# Patient Record
Sex: Female | Born: 1935 | Race: White | Hispanic: No | Smoking: Never smoker
Health system: Southern US, Community
[De-identification: ages and names within clinical notes are randomized; demographics above are authoritative.]

## PROBLEM LIST (undated history)

## (undated) DIAGNOSIS — R030 Elevated blood-pressure reading, without diagnosis of hypertension: Secondary | ICD-10-CM

## (undated) DIAGNOSIS — U071 COVID-19: Secondary | ICD-10-CM

## (undated) DIAGNOSIS — C8330 Diffuse large B-cell lymphoma, unspecified site: Secondary | ICD-10-CM

## (undated) DIAGNOSIS — H409 Unspecified glaucoma: Secondary | ICD-10-CM

## (undated) DIAGNOSIS — IMO0001 Reserved for inherently not codable concepts without codable children: Secondary | ICD-10-CM

## (undated) DIAGNOSIS — Z9289 Personal history of other medical treatment: Secondary | ICD-10-CM

## (undated) DIAGNOSIS — C801 Malignant (primary) neoplasm, unspecified: Secondary | ICD-10-CM

## (undated) DIAGNOSIS — B019 Varicella without complication: Secondary | ICD-10-CM

## (undated) HISTORY — DX: Personal history of other medical treatment: Z92.89

## (undated) HISTORY — DX: Reserved for inherently not codable concepts without codable children: IMO0001

## (undated) HISTORY — DX: Malignant (primary) neoplasm, unspecified: C80.1

## (undated) HISTORY — DX: COVID-19: U07.1

## (undated) HISTORY — DX: Varicella without complication: B01.9

## (undated) HISTORY — DX: Elevated blood-pressure reading, without diagnosis of hypertension: R03.0

## (undated) HISTORY — DX: Diffuse large B-cell lymphoma, unspecified site: C83.30

## (undated) HISTORY — DX: Unspecified glaucoma: H40.9

---

## 1976-10-12 HISTORY — PX: ABDOMINAL HYSTERECTOMY: SHX81

## 1988-10-12 HISTORY — PX: BREAST BIOPSY: SHX20

## 2004-09-01 ENCOUNTER — Ambulatory Visit: Payer: Self-pay | Admitting: General Surgery

## 2005-11-19 ENCOUNTER — Ambulatory Visit: Payer: Self-pay | Admitting: General Surgery

## 2005-12-24 ENCOUNTER — Ambulatory Visit: Payer: Self-pay | Admitting: Ophthalmology

## 2005-12-30 ENCOUNTER — Ambulatory Visit: Payer: Self-pay | Admitting: Ophthalmology

## 2006-11-23 ENCOUNTER — Ambulatory Visit: Payer: Self-pay | Admitting: General Surgery

## 2007-11-28 ENCOUNTER — Ambulatory Visit: Payer: Self-pay | Admitting: Ophthalmology

## 2007-12-21 ENCOUNTER — Ambulatory Visit: Payer: Self-pay | Admitting: Ophthalmology

## 2008-01-30 ENCOUNTER — Ambulatory Visit: Payer: Self-pay | Admitting: General Surgery

## 2009-02-18 ENCOUNTER — Ambulatory Visit: Payer: Self-pay | Admitting: General Surgery

## 2009-02-22 ENCOUNTER — Ambulatory Visit: Payer: Self-pay | Admitting: Urology

## 2009-10-12 DIAGNOSIS — C801 Malignant (primary) neoplasm, unspecified: Secondary | ICD-10-CM

## 2009-10-12 HISTORY — DX: Malignant (primary) neoplasm, unspecified: C80.1

## 2009-10-12 HISTORY — PX: TRANSURETHRAL RESECTION OF BLADDER TUMOR: SHX2575

## 2009-12-13 ENCOUNTER — Ambulatory Visit: Payer: Self-pay | Admitting: Urology

## 2009-12-16 ENCOUNTER — Ambulatory Visit: Payer: Self-pay | Admitting: Cardiovascular Disease

## 2009-12-16 ENCOUNTER — Ambulatory Visit: Payer: Self-pay | Admitting: Urology

## 2009-12-17 ENCOUNTER — Ambulatory Visit: Payer: Self-pay | Admitting: Urology

## 2010-05-13 ENCOUNTER — Ambulatory Visit: Payer: Self-pay | Admitting: Urology

## 2010-05-27 ENCOUNTER — Ambulatory Visit: Payer: Self-pay | Admitting: Urology

## 2012-11-01 DIAGNOSIS — R339 Retention of urine, unspecified: Secondary | ICD-10-CM | POA: Insufficient documentation

## 2012-11-01 DIAGNOSIS — N3941 Urge incontinence: Secondary | ICD-10-CM | POA: Insufficient documentation

## 2012-11-01 DIAGNOSIS — N182 Chronic kidney disease, stage 2 (mild): Secondary | ICD-10-CM | POA: Insufficient documentation

## 2012-11-01 DIAGNOSIS — Q644 Malformation of urachus: Secondary | ICD-10-CM | POA: Insufficient documentation

## 2012-11-01 DIAGNOSIS — N200 Calculus of kidney: Secondary | ICD-10-CM | POA: Insufficient documentation

## 2012-11-01 DIAGNOSIS — R3129 Other microscopic hematuria: Secondary | ICD-10-CM | POA: Insufficient documentation

## 2012-11-01 DIAGNOSIS — R300 Dysuria: Secondary | ICD-10-CM | POA: Insufficient documentation

## 2012-11-01 DIAGNOSIS — N390 Urinary tract infection, site not specified: Secondary | ICD-10-CM | POA: Insufficient documentation

## 2012-11-01 DIAGNOSIS — C672 Malignant neoplasm of lateral wall of bladder: Secondary | ICD-10-CM | POA: Insufficient documentation

## 2012-11-01 DIAGNOSIS — A4902 Methicillin resistant Staphylococcus aureus infection, unspecified site: Secondary | ICD-10-CM | POA: Insufficient documentation

## 2012-11-01 DIAGNOSIS — R351 Nocturia: Secondary | ICD-10-CM | POA: Insufficient documentation

## 2012-11-01 DIAGNOSIS — N3000 Acute cystitis without hematuria: Secondary | ICD-10-CM | POA: Insufficient documentation

## 2012-11-01 DIAGNOSIS — R5381 Other malaise: Secondary | ICD-10-CM | POA: Insufficient documentation

## 2012-11-01 DIAGNOSIS — R5383 Other fatigue: Secondary | ICD-10-CM | POA: Insufficient documentation

## 2014-01-10 DIAGNOSIS — Z8551 Personal history of malignant neoplasm of bladder: Secondary | ICD-10-CM | POA: Insufficient documentation

## 2015-02-27 ENCOUNTER — Other Ambulatory Visit: Payer: Self-pay

## 2015-02-27 DIAGNOSIS — R1032 Left lower quadrant pain: Secondary | ICD-10-CM

## 2015-02-27 DIAGNOSIS — R221 Localized swelling, mass and lump, neck: Secondary | ICD-10-CM

## 2015-02-28 ENCOUNTER — Inpatient Hospital Stay
Admission: RE | Admit: 2015-02-28 | Discharge: 2015-02-28 | Disposition: A | Discharge status: Home / Self Care | Payer: Self-pay | Source: Ambulatory Visit | Attending: *Deleted | Admitting: *Deleted

## 2015-02-28 ENCOUNTER — Ambulatory Visit
Admission: RE | Admit: 2015-02-28 | Discharge: 2015-02-28 | Disposition: A | Discharge status: Home / Self Care | Payer: Medicare HMO | Source: Ambulatory Visit | Attending: Internal Medicine | Admitting: Internal Medicine

## 2015-02-28 ENCOUNTER — Other Ambulatory Visit: Payer: Self-pay

## 2015-02-28 DIAGNOSIS — R221 Localized swelling, mass and lump, neck: Secondary | ICD-10-CM | POA: Diagnosis not present

## 2015-02-28 DIAGNOSIS — R1032 Left lower quadrant pain: Secondary | ICD-10-CM | POA: Diagnosis present

## 2015-03-01 ENCOUNTER — Other Ambulatory Visit: Payer: Self-pay

## 2015-03-01 DIAGNOSIS — R221 Localized swelling, mass and lump, neck: Secondary | ICD-10-CM

## 2015-03-01 DIAGNOSIS — R1030 Lower abdominal pain, unspecified: Secondary | ICD-10-CM

## 2015-03-07 ENCOUNTER — Inpatient Hospital Stay: Payer: Medicare HMO | Attending: Oncology | Admitting: Oncology

## 2015-03-07 ENCOUNTER — Encounter: Payer: Self-pay | Admitting: Oncology

## 2015-03-07 VITALS — BP 130/3 | HR 92 | Temp 95.7°F | Wt 112.0 lb

## 2015-03-07 DIAGNOSIS — M542 Cervicalgia: Secondary | ICD-10-CM | POA: Diagnosis not present

## 2015-03-07 DIAGNOSIS — R1900 Intra-abdominal and pelvic swelling, mass and lump, unspecified site: Secondary | ICD-10-CM

## 2015-03-07 DIAGNOSIS — R1032 Left lower quadrant pain: Secondary | ICD-10-CM

## 2015-03-07 DIAGNOSIS — M8580 Other specified disorders of bone density and structure, unspecified site: Secondary | ICD-10-CM | POA: Diagnosis not present

## 2015-03-07 DIAGNOSIS — K838 Other specified diseases of biliary tract: Secondary | ICD-10-CM | POA: Diagnosis not present

## 2015-03-07 DIAGNOSIS — I878 Other specified disorders of veins: Secondary | ICD-10-CM | POA: Diagnosis not present

## 2015-03-07 DIAGNOSIS — R634 Abnormal weight loss: Secondary | ICD-10-CM

## 2015-03-07 DIAGNOSIS — Z79899 Other long term (current) drug therapy: Secondary | ICD-10-CM | POA: Diagnosis not present

## 2015-03-07 DIAGNOSIS — R221 Localized swelling, mass and lump, neck: Secondary | ICD-10-CM

## 2015-03-07 DIAGNOSIS — K829 Disease of gallbladder, unspecified: Secondary | ICD-10-CM

## 2015-03-07 DIAGNOSIS — R630 Anorexia: Secondary | ICD-10-CM

## 2015-03-07 DIAGNOSIS — R599 Enlarged lymph nodes, unspecified: Secondary | ICD-10-CM | POA: Diagnosis not present

## 2015-03-07 DIAGNOSIS — R591 Generalized enlarged lymph nodes: Secondary | ICD-10-CM

## 2015-03-07 MED ORDER — HYDROCODONE-ACETAMINOPHEN 5-325 MG PO TABS: 0.5000 | ORAL_TABLET | ORAL | Status: DC | PRN

## 2015-03-07 MED ORDER — GABAPENTIN 100 MG PO CAPS: 100.0000 mg | ORAL_CAPSULE | Freq: Two times a day (BID) | ORAL | Status: DC

## 2015-03-07 NOTE — Progress Notes (Signed)
Patient here as referral from Dr. Jefm Bryant for neck nodules and abdominal mass.  Pt has never smoked and does not have living will.

## 2015-03-08 ENCOUNTER — Ambulatory Visit (INDEPENDENT_AMBULATORY_CARE_PROVIDER_SITE_OTHER): Payer: Medicare HMO | Admitting: General Surgery

## 2015-03-08 ENCOUNTER — Ambulatory Visit: Payer: Medicare HMO

## 2015-03-08 ENCOUNTER — Encounter: Payer: Self-pay | Admitting: General Surgery

## 2015-03-08 ENCOUNTER — Encounter: Payer: Self-pay | Admitting: Oncology

## 2015-03-08 ENCOUNTER — Other Ambulatory Visit (HOSPITAL_COMMUNITY)
Admission: RE | Admit: 2015-03-08 | Discharge: 2015-03-08 | Disposition: A | Discharge status: Home / Self Care | Payer: Medicare HMO | Source: Ambulatory Visit | Attending: General Surgery | Admitting: General Surgery

## 2015-03-08 VITALS — BP 118/58 | HR 88 | Resp 14 | Ht 67.0 in | Wt 111.0 lb

## 2015-03-08 DIAGNOSIS — R221 Localized swelling, mass and lump, neck: Secondary | ICD-10-CM

## 2015-03-08 NOTE — Progress Notes (Signed)
Patient ID: Lisa Sosa, female   DOB: 1923-06-21, 81 y.o.   MRN: [id number]  Chief Complaint  Patient presents with  . Rectal Problems    neck mass    HPI Lisa Sosa is a 81 y.o. female here today for a evaluation of a neck mass. Patient states no trouble swallowing. She noted a feeling of fullness in her abdomen about 4 weeks ago. Later noted a fullness in left side of neck.  She has not felt well and has had poor appetite. She had CT of neck and abdomen HPI  Past Medical History  Diagnosis Date  . Cancer 2011    gallbladder    Past Surgical History  Procedure Laterality Date  . Abdominal hysterectomy  1970  . Breast biopsy Right 1990    History reviewed. No pertinent family history.  Social History History  Substance Use Topics  . Smoking status: Never Smoker   . Smokeless tobacco: Not on file  . Alcohol Use: No    Allergies  Allergen Reactions  . Iron Nausea And Vomiting  . Codeine Rash    Current Outpatient Prescriptions  Medication Sig Dispense Refill  . Calcium Carbonate-Vitamin D (CALCIUM + D PO)     . cyanocobalamin 1000 MCG tablet Take 100 mcg by mouth every 30 (thirty) days.    Marland Kitchen gabapentin (NEURONTIN) 100 MG capsule Take 1 capsule (100 mg total) by mouth 2 (two) times daily. 60 capsule 3  . HYDROcodone-acetaminophen (NORCO/VICODIN) 5-325 MG per tablet Take 0.5 tablets by mouth every 4 (four) hours as needed for moderate pain. 30 tablet 0  . latanoprost (XALATAN) 0.005 % ophthalmic solution      No current facility-administered medications for this visit.    Review of Systems Review of Systems  Constitutional: Negative.   Respiratory: Negative.   Cardiovascular: Negative.     Blood pressure 118/58, pulse 88, resp. rate 14, height 5\' 7"  (1.702 m), weight 111 lb (50.349 kg).  Physical Exam Physical Exam  Constitutional: She is oriented to person, place, and time. She appears well-developed and well-nourished.  Eyes: Conjunctivae are normal.  No scleral icterus.  Neck:    Cardiovascular: Normal rate, regular rhythm and normal heart sounds.   Pulmonary/Chest: Breath sounds normal.  Abdominal: Soft. Bowel sounds are normal. She exhibits mass. There is no hepatomegaly. There is tenderness in the periumbilical area. No hernia.    Lymphadenopathy:    She has cervical adenopathy.       Right cervical: Deep cervical adenopathy present.  Neurological: She is alert and oriented to person, place, and time.  Skin: Skin is warm and dry.    Data Reviewed CT neck and abdomen. Large left cervical node, 9cm mesenteric mass  Assessment    Suspect Lymphoma.      Plan   Pt saw oncology-Dr. Oliva Bustard- yesterday. Core biopsy of the mass in neck performed with her consent. Tissue sent fresh to path for lymphoma workup        Yarel Kilcrease G 03/08/2015, 2:13 PM

## 2015-03-08 NOTE — Progress Notes (Signed)
[place] @ [facility] Telephone:(336) [phone]  Fax:(336) [place] OB: 1923-09-29  MR#: [id number]  JEH#:[id number]  Patient Care Team: Pcp Not In System as PCP - General Seeplaputhur [name], MD as Consulting Physician (General Surgery)  CHIEF COMPLAINT:  Chief Complaint  Patient presents with  . Lymphadenopathy    VISIT DIAGNOSIS:     ICD-9-CM ICD-10-CM   1. Neck mass 784.2 R22.1 Calcium Carbonate-Vitamin D (CALCIUM + D PO)     latanoprost (XALATAN) 0.005 % ophthalmic solution     cyanocobalamin 1000 MCG tablet     HYDROcodone-acetaminophen (NORCO/VICODIN) 5-325 MG per tablet     gabapentin (NEURONTIN) 100 MG capsule  2. Lymphadenopathy 785.6 R59.1       No history exists.    No flowsheet data found.  INTERVAL HISTORY: A 81 year old right ear was referred to me for further evaluation regarding the left supraclavicular and abdominal mass.  Patient is poor historian complains of noticing left supraclavicular mass overgrowth last few weeks.  Increasing pain on the left side of the neck.  Poor appetite.  No night sweats.  Has lost significant weight.  Patient underwent CT scan which revealed abdominal mass and left neck mass.  Patient lives in [place] but now has moved to [place] to be with her daughter  REVIEW OF SYSTEMS:   Gen. status: Elderly lady not in any acute distress Pain: Complains of pain and discomfort on the left side of the neck Lungs: No cough no shortness of breath HEENT: Swelling on the left side of the neck Cardiac: No chest pain.  No shortness of breath. Abdomen: Soft pain in the left lower quadrant.  Lower extremities no edema. Skin: No rash Neurological system no headache no dizziness Appetite is stable   As per HPI. Otherwise, a complete review of systems is negatve.  PAST MEDICAL HISTORY: Patient's past medical history includes his and had lumpectomy in the past.  Bladder surgery.  Hysterectomy bilateral cataract  surgery.   FAMILY HISTORY NO FAMILY HISTORY OF CARCINOMA OF BREAST OVARY OR COLON CANCER  GYNECOLOGIC HISTORY:  No LMP recorded.  And had a hysterectomy   ADVANCED DIRECTIVES:  patien s considering advanced healthcare directive  HEALTH MAINTENANCE: History  Substance Use Topics  . Smoking status: Never Smoker   . Smokeless tobacco: Not on file  . Alcohol Use: Not on file      Allergies  Allergen Reactions  . Iron Nausea And Vomiting  . Codeine Rash    Current Outpatient Prescriptions  Medication Sig Dispense Refill  . Calcium Carbonate-Vitamin D (CALCIUM + D PO)     . cyanocobalamin 1000 MCG tablet Take 100 mcg by mouth every 30 (thirty) days.    Marland Kitchen latanoprost (XALATAN) 0.005 % ophthalmic solution     . gabapentin (NEURONTIN) 100 MG capsule Take 1 capsule (100 mg total) by mouth 2 (two) times daily. 60 capsule 3  . HYDROcodone-acetaminophen (NORCO/VICODIN) 5-325 MG per tablet Take 0.5 tablets by mouth every 4 (four) hours as needed for moderate pain. 30 tablet 0   No current facility-administered medications for this visit.    OBJECTIVE: PHYSICAL EXAM: Gen. status: Alert and oriented lady not in any acute distress HEENT: Hard palpable mass in the left supraclavicular area Palpable left axillary lymph node.  Breast examination within normal limit.  No palpable masses there.  Lungs air entry equal on both sides dictation or rhonchi no rales.  Cardiac heart sounds are within normal limit.  Abdomen soft palpable mass.  No ascites.  Lower extremity no edema.  Neurological system no localizing or focal sign.  Higherfunctions within normal limit.  Skin: No rash. All other systems have been examined  Performance status is 01 Filed Vitals:   03/07/15 1500  BP: 130/3  Pulse: 92  Temp: 95.7 F (35.4 C)     There is no height on file to calculate BMI.    LAB RESULTS:     STUDIES: Ct Abdomen Pelvis Wo Contrast  03/01/2015   CLINICAL DATA:  LEFT lower quadrant abdominal  pain. 11 lb weight loss. History of bladder cancer with tumor or removal.  EXAM: CT ABDOMEN AND PELVIS WITHOUT CONTRAST  TECHNIQUE: Multidetector CT imaging of the abdomen and pelvis was performed following the standard protocol without IV contrast.  COMPARISON:  12/13/2009.  FINDINGS: Musculoskeletal: Osteopenia. Trace anterolisthesis of L4 on L5 which appears degenerative. No fracture or aggressive osseous lesions.  Lung Bases: Hyperlucency suggesting emphysema.  Liver: Unenhanced CT was performed per clinician order. Lack of IV contrast limits sensitivity and specificity, especially for evaluation of abdominal/pelvic solid viscera. No mass lesion.  Spleen:  Normal.  Gallbladder:  Normal.  No calcified gallstones.  Common bile duct: Chronic mild enlargement of the common bile duct measuring 9 mm. Stable enlargement of the pancreatic duct. Given the stability, no further evaluation is necessary.  Pancreas:  No gross mass lesion.  Adrenal glands: Mild thickening of the LEFT adrenal gland without a discrete mass, likely representing hyperplasia.  Kidneys: Negative for hydronephrosis. No renal obstruction. Unchanged phlebolith along the course of the mid LEFT ureter. RIGHT kidney and ureter also appear within normal limits.  Stomach:  Collapsed.  Small bowel: No small bowel obstruction. Oral contrast has progressed in to the distal small bowel and colon.  The third and fourth parts of the duodenum show thickening and there is an adjacent mesenteric mass suggesting adjacent infiltration however this is suboptimally evaluated because contrast is further in the tract.  Colon:   No obstruction or inflammatory changes.  Pelvic Genitourinary:  Urinary bladder collapsed.  Hysterectomy.  Peritoneum: No free air.  No free fluid.  Vascular/lymphatic: Mesenteric nodal mass is present, measuring 7 cm AP by 7 cm transverse. Craniocaudal, this measures 9.3 cm. This likely represents lymphoma. Left-sided retroperitoneal adenopathy  is present in the periaortic region. 2 cm x 3 cm nodal mass near the aortic hiatus.  Body Wall: No inguinal adenopathy.  No hernia.  IMPRESSION: 1. Mesenteric nodal mass and retroperitoneal adenopathy is likely secondary to lymphoma. Metastatic disease is in the differential considerations. 2. Thickening of the third and fourth parts of the duodenum may represent adjacent invasion however this area is suboptimally evaluated. 3. Stable chronic dilation of the common bile duct and pancreatic duct. 4. Bladder suboptimally evaluated for recurrence because of collapse and no IV contrast.   Electronically Signed   By: Dereck Ligas M.D.   On: 03/01/2015 09:20   Ct Soft Tissue Neck Wo Contrast  02/28/2015   CLINICAL DATA:  Left neck mass.  Bladder cancer 2011.  EXAM: CT NECK WITHOUT CONTRAST  TECHNIQUE: Multidetector CT imaging of the neck was performed following the standard protocol without intravenous contrast.  COMPARISON:  None.  FINDINGS: Intravenous contrast not administered secondary to GFR of 29.  Large soft tissue mass in the left lower neck, supraclavicular region. This mass measures 47 x 46 mm on axial images and appears to represent an enlarged lymph node mass lateral to the  left carotid artery and lateral to the thyroid. No associated calcifications. No axillary adenopathy. No adenopathy in the right neck.  Normal pharynx and larynx.  Parotid and submandibular glands normal.  Thyroid normal.  Lung apices are clear without infiltrate effusion or mass.  Cervical disc degeneration and spondylosis at multiple levels. No evidence of skeletal lesion.  IMPRESSION: 47 x 46 mm mass in the left lower neck compatible with malignant adenopathy for metastatic disease. No other adenopathy or mass in the neck.   Electronically Signed   By: Franchot Gallo M.D.   On: 02/28/2015 16:54    ASSESSMENT:  Patient had extensive lymphadenopathy with left supraclavicular area as well as abdominal mass Previous history of  carcinoma of bladder  PLAN:   CT scan has been reviewed independently and soles abdominal mass.  An elapsed neck mass. All of the lab data will be reviewed.  We are trying to uptake in all the information from primary care physician. Proceed with core biopsy of the left neck mass.  Appointment has been made with Dr. Jamal Collin Patient will be evaluated after biopsy results are available.  Appointment has been made next week for further discussion and treatment Pain Patient had a pain in the left supraclavicular area.  Neurontin was started.  100 mg twice a day for an Vicodin was given for pain  Patient expressed understanding and was in agreement with this plan. She also understands that She can call clinic at any time with any questions, concerns, or complaints.    No matching staging information was found for the patient.  Forest Gleason, MD   03/08/2015 7:41 AM

## 2015-03-13 ENCOUNTER — Ambulatory Visit: Payer: Medicare HMO

## 2015-03-14 ENCOUNTER — Inpatient Hospital Stay: Payer: Medicare HMO | Attending: Oncology | Admitting: Oncology

## 2015-03-14 VITALS — BP 137/66 | HR 93 | Temp 95.4°F | Wt 114.2 lb

## 2015-03-14 DIAGNOSIS — R599 Enlarged lymph nodes, unspecified: Secondary | ICD-10-CM | POA: Diagnosis not present

## 2015-03-14 DIAGNOSIS — M5030 Other cervical disc degeneration, unspecified cervical region: Secondary | ICD-10-CM | POA: Insufficient documentation

## 2015-03-14 DIAGNOSIS — R1032 Left lower quadrant pain: Secondary | ICD-10-CM | POA: Insufficient documentation

## 2015-03-14 DIAGNOSIS — C8331 Diffuse large B-cell lymphoma, lymph nodes of head, face, and neck: Secondary | ICD-10-CM | POA: Diagnosis not present

## 2015-03-14 DIAGNOSIS — Z8551 Personal history of malignant neoplasm of bladder: Secondary | ICD-10-CM | POA: Insufficient documentation

## 2015-03-14 DIAGNOSIS — Z5111 Encounter for antineoplastic chemotherapy: Secondary | ICD-10-CM | POA: Diagnosis present

## 2015-03-14 DIAGNOSIS — M8580 Other specified disorders of bone density and structure, unspecified site: Secondary | ICD-10-CM | POA: Insufficient documentation

## 2015-03-14 DIAGNOSIS — C8330 Diffuse large B-cell lymphoma, unspecified site: Secondary | ICD-10-CM

## 2015-03-14 DIAGNOSIS — Z79899 Other long term (current) drug therapy: Secondary | ICD-10-CM | POA: Diagnosis not present

## 2015-03-14 DIAGNOSIS — M542 Cervicalgia: Secondary | ICD-10-CM | POA: Insufficient documentation

## 2015-03-14 DIAGNOSIS — R1900 Intra-abdominal and pelvic swelling, mass and lump, unspecified site: Secondary | ICD-10-CM | POA: Insufficient documentation

## 2015-03-14 DIAGNOSIS — R221 Localized swelling, mass and lump, neck: Secondary | ICD-10-CM

## 2015-03-14 DIAGNOSIS — R630 Anorexia: Secondary | ICD-10-CM | POA: Insufficient documentation

## 2015-03-14 MED ORDER — PREDNISONE 10 MG PO TABS: ORAL_TABLET | ORAL | Status: DC

## 2015-03-14 MED ORDER — HYDROCODONE-ACETAMINOPHEN 5-325 MG PO TABS: 0.5000 | ORAL_TABLET | ORAL | Status: DC | PRN

## 2015-03-14 NOTE — Progress Notes (Signed)
Patient never smoked.  Does have healthcare POA.

## 2015-03-17 ENCOUNTER — Encounter: Payer: Self-pay | Admitting: Oncology

## 2015-03-17 DIAGNOSIS — C8330 Diffuse large B-cell lymphoma, unspecified site: Secondary | ICD-10-CM

## 2015-03-17 HISTORY — DX: Diffuse large B-cell lymphoma, unspecified site: C83.30

## 2015-03-17 NOTE — Progress Notes (Signed)
[place] @ [facility] Telephone:(336) [phone]  Fax:(336) [place] OB: 1922/10/23  MR#: [id number]  LTJ#:[id number]  Patient Care Team: [facility] as PCP - General Seeplaputhur [name], MD as Consulting Physician (General Surgery) Provider Not In [place]:  Chief Complaint  Patient presents with  . Follow-up    VISIT DIAGNOSIS:     ICD-9-CM ICD-10-CM   1. Neck mass 784.2 R22.1 predniSONE (DELTASONE) 10 MG tablet     HYDROcodone-acetaminophen (NORCO/VICODIN) 5-325 MG per tablet     CBC with Differential     Comprehensive metabolic panel  2. 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  81 year old lady presented with large left neck mass and abdominal palpable mass.  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  INTERVAL HISTORY: A 81 year old right ear was referred to me for further evaluation regarding the left supraclavicular and abdominal mass.  Patient is poor historian complains of noticing left supraclavicular mass overgrowth last few weeks.  Increasing pain on the left side of the neck.  Poor appetite.  No night sweats.  Has lost significant weight.  Patient underwent CT scan which revealed abdominal mass and left neck mass.  Patient lives in [place] but now has moved to [place] to be with her daughter. March 14, 2015 Patient is here to discuss the results of the biopsy.  Continues to have severe neck pain.  Pain was radiating down to left upper extremity. Biopsies positive for diffuse large B-cell lymphoma Because of pain patient cannot sleep well.  Poor appetite.  REVIEW OF SYSTEMS:   Gen. status: Elderly lady in no acute distress because of pain in the  left side of the neck Pain: Complains of pain and discomfort on the left side of the neck Lungs: No cough no shortness of breath HEENT: Swelling on the left side of the neck Cardiac: No chest pain.  No shortness of breath. Abdomen: Soft pain in the left lower quadrant.  Lower extremities no edema. Skin: No rash Neurological system no headache no dizziness Appetite is stable  Pain Assesment Patient has modeate      pain. Narcotics has been prescribed. Instructions regarding managing constipation was given. As per HPI. Otherwise, a complete review of systems is negatve.  PAST MEDICAL HISTORY: Patient's past medical history includes his and had lumpectomy in the past.  Bladder surgery.  Hysterectomy bilateral cataract surgery.   FAMILY HISTORY NO FAMILY HISTORY OF CARCINOMA OF BREAST OVARY OR COLON CANCER  GYNECOLOGIC HISTORY:  No LMP recorded. Patient has had a hysterectomy.  And had a hysterectomy   ADVANCED DIRECTIVES:  patien s considering advanced healthcare directive  HEALTH MAINTENANCE: History  Substance Use Topics  . Smoking status: Never Smoker   . Smokeless tobacco: Not on file  . Alcohol Use: No      Allergies  Allergen Reactions  . Iron Nausea And Vomiting  . Codeine Rash    Current Outpatient Prescriptions  Medication Sig Dispense Refill  . cyanocobalamin 1000 MCG tablet Take 100 mcg by mouth every 30 (thirty) days.    Marland Kitchen HYDROcodone-acetaminophen (NORCO/VICODIN) 5-325 MG per tablet Take 0.5 tablets  by mouth every 4 (four) hours as needed for moderate pain. 30 tablet 0  . latanoprost (XALATAN) 0.005 % ophthalmic solution     . Calcium Carbonate-Vitamin D (CALCIUM + D PO)     . gabapentin (NEURONTIN) 100 MG capsule Take 1 capsule (100 mg total) by mouth 2 (two) times daily. (Patient not taking: Reported on 03/14/2015) 60 capsule 3  . predniSONE (DELTASONE) 10 MG tablet 6 tabs po x 5 days, then 5 tabs po x 1 day, 4 tabs po x 1 day, 3 tabs po x 1 day, 2 tabs po  x 1 day, 1 tab po x 1 day, then stop 45 tablet 0   No current facility-administered medications for this visit.    OBJECTIVE: PHYSICAL EXAM: Gen. status: Alert and oriented lady not in any acute distress HEENT: Hard palpable mass in the left supraclavicular area Palpable left axillary lymph node.  Breast examination within normal limit.  No palpable masses there.  Lungs air entry equal on both sides dictation or rhonchi no rales.  Cardiac heart sounds are within normal limit.  Abdomen soft palpable mass.  No ascites.  Lower extremity no edema.  Neurological system no localizing or focal sign.  Higherfunctions within normal limit.  Skin: No rash. All other systems have been examined  Palpable mass in the left side of the neck is increased in size.  Abdomen palpable masses increased in size Patient has lymphedema in left upper extremity  Performance status is 01 Filed Vitals:   03/14/15 1126  BP: 137/66  Pulse: 93  Temp: 95.4 F (35.2 C)     Body mass index is 17.88 kg/(m^2).    LAB RESULTS:     STUDIES: Ct Abdomen Pelvis Wo Contrast  03/01/2015   CLINICAL DATA:  LEFT lower quadrant abdominal pain. 11 lb weight loss. History of bladder cancer with tumor or removal.  EXAM: CT ABDOMEN AND PELVIS WITHOUT CONTRAST  TECHNIQUE: Multidetector CT imaging of the abdomen and pelvis was performed following the standard protocol without IV contrast.  COMPARISON:  12/13/2009.  FINDINGS: Musculoskeletal: Osteopenia. Trace anterolisthesis of L4 on L5 which appears degenerative. No fracture or aggressive osseous lesions.  Lung Bases: Hyperlucency suggesting emphysema.  Liver: Unenhanced CT was performed per clinician order. Lack of IV contrast limits sensitivity and specificity, especially for evaluation of abdominal/pelvic solid viscera. No mass lesion.  Spleen:  Normal.  Gallbladder:  Normal.  No calcified gallstones.  Common bile duct: Chronic mild enlargement of the common bile duct measuring 9 mm.  Stable enlargement of the pancreatic duct. Given the stability, no further evaluation is necessary.  Pancreas:  No gross mass lesion.  Adrenal glands: Mild thickening of the LEFT adrenal gland without a discrete mass, likely representing hyperplasia.  Kidneys: Negative for hydronephrosis. No renal obstruction. Unchanged phlebolith along the course of the mid LEFT ureter. RIGHT kidney and ureter also appear within normal limits.  Stomach:  Collapsed.  Small bowel: No small bowel obstruction. Oral contrast has progressed in to the distal small bowel and colon.  The third and fourth parts of the duodenum show thickening and there is an adjacent mesenteric mass suggesting adjacent infiltration however this is suboptimally evaluated because contrast is further in the tract.  Colon:   No obstruction or inflammatory changes.  Pelvic Genitourinary:  Urinary bladder collapsed.  Hysterectomy.  Peritoneum: No free air.  No free fluid.  Vascular/lymphatic: Mesenteric nodal mass is present, measuring 7 cm AP by 7 cm transverse. Craniocaudal, this measures  9.3 cm. This likely represents lymphoma. Left-sided retroperitoneal adenopathy is present in the periaortic region. 2 cm x 3 cm nodal mass near the aortic hiatus.  Body Wall: No inguinal adenopathy.  No hernia.  IMPRESSION: 1. Mesenteric nodal mass and retroperitoneal adenopathy is likely secondary to lymphoma. Metastatic disease is in the differential considerations. 2. Thickening of the third and fourth parts of the duodenum may represent adjacent invasion however this area is suboptimally evaluated. 3. Stable chronic dilation of the common bile duct and pancreatic duct. 4. Bladder suboptimally evaluated for recurrence because of collapse and no IV contrast.   Electronically Signed   By: Dereck Ligas M.D.   On: 03/01/2015 09:20   Ct Soft Tissue Neck Wo Contrast  02/28/2015   CLINICAL DATA:  Left neck mass.  Bladder cancer 2011.  EXAM: CT NECK WITHOUT CONTRAST   TECHNIQUE: Multidetector CT imaging of the neck was performed following the standard protocol without intravenous contrast.  COMPARISON:  None.  FINDINGS: Intravenous contrast not administered secondary to GFR of 29.  Large soft tissue mass in the left lower neck, supraclavicular region. This mass measures 47 x 46 mm on axial images and appears to represent an enlarged lymph node mass lateral to the left carotid artery and lateral to the thyroid. No associated calcifications. No axillary adenopathy. No adenopathy in the right neck.  Normal pharynx and larynx.  Parotid and submandibular glands normal.  Thyroid normal.  Lung apices are clear without infiltrate effusion or mass.  Cervical disc degeneration and spondylosis at multiple levels. No evidence of skeletal lesion.  IMPRESSION: 47 x 46 mm mass in the left lower neck compatible with malignant adenopathy for metastatic disease. No other adenopathy or mass in the neck.   Electronically Signed   By: Franchot Gallo M.D.   On: 02/28/2015 16:54    ASSESSMENT:  Patient had extensive lymphadenopathy with left supraclavicular area as well as abdominal mass Biopsies positive for diffuse B large cell lymphoma,  stage III Previous history of carcinoma of bladder,  PLAN:   HE has been reviewed.  Diagnosis has been discussed with the patient and family.  We discussed various options including palliative radiation therapy palliative chemotherapy patient is not interested in any of the definitive treatment at present time Possibility of starting patient on prednisone proceed with her pain gets better possibility of considering adding rituximab and later on bendamustine all those options were discussed with the patient and family. Bonnita Nasuti number of questions. Because of patient's strong desire not to get into chemotherapy at present time prednisone 60 mg daily for 5 days and then taper by 10 mg 20 was ordered. Patient will be evaluated for possibility of adding  rituximab Intent of such treatments are palliative in nature as patient cannot tolerate any aggressive chemotherapy nor C would like to undergo any aggressive chemotherapy Total duration of visit was 45 minutes.  50% or more time was spent in counseling patient and family regarding prognosis and options of treatment and available resources. Also discuss ed hospice care Pain Patient had a pain in the left supraclavicular area.  Neurontin was started.  100 mg twice a day for an Vicodin was given for pain  Patient expressed understanding and was in agreement with this plan. She also understands that She can call clinic at any time with any questions, concerns, or complaints.    No matching staging information was found for the patient.  Forest Gleason, MD   03/17/2015 8:22 AM

## 2015-03-18 ENCOUNTER — Telehealth: Payer: Self-pay | Admitting: *Deleted

## 2015-03-18 NOTE — Telephone Encounter (Signed)
Called to report that after being on the prednisone, the neck and place in her abd have gone down and her pain has decreased too

## 2015-03-26 ENCOUNTER — Other Ambulatory Visit: Payer: Self-pay | Admitting: Family Medicine

## 2015-03-27 ENCOUNTER — Encounter: Payer: Self-pay | Admitting: Oncology

## 2015-03-27 ENCOUNTER — Inpatient Hospital Stay: Payer: Medicare HMO

## 2015-03-27 ENCOUNTER — Inpatient Hospital Stay (HOSPITAL_BASED_OUTPATIENT_CLINIC_OR_DEPARTMENT_OTHER): Payer: Medicare HMO | Admitting: Family Medicine

## 2015-03-27 ENCOUNTER — Ambulatory Visit: Payer: Medicare HMO

## 2015-03-27 VITALS — BP 129/76 | HR 76 | Temp 96.1°F | Wt 101.2 lb

## 2015-03-27 VITALS — BP 108/68 | HR 81

## 2015-03-27 DIAGNOSIS — Z5111 Encounter for antineoplastic chemotherapy: Secondary | ICD-10-CM | POA: Diagnosis not present

## 2015-03-27 DIAGNOSIS — R630 Anorexia: Secondary | ICD-10-CM

## 2015-03-27 DIAGNOSIS — R599 Enlarged lymph nodes, unspecified: Secondary | ICD-10-CM

## 2015-03-27 DIAGNOSIS — Z79899 Other long term (current) drug therapy: Secondary | ICD-10-CM | POA: Diagnosis not present

## 2015-03-27 DIAGNOSIS — M8580 Other specified disorders of bone density and structure, unspecified site: Secondary | ICD-10-CM

## 2015-03-27 DIAGNOSIS — M5030 Other cervical disc degeneration, unspecified cervical region: Secondary | ICD-10-CM

## 2015-03-27 DIAGNOSIS — R1900 Intra-abdominal and pelvic swelling, mass and lump, unspecified site: Secondary | ICD-10-CM | POA: Diagnosis not present

## 2015-03-27 DIAGNOSIS — Z8551 Personal history of malignant neoplasm of bladder: Secondary | ICD-10-CM

## 2015-03-27 DIAGNOSIS — C8330 Diffuse large B-cell lymphoma, unspecified site: Secondary | ICD-10-CM

## 2015-03-27 DIAGNOSIS — M542 Cervicalgia: Secondary | ICD-10-CM

## 2015-03-27 DIAGNOSIS — R221 Localized swelling, mass and lump, neck: Secondary | ICD-10-CM

## 2015-03-27 DIAGNOSIS — R1032 Left lower quadrant pain: Secondary | ICD-10-CM

## 2015-03-27 DIAGNOSIS — C8331 Diffuse large B-cell lymphoma, lymph nodes of head, face, and neck: Secondary | ICD-10-CM

## 2015-03-27 LAB — CBC WITH DIFFERENTIAL/PLATELET
Basophils Absolute: 0 10*3/uL (ref 0–0.1)
Basophils Relative: 0 %
Eosinophils Absolute: 0.1 10*3/uL (ref 0–0.7)
Eosinophils Relative: 2 %
HCT: 31.4 % — ABNORMAL LOW (ref 35.0–47.0)
Hemoglobin: 10.2 g/dL — ABNORMAL LOW (ref 12.0–16.0)
LYMPHS PCT: 11 %
Lymphs Abs: 0.9 10*3/uL — ABNORMAL LOW (ref 1.0–3.6)
MCH: 29.7 pg (ref 26.0–34.0)
MCHC: 32.4 g/dL (ref 32.0–36.0)
MCV: 91.6 fL (ref 80.0–100.0)
MONO ABS: 0.6 10*3/uL (ref 0.2–0.9)
Monocytes Relative: 8 %
NEUTROS ABS: 6.3 10*3/uL (ref 1.4–6.5)
Neutrophils Relative %: 79 %
PLATELETS: 191 10*3/uL (ref 150–440)
RBC: 3.43 MIL/uL — ABNORMAL LOW (ref 3.80–5.20)
RDW: 15.6 % — ABNORMAL HIGH (ref 11.5–14.5)
WBC: 8 10*3/uL (ref 3.6–11.0)

## 2015-03-27 LAB — COMPREHENSIVE METABOLIC PANEL
ALK PHOS: 61 U/L (ref 38–126)
ALT: 53 U/L (ref 14–54)
AST: 31 U/L (ref 15–41)
Albumin: 3.8 g/dL (ref 3.5–5.0)
Anion gap: 5 (ref 5–15)
BILIRUBIN TOTAL: 0.7 mg/dL (ref 0.3–1.2)
BUN: 30 mg/dL — ABNORMAL HIGH (ref 6–20)
CHLORIDE: 104 mmol/L (ref 101–111)
CO2: 31 mmol/L (ref 22–32)
Calcium: 9.3 mg/dL (ref 8.9–10.3)
Creatinine, Ser: 1.32 mg/dL — ABNORMAL HIGH (ref 0.44–1.00)
GFR calc non Af Amer: 35 mL/min — ABNORMAL LOW (ref >60)
GFR, EST AFRICAN AMERICAN: 40 mL/min — AB (ref >60)
GLUCOSE: 87 mg/dL (ref 65–99)
POTASSIUM: 4.6 mmol/L (ref 3.5–5.1)
Sodium: 140 mmol/L (ref 135–145)
Total Protein: 6.4 g/dL — ABNORMAL LOW (ref 6.5–8.1)

## 2015-03-27 MED ORDER — DIPHENHYDRAMINE HCL 25 MG PO CAPS
50.0000 mg | ORAL_CAPSULE | Freq: Once | ORAL | Status: AC
  Administered 2015-03-27: 50 mg via ORAL
  Filled 2015-03-27: qty 2

## 2015-03-27 MED ORDER — ACETAMINOPHEN 325 MG PO TABS
650.0000 mg | ORAL_TABLET | Freq: Once | ORAL | Status: AC
  Administered 2015-03-27: 650 mg via ORAL
  Filled 2015-03-27: qty 2

## 2015-03-27 MED ORDER — SODIUM CHLORIDE 0.9 % IV SOLN
Freq: Once | INTRAVENOUS | Status: AC
  Administered 2015-03-27: 10:00:00 via INTRAVENOUS
  Filled 2015-03-27: qty 250

## 2015-03-27 MED ORDER — SODIUM CHLORIDE 0.9 % IV SOLN
375.0000 mg/m2 | Freq: Once | INTRAVENOUS | Status: AC
  Administered 2015-03-27: 600 mg via INTRAVENOUS
  Filled 2015-03-27: qty 50

## 2015-03-27 MED ORDER — METHYLPREDNISOLONE SODIUM SUCC 125 MG IJ SOLR
125.0000 mg | Freq: Once | INTRAMUSCULAR | Status: AC | PRN
  Administered 2015-03-27: 125 mg via INTRAVENOUS

## 2015-03-27 NOTE — Progress Notes (Signed)
[place]  [phone]) [phone]  Fax:(336) [phone]     Lisa Sosa DOB: [birth year]  MR#: [id number]  NHA#:[id number]  Patient Care Team: [facility] as PCP - General Seeplaputhur [name], MD as Consulting Physician (General Surgery) Provider Not In [place]:  Chief Complaint  Patient presents with  . Follow-up   42. 81 year old lady presented with large left neck mass and abdominal palpable mass. Biopsy of left neck mass is being positive for diffuse B large cell lymphoma. Clinically staged as stage III (June, 2016)  INTERVAL HISTORY:  Patient is here today for further follow-up in regards to left supraclavicular neck mass, positive for diffuse large B-cell lymphoma. She has been on prednisone and reports that swelling has greatly improved in the left neck, her appetite is improved. She overall is feeling fairly well today and is agreeable to initiation of Rituxan IV therapy.  REVIEW OF SYSTEMS:   Review of Systems  Constitutional: Negative for fever, chills, weight loss, malaise/fatigue and diaphoresis.  HENT: Negative for congestion, ear discharge, ear pain, hearing loss, nosebleeds, sore throat and tinnitus.        Left cervical lymph node has improved with prednisone  Eyes: Negative for blurred vision, double vision, photophobia, pain, discharge and redness.  Respiratory: Negative for cough, hemoptysis, sputum production, shortness of breath, wheezing and stridor.   Cardiovascular: Negative for chest pain, palpitations, orthopnea, claudication, leg swelling and PND.  Gastrointestinal: Negative for heartburn, nausea, vomiting, abdominal pain, diarrhea, constipation, blood in stool and melena.  Genitourinary: Negative.   Musculoskeletal: Negative.   Skin: Negative.   Neurological: Negative for dizziness, tingling, focal weakness, seizures, weakness and headaches.  Endo/Heme/Allergies: Does not bruise/bleed easily.    Psychiatric/Behavioral: Negative for depression. The patient is not nervous/anxious and does not have insomnia.     As per HPI. Otherwise, a complete review of systems is negatve.  ONCOLOGY HISTORY: Oncology History   51.  81 year old lady presented with large left neck mass and abdominal palpable mass.  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 Cancer 2011    gallbladder  . Diffuse large B cell lymphoma 03/17/2015    PAST SURGICAL HISTORY: Past Surgical History  Procedure Laterality Date  . Abdominal hysterectomy  1970  . Breast biopsy Right 1990    FAMILY HISTORY No family history on file.  GYNECOLOGIC HISTORY:  No LMP recorded. Patient has had a hysterectomy.     ADVANCED DIRECTIVES:    HEALTH MAINTENANCE: History  Substance Use Topics  . Smoking status: Never Smoker   . Smokeless tobacco: Not on file  . Alcohol Use: No     Colonoscopy:  PAP:  Bone density:  Lipid panel:  Allergies  Allergen Reactions  . Iron Nausea And Vomiting  . Codeine Rash    Current Outpatient Prescriptions  Medication Sig Dispense Refill  . Calcium Carbonate-Vitamin D (CALCIUM + D PO)     . cyanocobalamin 1000 MCG tablet Take 100 mcg by mouth every 30 (thirty) days.    Marland Kitchen HYDROcodone-acetaminophen (NORCO/VICODIN) 5-325 MG per tablet Take 0.5 tablets by mouth every 4 (four) hours as needed for moderate pain. 30 tablet 0  .  latanoprost (XALATAN) 0.005 % ophthalmic solution     . gabapentin (NEURONTIN) 100 MG capsule Take 1 capsule (100 mg total) by mouth 2 (two) times daily. (Patient not taking: Reported on 03/27/2015) 60 capsule 3  . predniSONE (DELTASONE) 10 MG tablet 6 tabs po x 5 days, then 5 tabs po x 1 day, 4 tabs po  x 1 day, 3 tabs po x 1 day, 2 tabs po x 1 day, 1 tab po x 1 day, then stop (Patient not taking: Reported on 03/27/2015) 45 tablet 0   No current facility-administered medications for this visit.    OBJECTIVE: BP 129/76 mmHg  Pulse 76  Temp(Src) 96.1 F (35.6 C) (Tympanic)  Wt 101 lb 3.1 oz (45.9 kg)   Body mass index is 15.85 kg/(m^2).    ECOG FS:1 - Symptomatic but completely ambulatory  General: Well-developed, well-nourished, no acute distress. Eyes: Pink conjunctiva, anicteric sclera. HEENT: Normocephalic, moist mucous membranes, clear oropharnyx. Lungs: Clear to auscultation bilaterally. Heart: Regular rate and rhythm. No rubs, murmurs, or gallops. Abdomen: Soft, nontender, nondistended. No organomegaly noted, normoactive bowel sounds. Musculoskeletal: No edema, cyanosis, or clubbing. Neuro: Alert, answering all questions appropriately. Cranial nerves grossly intact. Skin: No rashes or petechiae noted. Psych: Normal affect. Lymphatics: Left cervical lymph node enlargement has greatly improved. No other lymphadenopathy   LAB RESULTS:     Component Value Date/Time   NA 140 03/27/2015 0848   K 4.6 03/27/2015 0848   CL 104 03/27/2015 0848   CO2 31 03/27/2015 0848   GLUCOSE 87 03/27/2015 0848   BUN 30* 03/27/2015 0848   CREATININE 1.32* 03/27/2015 0848   CALCIUM 9.3 03/27/2015 0848   PROT 6.4* 03/27/2015 0848   ALBUMIN 3.8 03/27/2015 0848   AST 31 03/27/2015 0848   ALT 53 03/27/2015 0848   ALKPHOS 61 03/27/2015 0848   BILITOT 0.7 03/27/2015 0848   GFRNONAA 35* 03/27/2015 0848   GFRAA 40* 03/27/2015 0848    No results found for: SPEP, UPEP  Lab Results  Component Value Date   WBC 8.0 03/27/2015   NEUTROABS 6.3 03/27/2015   HGB 10.2* 03/27/2015   HCT 31.4* 03/27/2015   MCV 91.6 03/27/2015   PLT 191 03/27/2015      Chemistry      Component Value Date/Time   NA 140 03/27/2015 0848   K 4.6 03/27/2015 0848   CL 104 03/27/2015 0848   CO2 31 03/27/2015 0848    BUN 30* 03/27/2015 0848   CREATININE 1.32* 03/27/2015 0848      Component Value Date/Time   CALCIUM 9.3 03/27/2015 0848   ALKPHOS 61 03/27/2015 0848   AST 31 03/27/2015 0848   ALT 53 03/27/2015 0848   BILITOT 0.7 03/27/2015 0848       No results found for: LABCA2  No components found for: LABCA125  No results for input(s): INR in the last 168 hours.  No results found for: COLORURINE, APPEARANCEUR, LABSPEC, PHURINE, GLUCOSEU, HGBUR, BILIRUBINUR, KETONESUR, PROTEINUR, UROBILINOGEN, NITRITE, LEUKOCYTESUR  STUDIES: Ct Abdomen Pelvis Wo Contrast  03/01/2015   CLINICAL DATA:  LEFT lower quadrant abdominal pain. 11 lb weight loss. History of bladder cancer with tumor or removal.  EXAM: CT ABDOMEN AND PELVIS WITHOUT CONTRAST  TECHNIQUE: Multidetector CT imaging of the abdomen and pelvis was performed following the standard protocol without IV contrast.  COMPARISON:  12/13/2009.  FINDINGS: Musculoskeletal: Osteopenia. Trace anterolisthesis of L4 on L5 which appears degenerative. No fracture or aggressive osseous lesions.  Lung Bases: Hyperlucency suggesting emphysema.  Liver: Unenhanced CT was performed per clinician order. Lack of IV contrast limits sensitivity and specificity, especially for evaluation of abdominal/pelvic solid viscera. No mass lesion.  Spleen:  Normal.  Gallbladder:  Normal.  No calcified gallstones.  Common bile duct: Chronic mild enlargement of the common bile duct measuring 9 mm. Stable enlargement of the pancreatic duct. Given the stability, no further evaluation is necessary.  Pancreas:  No gross mass lesion.  Adrenal glands: Mild thickening of the LEFT adrenal gland without a discrete mass, likely representing hyperplasia.  Kidneys: Negative for hydronephrosis. No renal obstruction. Unchanged phlebolith along the course of the mid LEFT ureter. RIGHT kidney and ureter also appear within normal limits.  Stomach:  Collapsed.  Small bowel: No small bowel obstruction. Oral contrast  has progressed in to the distal small bowel and colon.  The third and fourth parts of the duodenum show thickening and there is an adjacent mesenteric mass suggesting adjacent infiltration however this is suboptimally evaluated because contrast is further in the tract.  Colon:   No obstruction or inflammatory changes.  Pelvic Genitourinary:  Urinary bladder collapsed.  Hysterectomy.  Peritoneum: No free air.  No free fluid.  Vascular/lymphatic: Mesenteric nodal mass is present, measuring 7 cm AP by 7 cm transverse. Craniocaudal, this measures 9.3 cm. This likely represents lymphoma. Left-sided retroperitoneal adenopathy is present in the periaortic region. 2 cm x 3 cm nodal mass near the aortic hiatus.  Body Wall: No inguinal adenopathy.  No hernia.  IMPRESSION: 1. Mesenteric nodal mass and retroperitoneal adenopathy is likely secondary to lymphoma. Metastatic disease is in the differential considerations. 2. Thickening of the third and fourth parts of the duodenum may represent adjacent invasion however this area is suboptimally evaluated. 3. Stable chronic dilation of the common bile duct and pancreatic duct. 4. Bladder suboptimally evaluated for recurrence because of collapse and no IV contrast.   Electronically Signed   By: Dereck Ligas M.D.   On: 03/01/2015 09:20   Ct Soft Tissue Neck Wo Contrast  02/28/2015   CLINICAL DATA:  Left neck mass.  Bladder cancer 2011.  EXAM: CT NECK WITHOUT CONTRAST  TECHNIQUE: Multidetector CT imaging of the neck was performed following the standard protocol without intravenous contrast.  COMPARISON:  None.  FINDINGS: Intravenous contrast not administered secondary to GFR of 29.  Large soft tissue mass in the left lower neck, supraclavicular region. This mass measures 47 x 46 mm on axial images and appears to represent an enlarged lymph node mass lateral to the left carotid artery and lateral to the thyroid. No associated calcifications. No axillary adenopathy. No adenopathy  in the right neck.  Normal pharynx and larynx.  Parotid and submandibular glands normal.  Thyroid normal.  Lung apices are clear without infiltrate effusion or mass.  Cervical disc degeneration and spondylosis at multiple levels. No evidence of skeletal lesion.  IMPRESSION: 47 x 46 mm mass in the left lower neck compatible with malignant adenopathy for metastatic disease. No other adenopathy or mass in the neck.   Electronically Signed   By: Franchot Gallo M.D.   On: 02/28/2015 16:54    ASSESSMENT:  Diffuse large B-cell lymphoma  PLAN:   1. Diffuse large B-cell lymphoma. Following use of prednisone daily left supraclavicular lymphadenopathy have resolved. As per discussion with Dr. Oliva Bustard will initiate Rituxan therapy 375 mg/m. Patient is agreeable, education provided to patient and family. Also per discussion with Dr. Oliva Bustard will have patient come back in to office in 1 week  for the possible addition of bendamustine and continuation of Rituxan.  Patient expressed understanding and was in agreement with this plan. She also understands that She can call clinic at any time with any questions, concerns, or complaints.     Evlyn Kanner, NP   03/27/2015 9:40 AM

## 2015-04-03 ENCOUNTER — Inpatient Hospital Stay: Payer: Medicare HMO

## 2015-04-03 ENCOUNTER — Inpatient Hospital Stay (HOSPITAL_BASED_OUTPATIENT_CLINIC_OR_DEPARTMENT_OTHER): Payer: Medicare HMO | Admitting: Oncology

## 2015-04-03 VITALS — BP 117/63 | HR 65 | Temp 95.8°F

## 2015-04-03 VITALS — BP 120/74 | HR 91 | Temp 96.8°F | Wt 108.7 lb

## 2015-04-03 DIAGNOSIS — Z79899 Other long term (current) drug therapy: Secondary | ICD-10-CM

## 2015-04-03 DIAGNOSIS — C8330 Diffuse large B-cell lymphoma, unspecified site: Secondary | ICD-10-CM

## 2015-04-03 DIAGNOSIS — R1900 Intra-abdominal and pelvic swelling, mass and lump, unspecified site: Secondary | ICD-10-CM

## 2015-04-03 DIAGNOSIS — Z5111 Encounter for antineoplastic chemotherapy: Secondary | ICD-10-CM | POA: Diagnosis not present

## 2015-04-03 DIAGNOSIS — M542 Cervicalgia: Secondary | ICD-10-CM

## 2015-04-03 DIAGNOSIS — C8331 Diffuse large B-cell lymphoma, lymph nodes of head, face, and neck: Secondary | ICD-10-CM | POA: Diagnosis not present

## 2015-04-03 DIAGNOSIS — M5030 Other cervical disc degeneration, unspecified cervical region: Secondary | ICD-10-CM

## 2015-04-03 DIAGNOSIS — R599 Enlarged lymph nodes, unspecified: Secondary | ICD-10-CM

## 2015-04-03 DIAGNOSIS — M8580 Other specified disorders of bone density and structure, unspecified site: Secondary | ICD-10-CM

## 2015-04-03 DIAGNOSIS — R630 Anorexia: Secondary | ICD-10-CM

## 2015-04-03 DIAGNOSIS — R1032 Left lower quadrant pain: Secondary | ICD-10-CM

## 2015-04-03 DIAGNOSIS — Z8551 Personal history of malignant neoplasm of bladder: Secondary | ICD-10-CM

## 2015-04-03 LAB — CBC WITH DIFFERENTIAL/PLATELET
Basophils Absolute: 0 10*3/uL (ref 0–0.1)
Basophils Relative: 1 %
EOS ABS: 0.1 10*3/uL (ref 0–0.7)
Eosinophils Relative: 4 %
HCT: 30.2 % — ABNORMAL LOW (ref 35.0–47.0)
Hemoglobin: 9.8 g/dL — ABNORMAL LOW (ref 12.0–16.0)
LYMPHS ABS: 0.5 10*3/uL — AB (ref 1.0–3.6)
Lymphocytes Relative: 16 %
MCH: 29.7 pg (ref 26.0–34.0)
MCHC: 32.6 g/dL (ref 32.0–36.0)
MCV: 91.1 fL (ref 80.0–100.0)
Monocytes Absolute: 0.3 10*3/uL (ref 0.2–0.9)
Monocytes Relative: 10 %
NEUTROS PCT: 69 %
Neutro Abs: 2.4 10*3/uL (ref 1.4–6.5)
Platelets: 143 10*3/uL — ABNORMAL LOW (ref 150–440)
RBC: 3.31 MIL/uL — AB (ref 3.80–5.20)
RDW: 15.3 % — ABNORMAL HIGH (ref 11.5–14.5)
WBC: 3.4 10*3/uL — ABNORMAL LOW (ref 3.6–11.0)

## 2015-04-03 LAB — BASIC METABOLIC PANEL
ANION GAP: 6 (ref 5–15)
BUN: 29 mg/dL — ABNORMAL HIGH (ref 6–20)
CALCIUM: 9.2 mg/dL (ref 8.9–10.3)
CO2: 29 mmol/L (ref 22–32)
CREATININE: 1.3 mg/dL — AB (ref 0.44–1.00)
Chloride: 105 mmol/L (ref 101–111)
GFR, EST AFRICAN AMERICAN: 41 mL/min — AB (ref >60)
GFR, EST NON AFRICAN AMERICAN: 35 mL/min — AB (ref >60)
Glucose, Bld: 109 mg/dL — ABNORMAL HIGH (ref 65–99)
Potassium: 4.2 mmol/L (ref 3.5–5.1)
SODIUM: 140 mmol/L (ref 135–145)

## 2015-04-03 LAB — LACTATE DEHYDROGENASE: LDH: 153 U/L (ref 98–192)

## 2015-04-03 MED ORDER — DIPHENHYDRAMINE HCL 25 MG PO CAPS
50.0000 mg | ORAL_CAPSULE | Freq: Once | ORAL | Status: AC
  Administered 2015-04-03: 50 mg via ORAL
  Filled 2015-04-03: qty 2

## 2015-04-03 MED ORDER — ACETAMINOPHEN 325 MG PO TABS
650.0000 mg | ORAL_TABLET | Freq: Once | ORAL | Status: AC
Start: 2015-04-03 — End: 2015-04-03
  Administered 2015-04-03: 650 mg via ORAL
  Filled 2015-04-03: qty 2

## 2015-04-03 MED ORDER — SODIUM CHLORIDE 0.9 % IV SOLN
375.0000 mg/m2 | Freq: Once | INTRAVENOUS | Status: AC
  Administered 2015-04-03: 600 mg via INTRAVENOUS
  Filled 2015-04-03: qty 10

## 2015-04-03 MED ORDER — SODIUM CHLORIDE 0.9 % IV SOLN
Freq: Once | INTRAVENOUS | Status: AC
  Administered 2015-04-03: 10:00:00 via INTRAVENOUS
  Filled 2015-04-03: qty 1000

## 2015-04-03 MED ORDER — SODIUM CHLORIDE 0.9 % IJ SOLN
10.0000 mL | INTRAMUSCULAR | Status: DC | PRN
  Filled 2015-04-03: qty 10

## 2015-04-03 MED ORDER — SODIUM CHLORIDE 0.9 % IV SOLN: 375.0000 mg/m2 | Freq: Once | INTRAVENOUS | Status: DC

## 2015-04-03 NOTE — Progress Notes (Signed)
Patient does not have living will.  Never smoked. 

## 2015-04-04 ENCOUNTER — Ambulatory Visit: Payer: Medicare HMO

## 2015-04-10 ENCOUNTER — Inpatient Hospital Stay: Payer: Medicare HMO

## 2015-04-10 ENCOUNTER — Encounter: Payer: Self-pay | Admitting: Oncology

## 2015-04-10 ENCOUNTER — Inpatient Hospital Stay (HOSPITAL_BASED_OUTPATIENT_CLINIC_OR_DEPARTMENT_OTHER): Payer: Medicare HMO | Admitting: Oncology

## 2015-04-10 VITALS — BP 129/65 | HR 88 | Temp 95.9°F | Wt 109.8 lb

## 2015-04-10 DIAGNOSIS — M542 Cervicalgia: Secondary | ICD-10-CM

## 2015-04-10 DIAGNOSIS — C8330 Diffuse large B-cell lymphoma, unspecified site: Secondary | ICD-10-CM

## 2015-04-10 DIAGNOSIS — R1032 Left lower quadrant pain: Secondary | ICD-10-CM

## 2015-04-10 DIAGNOSIS — R630 Anorexia: Secondary | ICD-10-CM

## 2015-04-10 DIAGNOSIS — M8580 Other specified disorders of bone density and structure, unspecified site: Secondary | ICD-10-CM

## 2015-04-10 DIAGNOSIS — R599 Enlarged lymph nodes, unspecified: Secondary | ICD-10-CM

## 2015-04-10 DIAGNOSIS — R1900 Intra-abdominal and pelvic swelling, mass and lump, unspecified site: Secondary | ICD-10-CM

## 2015-04-10 DIAGNOSIS — C8331 Diffuse large B-cell lymphoma, lymph nodes of head, face, and neck: Secondary | ICD-10-CM

## 2015-04-10 DIAGNOSIS — Z79899 Other long term (current) drug therapy: Secondary | ICD-10-CM

## 2015-04-10 DIAGNOSIS — Z8551 Personal history of malignant neoplasm of bladder: Secondary | ICD-10-CM

## 2015-04-10 DIAGNOSIS — M5030 Other cervical disc degeneration, unspecified cervical region: Secondary | ICD-10-CM

## 2015-04-10 DIAGNOSIS — Z5111 Encounter for antineoplastic chemotherapy: Secondary | ICD-10-CM | POA: Diagnosis not present

## 2015-04-10 LAB — CBC WITH DIFFERENTIAL/PLATELET
BASOS ABS: 0 10*3/uL (ref 0–0.1)
Basophils Relative: 1 %
Eosinophils Absolute: 0.1 10*3/uL (ref 0–0.7)
Eosinophils Relative: 4 %
HCT: 30 % — ABNORMAL LOW (ref 35.0–47.0)
HEMOGLOBIN: 9.7 g/dL — AB (ref 12.0–16.0)
LYMPHS PCT: 19 %
Lymphs Abs: 0.5 10*3/uL — ABNORMAL LOW (ref 1.0–3.6)
MCH: 29.8 pg (ref 26.0–34.0)
MCHC: 32.2 g/dL (ref 32.0–36.0)
MCV: 92.5 fL (ref 80.0–100.0)
Monocytes Absolute: 0.3 10*3/uL (ref 0.2–0.9)
Monocytes Relative: 11 %
NEUTROS ABS: 1.8 10*3/uL (ref 1.4–6.5)
Neutrophils Relative %: 65 %
Platelets: 185 10*3/uL (ref 150–440)
RBC: 3.25 MIL/uL — AB (ref 3.80–5.20)
RDW: 15 % — AB (ref 11.5–14.5)
WBC: 2.8 10*3/uL — AB (ref 3.6–11.0)

## 2015-04-10 LAB — BASIC METABOLIC PANEL
Anion gap: 3 — ABNORMAL LOW (ref 5–15)
BUN: 28 mg/dL — ABNORMAL HIGH (ref 6–20)
CO2: 29 mmol/L (ref 22–32)
Calcium: 9.2 mg/dL (ref 8.9–10.3)
Chloride: 107 mmol/L (ref 101–111)
Creatinine, Ser: 1.23 mg/dL — ABNORMAL HIGH (ref 0.44–1.00)
GFR calc non Af Amer: 38 mL/min — ABNORMAL LOW (ref >60)
GFR, EST AFRICAN AMERICAN: 44 mL/min — AB (ref >60)
Glucose, Bld: 91 mg/dL (ref 65–99)
POTASSIUM: 3.7 mmol/L (ref 3.5–5.1)
SODIUM: 139 mmol/L (ref 135–145)

## 2015-04-10 LAB — MAGNESIUM: MAGNESIUM: 2 mg/dL (ref 1.7–2.4)

## 2015-04-10 LAB — LACTATE DEHYDROGENASE: LDH: 146 U/L (ref 98–192)

## 2015-04-10 MED ORDER — SODIUM CHLORIDE 0.9 % IV SOLN: 375.0000 mg/m2 | Freq: Once | INTRAVENOUS | Status: DC

## 2015-04-10 MED ORDER — SODIUM CHLORIDE 0.9 % IV SOLN
Freq: Once | INTRAVENOUS | Status: AC
  Administered 2015-04-10: 10:00:00 via INTRAVENOUS
  Filled 2015-04-10: qty 1000

## 2015-04-10 MED ORDER — DIPHENHYDRAMINE HCL 25 MG PO CAPS
50.0000 mg | ORAL_CAPSULE | Freq: Once | ORAL | Status: AC
  Administered 2015-04-10: 50 mg via ORAL
  Filled 2015-04-10: qty 2

## 2015-04-10 MED ORDER — ACETAMINOPHEN 325 MG PO TABS
650.0000 mg | ORAL_TABLET | Freq: Once | ORAL | Status: AC
Start: 2015-04-10 — End: 2015-04-10
  Administered 2015-04-10: 650 mg via ORAL
  Filled 2015-04-10: qty 2

## 2015-04-10 MED ORDER — SODIUM CHLORIDE 0.9 % IV SOLN
375.0000 mg/m2 | Freq: Once | INTRAVENOUS | Status: AC
  Administered 2015-04-10: 600 mg via INTRAVENOUS
  Filled 2015-04-10: qty 10

## 2015-04-10 NOTE — Progress Notes (Signed)
[place] @ [facility] Telephone:(336) [phone]  Fax:(336) [place] OB: [birth year]  MR#: [id number]  KCL#:[id number]  Patient Care Team: [facility] as PCP - General Seeplaputhur [name], MD as Consulting Physician (General Surgery) Provider Not In [place]:  Chief Complaint  Patient presents with  . Follow-up    VISIT DIAGNOSIS:     ICD-9-CM ICD-10-CM   1. Diffuse large B cell lymphoma 202.80 C83.30 Magnesium     Oncology History   79.  81 year old lady presented with large left neck mass and abdominal palpable mass.  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  Patient is poor historian complains of noticing left supraclavicular mass overgrowth last few weeks.  Increasing pain on the left side of the neck.  Poor appetite.  No night sweats.  Has lost significant weight.  Patient underwent CT scan which revealed abdominal mass and left neck mass.  Patient lives in [place] but now has moved to [place] to be with her daughter. March 14, 2015 Patient is here to discuss the results of the biopsy.  Continues to have severe neck pain.  Pain was radiating down to left upper extremity. Biopsies positive for diffuse large B-cell lymphoma Because of pain patient cannot sleep well.  Poor appetite. April 10, 2015 Patient is here for continuation of treatment.  Pain has improved dramatically.   Swelling has gone down.  Appetite is improving.  Here for the third cycle of chemotherapy tolerating Rituxan very well patient is off steroid at present time  REVIEW OF SYSTEMS:   Gen. status: 81 year old lady in no acute distress because of pain in the left side of the neck Pain: Complains of pain and discomfort on the left side of the neck Pain and discomfort on the left side of the neck is improved patient is not taking any pain medication patient is off steroid. Lungs: No cough no shortness of breath HEENT: Swelling on the left side of the neck Cardiac: No chest pain.  No shortness of breath. Abdomen: Soft pain in the left lower quadrant.  Lower extremities no edema. Skin: No rash Neurological system no headache no dizziness Appetite is stable   Pain Assesment PAIN  is improved Patient has modeate      pain. Narcotics has been prescribed. Instructions regarding managing constipation was given. As per HPI. Otherwise, a complete review of systems is negatve.  PAST MEDICAL HISTORY: Patient's past medical history includes his and had lumpectomy in the past.  Bladder surgery.  Hysterectomy bilateral cataract surgery.   FAMILY HISTORY NO FAMILY HISTORY OF CARCINOMA OF BREAST OVARY OR COLON CANCER  GYNECOLOGIC HISTORY:  No LMP recorded. Patient has had a hysterectomy.  And had a hysterectomy   ADVANCED DIRECTIVES:  patien s considering advanced healthcare directive  HEALTH MAINTENANCE: History  Substance Use Topics  . Smoking status: Never Smoker   . Smokeless tobacco:  Not on file  . Alcohol Use: No      Allergies  Allergen Reactions  . Iron Nausea And Vomiting  . Codeine Rash    Current Outpatient Prescriptions  Medication Sig Dispense Refill  . Calcium Carbonate-Vitamin D (CALCIUM + D PO)     . cyanocobalamin 1000 MCG tablet Take 100 mcg by mouth every 30 (thirty) days.    Marland Kitchen gabapentin (NEURONTIN) 100 MG capsule Take 1 capsule (100 mg total) by mouth 2 (two) times  daily. 60 capsule 3  . HYDROcodone-acetaminophen (NORCO/VICODIN) 5-325 MG per tablet Take 0.5 tablets by mouth every 4 (four) hours as needed for moderate pain. 30 tablet 0  . latanoprost (XALATAN) 0.005 % ophthalmic solution     . predniSONE (DELTASONE) 10 MG tablet 6 tabs po x 5 days, then 5 tabs po x 1 day, 4 tabs po x 1 day, 3 tabs po x 1 day, 2 tabs po x 1 day, 1 tab po x 1 day, then stop 45 tablet 0   No current facility-administered medications for this visit.   Facility-Administered Medications Ordered in Other Visits  Medication Dose Route Frequency Provider Last Rate Last Dose  . sodium chloride 0.9 % injection 10 mL  10 mL Intracatheter PRN Forest Gleason, MD        OBJECTIVE: PHYSICAL EXAM: Gen. status: Alert and oriented lady not in any acute distress HEENT: Hard palpable masses completely resolved.  In the neck.   Swelling in the left upper extremity is improved  Abdomen: Soft palpable mass persist might have decreased in size examined Neurologically, the patient was awake, alert, and oriented to person, place and time. There were no obvious focal neurologic abnormalities. Examination of the chest was unremarkable. There were no bony deformities, no asymmetry, and no other abnormalities. Cardiac exam revealed the PMI to be normally situated and sized. The rhythm was regular and no extrasystoles were noted during several minutes of auscultation. The first and second heart sounds were normal and physiologic splitting of the second heart sound was noted. There were no murmurs, rubs, clicks, or gallops. Lower extremity no swelling  Filed Vitals:   04/10/15 0840  BP: 129/65  Pulse: 88  Temp: 95.9 F (35.5 C)     Body mass index is 17.19 kg/(m^2).    LAB RESULTS:     STUDIES: No results found.  ASSESSMENT:  Patient had extensive lymphadenopathy with left supraclavicular area as well as abdominal mass Biopsies positive for diffuse B large cell lymphoma,  stage  III Previous history of carcinoma of bladder, .  Significant improvement with Rituxan.  Patient and family's desire not to pursue any chemotherapy at present time we will continue total 4 cycles of Rituxan and reevaluate patient for possibility of adding bendamustine PLAN:   As there is significant improvement with Rituxan and steroid in the pain and the swelling will continue 4 cycles of Rituxan and reevaluate patient in 4 weeks Total duration of visit was 45 minutes.  50% or more time was spent in counseling patient and family regarding prognosis and options of treatment and available resources  Patient expressed understanding and was in agreement with this plan. She also understands that She can call clinic at any time with any questions, concerns, or complaints.    No matching staging information was found for the patient.  Forest Gleason, MD   04/10/2015 9:19 AM

## 2015-04-10 NOTE — Progress Notes (Signed)
Patient does not have living will.  Never smoked. 

## 2015-04-10 NOTE — Progress Notes (Signed)
   04/10/15 1100  Clinical Encounter Type  Visited With Patient;Patient and family together  Visit Type Initial  Visited with patient and her daughter.  Pt said she was feeling pretty good today.  I told her it was nice to meet her and to contact pastoral care if she or her family needed anything.  Pt thanked me for visiting with her.  [place] [phone]

## 2015-04-13 ENCOUNTER — Encounter: Payer: Self-pay | Admitting: Oncology

## 2015-04-13 NOTE — Progress Notes (Signed)
[place] @ [facility] Telephone:(336) [phone]  Fax:(336) [place]: May 21, 1923  MR#: [id number]  JJH#:[id number]  Patient Care Team: [facility] as PCP - General Seeplaputhur [name], MD as Consulting Physician (General Surgery) Provider Not In [place]:  Chief Complaint  Patient presents with  . Follow-up    VISIT DIAGNOSIS:     ICD-9-CM ICD-10-CM   1. Diffuse large B cell lymphoma 202.80 C83.30 Magnesium     Oncology History   62.  81 year old lady presented with large left neck mass and abdominal palpable mass.  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  Patient is poor historian complains of noticing left supraclavicular mass overgrowth last few weeks.  Increasing pain on the left side of the neck.  Poor appetite.  No night sweats.  Has lost significant weight.  Patient underwent CT scan which revealed abdominal mass and left neck mass.  Patient lives in [place] but now has moved to [place] to be with her daughter. March 14, 2015 Patient is here to discuss the results of the biopsy.  Continues to have severe neck pain.   Pain was radiating down to left upper extremity. Biopsies positive for diffuse large B-cell lymphoma Because of pain patient cannot sleep well.  Poor appetite.  April 03, 2015 Patient is here to initiate next cycle of Rituxan chemotherapy.  Patient started treatment hospital week tolerated very well.  There is significant reduction in the size of abdominal mass.  Left cervical mass is completely resolved.  Significant improvement in pain.  Patient is not taking any pain medication appetite is improved. Family history not considering chemotherapy at present time REVIEW OF SYSTEMS:   Gen. status: Significant improvement in pain.  Patient is feeling better. Pain: It has improved. Lungs: No cough no shortness of breath HEENT: Swelling on the left side of the neck, Swelling in the mass in the left side of the neck is completely resolved Cardiac: No chest pain.  No shortness of breath. Abdomen: Soft pain in the left lower quadrant.  Lower extremities no edema. Skin: No rash Neurological system no headache no dizziness Appetite is stable  Pain Assesment Patient has modeate      pain. Narcotics has been prescribed. Instructions regarding managing constipation was given. As per HPI. Otherwise, a complete review of systems is negatve.  PAST MEDICAL HISTORY: Patient's past medical history includes his and had lumpectomy in the past.  Bladder surgery.  Hysterectomy bilateral cataract surgery.   FAMILY HISTORY NO FAMILY HISTORY OF CARCINOMA OF BREAST OVARY OR COLON CANCER  GYNECOLOGIC HISTORY:  No LMP recorded. Patient has  had a hysterectomy.  And had a hysterectomy   ADVANCED DIRECTIVES:  patien s considering advanced healthcare directive  HEALTH MAINTENANCE: History  Substance Use Topics  . Smoking status: Never Smoker   . Smokeless tobacco: Not on file  . Alcohol Use: No      Allergies  Allergen Reactions  . Iron Nausea And Vomiting  . Codeine Rash    Current Outpatient  Prescriptions  Medication Sig Dispense Refill  . Calcium Carbonate-Vitamin D (CALCIUM + D PO)     . cyanocobalamin 1000 MCG tablet Take 100 mcg by mouth every 30 (thirty) days.    Marland Kitchen HYDROcodone-acetaminophen (NORCO/VICODIN) 5-325 MG per tablet Take 0.5 tablets by mouth every 4 (four) hours as needed for moderate pain. 30 tablet 0  . latanoprost (XALATAN) 0.005 % ophthalmic solution     . gabapentin (NEURONTIN) 100 MG capsule Take 1 capsule (100 mg total) by mouth 2 (two) times daily. 60 capsule 3  . predniSONE (DELTASONE) 10 MG tablet 6 tabs po x 5 days, then 5 tabs po x 1 day, 4 tabs po x 1 day, 3 tabs po x 1 day, 2 tabs po x 1 day, 1 tab po x 1 day, then stop 45 tablet 0   No current facility-administered medications for this visit.   Facility-Administered Medications Ordered in Other Visits  Medication Dose Route Frequency Provider Last Rate Last Dose  . sodium chloride 0.9 % injection 10 mL  10 mL Intracatheter PRN Forest Gleason, MD        OBJECTIVE: PHYSICAL EXAM: Gen. status: Patient is alert and oriented feeling much better.  Because of significant relief in pain. HEENT: Left side of the neck masses resolved completely Lungs: No cough or shortness of breath Cardiac: No chest pain Abdomen: Palpable masses decreased in size no ascites.  Liver and spleen not palpable. Lower extremity no edema Upper extremity edema and left upper extremity is resolved Examination of the skin revealed no evidence of significant rashes, suspicious appearing nevi or other concerning lesions. Neurologically, the patient was awake, alert, and oriented to person, place and time. There were no obvious focal neurologic abnormalities. Psychiatric system: Patient is here for because of improvement in pain  Palpable mass in the left side of the neck is increased in size.  Abdomen palpable masses increased in size Patient has lymphedema in left upper extremity  Performance status is 01 Filed Vitals:    04/03/15 0904  BP: 120/74  Pulse: 91  Temp: 96.8 F (36 C)     Body mass index is 17.02 kg/(m^2).    LAB RESULTS:     STUDIES: No results found.  ASSESSMENT:  Patient had extensive lymphadenopathy with left supraclavicular area as well as abdominal mass Biopsies positive for diffuse B large cell lymphoma,  stage III Previous history of carcinoma of bladder,  PLAN:    All the lab data has been reviewed.  Patient has significant relief in pain and masses decreased in size.  Patient is off prednisone.  We will continue total 4 cycles of Rituxan chemotherapy Palliative nature of such treatment has been discussed.  Patient does not want to get any chemotherapy started  Patient expressed understanding and was in agreement with this plan. She also understands that She can call clinic at any time with any questions, concerns, or complaints.    No matching staging information was found for the patient.  Forest Gleason, MD   04/13/2015 8:48 AM

## 2015-04-17 ENCOUNTER — Inpatient Hospital Stay: Payer: Medicare HMO | Attending: Oncology

## 2015-04-17 VITALS — BP 109/69 | HR 73 | Temp 96.8°F

## 2015-04-17 DIAGNOSIS — R630 Anorexia: Secondary | ICD-10-CM | POA: Diagnosis not present

## 2015-04-17 DIAGNOSIS — C9110 Chronic lymphocytic leukemia of B-cell type not having achieved remission: Secondary | ICD-10-CM | POA: Diagnosis not present

## 2015-04-17 DIAGNOSIS — R634 Abnormal weight loss: Secondary | ICD-10-CM | POA: Diagnosis not present

## 2015-04-17 DIAGNOSIS — C672 Malignant neoplasm of lateral wall of bladder: Secondary | ICD-10-CM | POA: Diagnosis not present

## 2015-04-17 DIAGNOSIS — Z79899 Other long term (current) drug therapy: Secondary | ICD-10-CM | POA: Diagnosis not present

## 2015-04-17 DIAGNOSIS — Z5111 Encounter for antineoplastic chemotherapy: Secondary | ICD-10-CM | POA: Insufficient documentation

## 2015-04-17 DIAGNOSIS — M542 Cervicalgia: Secondary | ICD-10-CM | POA: Diagnosis not present

## 2015-04-17 DIAGNOSIS — C8330 Diffuse large B-cell lymphoma, unspecified site: Secondary | ICD-10-CM

## 2015-04-17 MED ORDER — SODIUM CHLORIDE 0.9 % IV SOLN
Freq: Once | INTRAVENOUS | Status: AC
  Administered 2015-04-17: 10:00:00 via INTRAVENOUS
  Filled 2015-04-17: qty 1000

## 2015-04-17 MED ORDER — ACETAMINOPHEN 325 MG PO TABS
650.0000 mg | ORAL_TABLET | Freq: Once | ORAL | Status: AC
  Administered 2015-04-17: 650 mg via ORAL
  Filled 2015-04-17: qty 2

## 2015-04-17 MED ORDER — SODIUM CHLORIDE 0.9 % IV SOLN
375.0000 mg/m2 | Freq: Once | INTRAVENOUS | Status: AC
  Administered 2015-04-17: 600 mg via INTRAVENOUS
  Filled 2015-04-17: qty 50

## 2015-04-17 MED ORDER — SODIUM CHLORIDE 0.9 % IV SOLN: 375.0000 mg/m2 | Freq: Once | INTRAVENOUS | Status: DC

## 2015-04-17 MED ORDER — DIPHENHYDRAMINE HCL 25 MG PO CAPS
50.0000 mg | ORAL_CAPSULE | Freq: Once | ORAL | Status: AC
  Administered 2015-04-17: 50 mg via ORAL
  Filled 2015-04-17: qty 2

## 2015-05-08 ENCOUNTER — Inpatient Hospital Stay (HOSPITAL_BASED_OUTPATIENT_CLINIC_OR_DEPARTMENT_OTHER): Payer: Medicare HMO | Admitting: Oncology

## 2015-05-08 ENCOUNTER — Encounter: Payer: Self-pay | Admitting: Oncology

## 2015-05-08 ENCOUNTER — Inpatient Hospital Stay: Payer: Medicare HMO

## 2015-05-08 VITALS — BP 126/69 | HR 77 | Temp 95.6°F | Wt 110.2 lb

## 2015-05-08 DIAGNOSIS — C8330 Diffuse large B-cell lymphoma, unspecified site: Secondary | ICD-10-CM

## 2015-05-08 DIAGNOSIS — Z79899 Other long term (current) drug therapy: Secondary | ICD-10-CM

## 2015-05-08 DIAGNOSIS — R630 Anorexia: Secondary | ICD-10-CM

## 2015-05-08 DIAGNOSIS — M542 Cervicalgia: Secondary | ICD-10-CM | POA: Diagnosis not present

## 2015-05-08 DIAGNOSIS — R634 Abnormal weight loss: Secondary | ICD-10-CM

## 2015-05-08 DIAGNOSIS — C9110 Chronic lymphocytic leukemia of B-cell type not having achieved remission: Secondary | ICD-10-CM | POA: Diagnosis not present

## 2015-05-08 DIAGNOSIS — C672 Malignant neoplasm of lateral wall of bladder: Secondary | ICD-10-CM | POA: Diagnosis not present

## 2015-05-08 DIAGNOSIS — Z5111 Encounter for antineoplastic chemotherapy: Secondary | ICD-10-CM | POA: Diagnosis not present

## 2015-05-08 LAB — BASIC METABOLIC PANEL
Anion gap: 4 — ABNORMAL LOW (ref 5–15)
BUN: 29 mg/dL — AB (ref 6–20)
CO2: 30 mmol/L (ref 22–32)
Calcium: 9.3 mg/dL (ref 8.9–10.3)
Chloride: 105 mmol/L (ref 101–111)
Creatinine, Ser: 1.2 mg/dL — ABNORMAL HIGH (ref 0.44–1.00)
GFR, EST AFRICAN AMERICAN: 45 mL/min — AB (ref >60)
GFR, EST NON AFRICAN AMERICAN: 39 mL/min — AB (ref >60)
GLUCOSE: 120 mg/dL — AB (ref 65–99)
Potassium: 4.3 mmol/L (ref 3.5–5.1)
SODIUM: 139 mmol/L (ref 135–145)

## 2015-05-08 LAB — CBC WITH DIFFERENTIAL/PLATELET
Basophils Absolute: 0 10*3/uL (ref 0–0.1)
Basophils Relative: 1 %
Eosinophils Absolute: 0.1 10*3/uL (ref 0–0.7)
Eosinophils Relative: 3 %
HCT: 33.4 % — ABNORMAL LOW (ref 35.0–47.0)
Hemoglobin: 10.8 g/dL — ABNORMAL LOW (ref 12.0–16.0)
Lymphocytes Relative: 21 %
Lymphs Abs: 0.7 10*3/uL — ABNORMAL LOW (ref 1.0–3.6)
MCH: 29.6 pg (ref 26.0–34.0)
MCHC: 32.3 g/dL (ref 32.0–36.0)
MCV: 91.5 fL (ref 80.0–100.0)
Monocytes Absolute: 0.3 10*3/uL (ref 0.2–0.9)
Monocytes Relative: 9 %
Neutro Abs: 2.3 10*3/uL (ref 1.4–6.5)
Neutrophils Relative %: 66 %
Platelets: 163 10*3/uL (ref 150–440)
RBC: 3.65 MIL/uL — ABNORMAL LOW (ref 3.80–5.20)
RDW: 14.1 % (ref 11.5–14.5)
WBC: 3.5 10*3/uL — ABNORMAL LOW (ref 3.6–11.0)

## 2015-05-08 LAB — MAGNESIUM: MAGNESIUM: 2.1 mg/dL (ref 1.7–2.4)

## 2015-05-08 LAB — LACTATE DEHYDROGENASE: LDH: 147 U/L (ref 98–192)

## 2015-05-08 NOTE — Progress Notes (Signed)
[place] @ [facility] Telephone:(336) [phone]  Fax:(336) [place] OB: 01/09/1923  MR#: [id number]  [id number]#:[id number]  Patient Care Team: [facility] as PCP - General Seeplaputhur [name], MD as Consulting Physician (General Surgery) Provider Not In [place]:  Chief Complaint  Patient presents with  . Follow-up    VISIT DIAGNOSIS:     ICD-9-CM ICD-10-CM   1. Diffuse large B cell lymphoma 202.80 C83.30 Comprehensive metabolic panel     Lactate dehydrogenase     Oncology History   30.  81 year old lady presented with large left neck mass and abdominal palpable mass.  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  Patient is poor historian complains of noticing left supraclavicular mass overgrowth last few weeks.  Increasing pain on the left side of the neck.  Poor appetite.  No night sweats.  Has lost significant weight.  Patient underwent CT scan which revealed abdominal mass and left neck mass.  Patient lives in [place] but now has moved to [place] to be with her daughter. May 08, 2015 Patient is here for  ongoing evaluation and treatment consideration or prednisone patient has significant response and pain relief.  Not taking any pain medication on regular basis. No nausea.  No vomiting.  No diarrhea.  REVIEW OF SYSTEMS:   Gen. status: Patient's pain has improved.  Feeling better and stronger Pain: Complains of pain and discomfort on the left side of the neck Pain and discomfort on the left side of the neck is improved patient is not taking any pain medication patient is off steroid. Lungs: No cough no shortness of breath HEENT: Swelling on the left side of the neck Cardiac: No chest pain.  No shortness of breath. Abdomen: Soft pain in the left lower quadrant.  Lower extremities no edema. Skin: No rash Neurological system no headache no dizziness Appetite is stable   Pain Assesment PAIN  is improved Patient has modeate      pain. Narcotics has been prescribed. Instructions regarding managing constipation was given. As per HPI. Otherwise, a complete review of systems is negatve.  PAST MEDICAL HISTORY: Patient's past medical history includes his and had lumpectomy in the past.  Bladder surgery.  Hysterectomy bilateral cataract surgery.   FAMILY HISTORY NO FAMILY HISTORY OF CARCINOMA OF BREAST OVARY OR COLON CANCER  GYNECOLOGIC HISTORY:  No LMP recorded. Patient has had a hysterectomy.  And had a hysterectomy   ADVANCED DIRECTIVES:  patien s considering advanced healthcare directive  HEALTH MAINTENANCE: History  Substance Use Topics  . Smoking status: Never Smoker   .  Smokeless tobacco: Not on file  . Alcohol Use: No      Allergies  Allergen Reactions  . Iron Nausea And Vomiting  . Codeine Rash    Current Outpatient Prescriptions  Medication Sig Dispense Refill  . Calcium Carbonate-Vitamin D (CALCIUM + D PO)     . cyanocobalamin 1000 MCG tablet Take 100 mcg by mouth every 30 (thirty) days.    Marland Kitchen gabapentin (NEURONTIN) 100 MG capsule Take 1 capsule (100 mg total) by  mouth 2 (two) times daily. 60 capsule 3  . HYDROcodone-acetaminophen (NORCO/VICODIN) 5-325 MG per tablet Take 0.5 tablets by mouth every 4 (four) hours as needed for moderate pain. 30 tablet 0  . latanoprost (XALATAN) 0.005 % ophthalmic solution     . predniSONE (DELTASONE) 10 MG tablet 6 tabs po x 5 days, then 5 tabs po x 1 day, 4 tabs po x 1 day, 3 tabs po x 1 day, 2 tabs po x 1 day, 1 tab po x 1 day, then stop 45 tablet 0   No current facility-administered medications for this visit.   Facility-Administered Medications Ordered in Other Visits  Medication Dose Route Frequency Provider Last Rate Last Dose  . sodium chloride 0.9 % injection 10 mL  10 mL Intracatheter PRN Forest Gleason, MD        OBJECTIVE: PHYSICAL EXAM: Gen. status: Alert and oriented lady not in any acute distress HEENT: Hard palpable masses completely resolved.  In the neck.   Swelling in the left upper extremity is improved  Abdomen: Soft palpable mass persist might have decreased in size examined Neurologically, the patient was awake, alert, and oriented to person, place and time. There were no obvious focal neurologic abnormalities. Examination of the chest was unremarkable. There were no bony deformities, no asymmetry, and no other abnormalities. Cardiac exam revealed the PMI to be normally situated and sized. The rhythm was regular and no extrasystoles were noted during several minutes of auscultation. The first and second heart sounds were normal and physiologic splitting of the second heart sound was noted. There were no murmurs, rubs, clicks, or gallops. Lower extremity no swelling  Filed Vitals:   05/08/15 1050  BP: 126/69  Pulse: 77  Temp: 95.6 F (35.3 C)     Body mass index is 17.26 kg/(m^2).    Lab data has been reviewed   10:12 AM      WBC 3.6 - 11.0 K/uL 3.5 (L)   RBC 3.80 - 5.20 MIL/uL 3.65 (L)   Hemoglobin 12.0 - 16.0 g/dL 10.8 (L)   HCT 35.0 - 47.0 % 33.4 (L)   MCV 80.0 - 100.0 fL 91.5    MCH 26.0 - 34.0 pg 29.6   MCHC 32.0 - 36.0 g/dL 32.3   RDW 11.5 - 14.5 % 14.1   Platelets 150 - 440 K/uL 163   Neutrophils Relative % % 66   Neutro Abs 1.4 - 6.5 K/uL 2.3   Lymphocytes Relative % 21   Lymphs Abs 1.0 - 3.6 K/uL 0.7 (L         STUDIES: No results found.  ASSESSMENT:  Patient had extensive lymphadenopathy with left supraclavicular area as well as abdominal mass Biopsies positive for diffuse B large cell lymphoma,  stage III Previous history of carcinoma of bladder, .  All lab data has been reviewed  Significant improvement with Rituxan.  Patient and family's desire not to pursue any chemotherapy at present time we will continue total 4 cycles of Rituxan and reevaluate  patient for possibility of adding bendamustine PLAN:   Patient is excellent response to the 4 cycles of rituximab so we will hold any further therapy reevaluate patient in 2 months from the last treatment and continue maintenance Rituxan because of bulky disease. Patient and family was instructed to call me if there is any more pain if any lymph node appears in the neck or deep there is any increased pain in abdominal area situation can be reevaluated for either adding rituximab along with chemotherapy  Patient expressed understanding and was in agreement with this plan. She also understands that She can call clinic at any time with any questions, concerns, or complaints.    No matching staging information was found for the patient.  Forest Gleason, MD   05/08/2015 11:17 AM

## 2015-05-08 NOTE — Progress Notes (Signed)
Patient has healthcare POA.  Never smoked.

## 2015-06-19 ENCOUNTER — Encounter: Payer: Self-pay | Admitting: Oncology

## 2015-06-19 ENCOUNTER — Inpatient Hospital Stay: Payer: Medicare HMO | Attending: Oncology

## 2015-06-19 ENCOUNTER — Inpatient Hospital Stay: Payer: Medicare HMO

## 2015-06-19 ENCOUNTER — Inpatient Hospital Stay (HOSPITAL_BASED_OUTPATIENT_CLINIC_OR_DEPARTMENT_OTHER): Payer: Medicare HMO | Admitting: Oncology

## 2015-06-19 VITALS — BP 116/69 | HR 84 | Temp 96.6°F | Wt 112.2 lb

## 2015-06-19 DIAGNOSIS — M542 Cervicalgia: Secondary | ICD-10-CM | POA: Insufficient documentation

## 2015-06-19 DIAGNOSIS — Z5111 Encounter for antineoplastic chemotherapy: Secondary | ICD-10-CM | POA: Diagnosis not present

## 2015-06-19 DIAGNOSIS — R634 Abnormal weight loss: Secondary | ICD-10-CM

## 2015-06-19 DIAGNOSIS — Z79899 Other long term (current) drug therapy: Secondary | ICD-10-CM

## 2015-06-19 DIAGNOSIS — C8330 Diffuse large B-cell lymphoma, unspecified site: Secondary | ICD-10-CM | POA: Diagnosis not present

## 2015-06-19 LAB — COMPREHENSIVE METABOLIC PANEL
ALK PHOS: 66 U/L (ref 38–126)
ALT: 21 U/L (ref 14–54)
ANION GAP: 4 — AB (ref 5–15)
AST: 26 U/L (ref 15–41)
Albumin: 4.3 g/dL (ref 3.5–5.0)
BUN: 34 mg/dL — ABNORMAL HIGH (ref 6–20)
CALCIUM: 9.3 mg/dL (ref 8.9–10.3)
CHLORIDE: 107 mmol/L (ref 101–111)
CO2: 30 mmol/L (ref 22–32)
Creatinine, Ser: 1.29 mg/dL — ABNORMAL HIGH (ref 0.44–1.00)
GFR calc non Af Amer: 35 mL/min — ABNORMAL LOW (ref >60)
GFR, EST AFRICAN AMERICAN: 41 mL/min — AB (ref >60)
Glucose, Bld: 76 mg/dL (ref 65–99)
Potassium: 4.6 mmol/L (ref 3.5–5.1)
SODIUM: 141 mmol/L (ref 135–145)
Total Bilirubin: 0.5 mg/dL (ref 0.3–1.2)
Total Protein: 6.8 g/dL (ref 6.5–8.1)

## 2015-06-19 LAB — CBC WITH DIFFERENTIAL/PLATELET
Basophils Absolute: 0 10*3/uL (ref 0–0.1)
Basophils Relative: 1 %
EOS ABS: 0.2 10*3/uL (ref 0–0.7)
EOS PCT: 4 %
HCT: 35 % (ref 35.0–47.0)
Hemoglobin: 11.5 g/dL — ABNORMAL LOW (ref 12.0–16.0)
LYMPHS ABS: 0.7 10*3/uL — AB (ref 1.0–3.6)
Lymphocytes Relative: 15 %
MCH: 29.3 pg (ref 26.0–34.0)
MCHC: 33 g/dL (ref 32.0–36.0)
MCV: 89 fL (ref 80.0–100.0)
MONO ABS: 0.4 10*3/uL (ref 0.2–0.9)
MONOS PCT: 9 %
Neutro Abs: 3.1 10*3/uL (ref 1.4–6.5)
Neutrophils Relative %: 71 %
PLATELETS: 159 10*3/uL (ref 150–440)
RBC: 3.94 MIL/uL (ref 3.80–5.20)
RDW: 13.7 % (ref 11.5–14.5)
WBC: 4.4 10*3/uL (ref 3.6–11.0)

## 2015-06-19 LAB — LACTATE DEHYDROGENASE: LDH: 141 U/L (ref 98–192)

## 2015-06-19 MED ORDER — SODIUM CHLORIDE 0.9 % IV SOLN
375.0000 mg/m2 | Freq: Once | INTRAVENOUS | Status: AC
  Administered 2015-06-19: 600 mg via INTRAVENOUS
  Filled 2015-06-19: qty 60

## 2015-06-19 MED ORDER — DIPHENHYDRAMINE HCL 25 MG PO CAPS
50.0000 mg | ORAL_CAPSULE | Freq: Once | ORAL | Status: AC
  Administered 2015-06-19: 50 mg via ORAL
  Filled 2015-06-19: qty 2

## 2015-06-19 MED ORDER — ACETAMINOPHEN 325 MG PO TABS
650.0000 mg | ORAL_TABLET | Freq: Once | ORAL | Status: AC
  Administered 2015-06-19: 650 mg via ORAL
  Filled 2015-06-19: qty 2

## 2015-06-19 MED ORDER — SODIUM CHLORIDE 0.9 % IV SOLN: 375.0000 mg/m2 | Freq: Once | INTRAVENOUS | Status: DC

## 2015-06-19 MED ORDER — SODIUM CHLORIDE 0.9 % IV SOLN
Freq: Once | INTRAVENOUS | Status: AC
  Administered 2015-06-19: 14:00:00 via INTRAVENOUS
  Filled 2015-06-19: qty 1000

## 2015-06-19 NOTE — Progress Notes (Signed)
Patient does have living will. Never smoked. 

## 2015-06-19 NOTE — Progress Notes (Signed)
[place] @ [facility] Telephone:(336) [phone]  Fax:(336) [place] OB: Jun 06, 1923  MR#: [id number]  QJJ#:[id number]  Patient Care Team: [facility] as PCP - General Seeplaputhur [name], MD as Consulting Physician (General Surgery) Provider Not In [place]:  Chief Complaint  Patient presents with  . Follow-up    VISIT DIAGNOSIS:   Diffuse be last cell lymphoma  Oncology History   31.  81 year old lady presented with large left neck mass and abdominal palpable mass.  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  Patient did not want any chemotherapy so was started on prednisone followed by Rituxan in June of 2016 with excellent response 3.  Patient has finished 4 weekly cycle of rituximab on April 16, 2015 with excellent response   4.  Because of bulky disease in the past patient is getting maintenance Rituxan therapy started in September of 2016   Oncology Flowsheet 03/27/2015 04/03/2015 04/10/2015 04/17/2015  diphenhydrAMINE (BENADRYL) PO 50 mg 50 mg 50 mg 50 mg  methylPREDNISolone sodium succinate 125 mg/2 mL (SOLU-MEDROL) IV 125 mg - - -  riTUXimab (RITUXAN) IV 375 mg/m2 375 mg/m2 375 mg/m2 375 mg/m2    INTERVAL HISTORY: A 81 year old right ear was referred to me for further evaluation regarding the left supraclavicular and abdominal mass.  Patient is poor historian complains of noticing left supraclavicular mass overgrowth last few weeks.  Increasing pain on the left side of the neck.  Poor appetite.  No night sweats.  Has lost significant weight.  Patient underwent CT scan which revealed abdominal mass and left neck mass.  Patient lives in [place] but now has moved to [place] to be with her daughter. May 08, 2015 Patient is here for ongoing evaluation and treatment consideration or prednisone patient has significant response and pain  relief.  Not taking any pain medication on regular basis. No nausea.  No vomiting.  No diarrhea. June 19, 2015 Patient is here for ongoing evaluation and continuation of treatment.  Overall patient continues to feel better no abdominal pain.  Mass in the neck is resolved.  Here for further follow-up.  No chills.  No fever. No chills and fever.    REVIEW OF SYSTEMS:   Gen. status: Patient's pain has improved.  Feeling better and stronger Pain: Complains of pain and discomfort on the left side of the neck Pain and discomfort on the left side of the neck is improved patient is not taking any pain medication patient is off steroid. Lungs: No cough no shortness of breath HEENT: Swelling on the left side of the neck Cardiac: No chest pain.  No shortness of breath. Abdomen: Soft pain in the left lower quadrant.  Lower extremities no edema. Skin: No rash Neurological system no headache no dizziness Appetite is stable   Pain Assesment PAIN  is improved Patient has modeate      pain. Narcotics has been prescribed. Instructions regarding managing constipation was given. As per HPI. Otherwise, a complete review of systems is negatve.  PAST MEDICAL HISTORY: Patient's past medical history includes his and had lumpectomy in the past.  Bladder surgery.  Hysterectomy bilateral cataract surgery.   FAMILY HISTORY NO FAMILY HISTORY OF CARCINOMA OF BREAST OVARY OR COLON CANCER  GYNECOLOGIC HISTORY:  No LMP recorded. Patient has had a hysterectomy.  And had a hysterectomy   ADVANCED DIRECTIVES:  patien s considering advanced healthcare directive  HEALTH MAINTENANCE: Social History  Substance Use Topics  . Smoking status: Never Smoker   . Smokeless tobacco: None  . Alcohol Use: No      Allergies  Allergen Reactions  . Iron Nausea And Vomiting  . Codeine Rash    Current Outpatient Prescriptions  Medication Sig Dispense Refill  . Calcium Carbonate-Vitamin D (CALCIUM + D PO)     .  cyanocobalamin 1000 MCG tablet Take 100 mcg by mouth every 30 (thirty) days.    Marland Kitchen HYDROcodone-acetaminophen (NORCO/VICODIN) 5-325 MG per tablet Take 0.5 tablets by mouth every 4 (four) hours as needed for moderate pain. 30 tablet 0  . latanoprost (XALATAN) 0.005 % ophthalmic solution     . predniSONE (DELTASONE) 10 MG tablet 6 tabs po x 5 days, then 5 tabs po x 1 day, 4 tabs po x 1 day, 3 tabs po x 1 day, 2 tabs po x 1 day, 1 tab po x 1 day, then stop 45 tablet 0  . gabapentin (NEURONTIN) 100 MG capsule Take 1 capsule (100 mg total) by mouth 2 (two) times daily. (Patient not taking: Reported on 06/19/2015) 60 capsule 3   No current facility-administered medications for this visit.   Facility-Administered Medications Ordered in Other Visits  Medication Dose Route Frequency Provider Last Rate Last Dose  . sodium chloride 0.9 % injection 10 mL  10 mL Intracatheter PRN Forest Gleason, MD        OBJECTIVE: PHYSICAL EXAM: Gen. status: Alert and oriented lady not in any acute distress HEENT: Hard palpable masses completely resolved.  In the neck.   Swelling in the left upper extremity is improved  Abdomen: Soft palpable mass persist might have decreased in size examined Neurologically, the patient was awake, alert, and oriented to person, place and time. There were no obvious focal neurologic abnormalities. Examination of the chest was unremarkable. There were no bony deformities, no asymmetry, and no other abnormalities. Cardiac exam revealed the PMI to be normally situated and sized. The rhythm was regular and no extrasystoles were noted during several minutes of auscultation. The first and second heart sounds were normal and physiologic splitting of the second heart sound was noted. There were no murmurs, rubs, clicks, or gallops. Lower extremity no swelling  Filed Vitals:   06/19/15 1346  BP: 116/69  Pulse: 84  Temp: 96.6 F (35.9 C)     Body mass index is 17.58 kg/(m^2).    Lab data has been  reviewed   10:12 AM      WBC 3.6 - 11.0 K/uL 3.5 (L)   RBC 3.80 - 5.20 MIL/uL 3.65 (L)   Hemoglobin 12.0 - 16.0 g/dL 10.8 (L)   HCT 35.0 - 47.0 % 33.4 (L)   MCV 80.0 - 100.0 fL 91.5   MCH 26.0 - 34.0 pg 29.6   MCHC 32.0 - 36.0 g/dL 32.3   RDW 11.5 - 14.5 % 14.1   Platelets 150 - 440 K/uL 163   Neutrophils Relative % % 66   Neutro Abs 1.4 - 6.5 K/uL 2.3   Lymphocytes Relative % 21   Lymphs Abs 1.0 - 3.6 K/uL 0.7 (L         STUDIES: No results found.  ASSESSMENT:  Patient had extensive lymphadenopathy with left supraclavicular area as well as abdominal mass Biopsies positive for diffuse B large cell lymphoma,  stage III Previous history of carcinoma of bladder, .  All lab data  has been  reviewed  Patient has significant clinical improvement with rituximab therapy Because of bulky disease would like  to go with maintenance Rituxan therapy  PLAN:   Patient is excellent response to the 4 cycles of rituximab so we will hold any further therapy reevaluate patient in 2 months from the last treatment and continue maintenance Rituxan because of bulky disease. Proceed with Rituxan maintenance therapy today Patient and family was instructed to call me if there is any more pain if any lymph node appears in the neck or deep there is any increased pain in abdominal area situation can be reevaluated for either adding rituximab along with chemotherapy  Patient expressed understanding and was in agreement with this plan. She also understands that She can call clinic at any time with any questions, concerns, or complaints.    No matching staging information was found for the patient.  Forest Gleason, MD   06/19/2015 1:53 PM

## 2015-08-15 ENCOUNTER — Other Ambulatory Visit: Payer: Self-pay | Admitting: Oncology

## 2015-08-19 ENCOUNTER — Inpatient Hospital Stay: Payer: Medicare HMO

## 2015-08-19 ENCOUNTER — Inpatient Hospital Stay: Payer: Medicare HMO | Attending: Oncology | Admitting: Oncology

## 2015-08-19 ENCOUNTER — Encounter: Payer: Self-pay | Admitting: Oncology

## 2015-08-19 VITALS — BP 135/62 | HR 74 | Temp 94.7°F | Wt 114.1 lb

## 2015-08-19 DIAGNOSIS — C8330 Diffuse large B-cell lymphoma, unspecified site: Secondary | ICD-10-CM | POA: Diagnosis not present

## 2015-08-19 DIAGNOSIS — Z23 Encounter for immunization: Secondary | ICD-10-CM | POA: Insufficient documentation

## 2015-08-19 DIAGNOSIS — Z5112 Encounter for antineoplastic immunotherapy: Secondary | ICD-10-CM | POA: Diagnosis present

## 2015-08-19 DIAGNOSIS — Z79899 Other long term (current) drug therapy: Secondary | ICD-10-CM | POA: Diagnosis not present

## 2015-08-19 DIAGNOSIS — C83398 Diffuse large b-cell lymphoma of other extranodal and solid organ sites: Secondary | ICD-10-CM

## 2015-08-19 DIAGNOSIS — C8339 Diffuse large B-cell lymphoma, extranodal and solid organ sites: Secondary | ICD-10-CM

## 2015-08-19 LAB — CBC WITH DIFFERENTIAL/PLATELET
BASOS ABS: 0 10*3/uL (ref 0–0.1)
Basophils Relative: 1 %
Eosinophils Absolute: 0.1 10*3/uL (ref 0–0.7)
Eosinophils Relative: 4 %
HEMATOCRIT: 35.7 % (ref 35.0–47.0)
Hemoglobin: 11.7 g/dL — ABNORMAL LOW (ref 12.0–16.0)
LYMPHS PCT: 16 %
Lymphs Abs: 0.6 10*3/uL — ABNORMAL LOW (ref 1.0–3.6)
MCH: 28.8 pg (ref 26.0–34.0)
MCHC: 32.8 g/dL (ref 32.0–36.0)
MCV: 87.8 fL (ref 80.0–100.0)
Monocytes Absolute: 0.4 10*3/uL (ref 0.2–0.9)
Monocytes Relative: 11 %
NEUTROS ABS: 2.6 10*3/uL (ref 1.4–6.5)
NEUTROS PCT: 68 %
PLATELETS: 139 10*3/uL — AB (ref 150–440)
RBC: 4.07 MIL/uL (ref 3.80–5.20)
RDW: 14.3 % (ref 11.5–14.5)
WBC: 3.8 10*3/uL (ref 3.6–11.0)

## 2015-08-19 LAB — COMPREHENSIVE METABOLIC PANEL
ALT: 15 U/L (ref 14–54)
ANION GAP: 4 — AB (ref 5–15)
AST: 22 U/L (ref 15–41)
Albumin: 4.1 g/dL (ref 3.5–5.0)
Alkaline Phosphatase: 75 U/L (ref 38–126)
BUN: 30 mg/dL — ABNORMAL HIGH (ref 6–20)
CHLORIDE: 106 mmol/L (ref 101–111)
CO2: 29 mmol/L (ref 22–32)
CREATININE: 1.13 mg/dL — AB (ref 0.44–1.00)
Calcium: 9.7 mg/dL (ref 8.9–10.3)
GFR calc non Af Amer: 41 mL/min — ABNORMAL LOW (ref >60)
GFR, EST AFRICAN AMERICAN: 48 mL/min — AB (ref >60)
Glucose, Bld: 84 mg/dL (ref 65–99)
POTASSIUM: 4 mmol/L (ref 3.5–5.1)
Sodium: 139 mmol/L (ref 135–145)
Total Bilirubin: 0.6 mg/dL (ref 0.3–1.2)
Total Protein: 6.9 g/dL (ref 6.5–8.1)

## 2015-08-19 LAB — LACTATE DEHYDROGENASE: LDH: 166 U/L (ref 98–192)

## 2015-08-19 MED ORDER — INFLUENZA VAC SPLIT QUAD 0.5 ML IM SUSY
0.5000 mL | PREFILLED_SYRINGE | Freq: Once | INTRAMUSCULAR | Status: AC
  Administered 2015-08-19: 0.5 mL via INTRAMUSCULAR
  Filled 2015-08-19: qty 0.5

## 2015-08-19 MED ORDER — SODIUM CHLORIDE 0.9 % IV SOLN
Freq: Once | INTRAVENOUS | Status: AC
  Administered 2015-08-19: 11:00:00 via INTRAVENOUS
  Filled 2015-08-19: qty 1000

## 2015-08-19 MED ORDER — SODIUM CHLORIDE 0.9 % IV SOLN
375.0000 mg/m2 | Freq: Once | INTRAVENOUS | Status: AC
  Administered 2015-08-19: 600 mg via INTRAVENOUS
  Filled 2015-08-19: qty 50

## 2015-08-19 MED ORDER — ACETAMINOPHEN 325 MG PO TABS
650.0000 mg | ORAL_TABLET | Freq: Once | ORAL | Status: AC
  Administered 2015-08-19: 650 mg via ORAL
  Filled 2015-08-19: qty 2

## 2015-08-19 MED ORDER — DIPHENHYDRAMINE HCL 25 MG PO CAPS
50.0000 mg | ORAL_CAPSULE | Freq: Once | ORAL | Status: AC
  Administered 2015-08-19: 50 mg via ORAL
  Filled 2015-08-19: qty 2

## 2015-08-19 MED ORDER — SODIUM CHLORIDE 0.9 % IV SOLN: 375.0000 mg/m2 | Freq: Once | INTRAVENOUS | Status: DC

## 2015-08-19 NOTE — Progress Notes (Signed)
Patient states sometimes when she eats it bothers her stomach.

## 2015-08-19 NOTE — Progress Notes (Signed)
[place] @ [facility] [phone]) [phone]  Fax:(336) [place] OB: 1923/01/31  MR#: [id number]  JYN#:[id number]  Patient Care Team: [facility] as PCP - General Seeplaputhur [name], MD as Consulting Physician (General Surgery) Provider Not In [place]:  Chief Complaint  Patient presents with  . OTHER    VISIT DIAGNOSIS:   Diffuse be last cell lymphoma  Oncology History   26.  81 year old lady presented with large left neck mass and abdominal palpable mass.  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  Patient did not want any chemotherapy so was started on prednisone followed by Rituxan in June of 2016 with excellent response 3.  Patient has finished 4 weekly cycle of rituximab on April 16, 2015 with excellent response   4.  Because of bulky disease in the past patient is getting maintenance Rituxan therapy started in September of 2016   Oncology Flowsheet 03/27/2015 04/03/2015 04/10/2015 04/17/2015 06/19/2015  diphenhydrAMINE (BENADRYL) PO 50 mg 50 mg 50 mg 50 mg 50 mg  methylPREDNISolone sodium succinate 125 mg/2 mL (SOLU-MEDROL) IV 125 mg - - - -  riTUXimab (RITUXAN) IV 375 mg/m2 375 mg/m2 375 mg/m2 375 mg/m2 375 mg/m2    INTERVAL HISTORY: A 81 year old right ear was referred to me for further evaluation regarding the left supraclavicular and abdominal mass.  Patient is poor historian complains of noticing left supraclavicular mass overgrowth last few weeks.  Increasing pain on the left side of the neck.  Poor appetite.  No night sweats.  Has lost significant weight.  Patient underwent CT scan which revealed abdominal mass and left neck mass.  Patient lives in [place] but now has moved to [place] to be with her daughter. Patient is here for ongoing evaluation and treatment consideration.  Clinically patient has been doing fairly good  without any abdominal pain or left neck pain.  Has not noticed any mass neck. Patient is also skeptical about the flu vaccine.  According to her she had a lot of pain at injection site after fluid vaccine.     REVIEW OF SYSTEMS:   Gen. status: Patient's pain has improved.  Feeling better and stronger Pain: Complains of pain and discomfort on the left side of the neck Pain and discomfort on the left side of the neck is improved patient is not taking any pain medication patient is off steroid. Lungs: No cough no shortness of breath HEENT: Swelling on the left side of the neck Cardiac: No chest pain.  No shortness of breath. Abdomen: Soft pain in the left lower quadrant.  Lower extremities no edema. Skin: No rash Neurological system no headache no dizziness Appetite is stable   Pain Assesment PAIN  is improved Patient has modeate      pain. Narcotics has been prescribed. Instructions regarding managing constipation was given. As per HPI. Otherwise, a complete review of systems is negatve.  PAST MEDICAL HISTORY: Patient's past medical history includes his and had lumpectomy in the past.  Bladder surgery.  Hysterectomy bilateral cataract surgery.   FAMILY HISTORY NO FAMILY HISTORY OF CARCINOMA OF BREAST OVARY OR COLON CANCER  GYNECOLOGIC HISTORY:  No LMP recorded. Patient has had a hysterectomy.  And had a hysterectomy   ADVANCED DIRECTIVES:  patien s considering advanced healthcare directive  HEALTH MAINTENANCE: Social History  Substance Use Topics  . Smoking status:  Never Smoker   . Smokeless tobacco: None  . Alcohol Use: No      Allergies  Allergen Reactions  . Iron Nausea And Vomiting  . Codeine Rash    Current Outpatient Prescriptions  Medication Sig Dispense Refill  . Calcium Carbonate-Vitamin D (CALCIUM + D PO)     . cyanocobalamin 1000 MCG tablet Take 100 mcg by mouth every 30 (thirty) days.    Marland Kitchen gabapentin (NEURONTIN) 100 MG capsule Take 1 capsule (100 mg  total) by mouth 2 (two) times daily. 60 capsule 3  . HYDROcodone-acetaminophen (NORCO/VICODIN) 5-325 MG per tablet Take 0.5 tablets by mouth every 4 (four) hours as needed for moderate pain. 30 tablet 0  . latanoprost (XALATAN) 0.005 % ophthalmic solution     . predniSONE (DELTASONE) 10 MG tablet 6 tabs po x 5 days, then 5 tabs po x 1 day, 4 tabs po x 1 day, 3 tabs po x 1 day, 2 tabs po x 1 day, 1 tab po x 1 day, then stop 45 tablet 0   No current facility-administered medications for this visit.   Facility-Administered Medications Ordered in Other Visits  Medication Dose Route Frequency Provider Last Rate Last Dose  . sodium chloride 0.9 % injection 10 mL  10 mL Intracatheter PRN Forest Gleason, MD        OBJECTIVE: PHYSICAL EXAM: Gen. status: Alert and oriented lady not in any acute distress HEENT: Hard palpable masses completely resolved.  In the neck.   Swelling in the left upper extremity is improved  Abdomen: Soft palpable mass persist might have decreased in size examined Mass is still palpable but not painful Neurologically, the patient was awake, alert, and oriented to person, place and time. There were no obvious focal neurologic abnormalities. Examination of the chest was unremarkable. There were no bony deformities, no asymmetry, and no other abnormalities. Cardiac exam revealed the PMI to be normally situated and sized. The rhythm was regular and no extrasystoles were noted during several minutes of auscultation. The first and second heart sounds were normal and physiologic splitting of the second heart sound was noted. There were no murmurs, rubs, clicks, or gallops. Lower extremity no swelling  Filed Vitals:   08/19/15 0953  BP: 135/2  Pulse: 74  Temp: 94.7 F (34.8 C)     Body mass index is 17.87 kg/(m^2).    Lab data has been reviewed CBC Latest Ref Rng 08/19/2015 06/19/2015 05/08/2015  WBC 3.6 - 11.0 K/uL 3.8 4.4 3.5(L)  Hemoglobin 12.0 - 16.0 g/dL 11.7(L) 11.5(L)  10.8(L)  Hematocrit 35.0 - 47.0 % 35.7 35.0 33.4(L)  Platelets 150 - 440 K/uL 139(L) 159 163                                                                                 STUDIES: No results found.  ASSESSMENT:  Patient had extensive lymphadenopathy with left supraclavicular area as well as abdominal mass Biopsies positive for diffuse B large cell lymphoma,  stage III Previous history of carcinoma of bladder, .  All lab data  has been reviewed  Patient has significant clinical improvement with rituximab therapy Because of bulky disease would like  to  go with maintenance Rituxan therapy  PLAN:   Patient is excellent response to the 4 cycles of rituximab so we will hold any further therapy reevaluate patient in 2 months from the last treatment and continue maintenance Rituxan because of bulky disease. Proceed with Rituxan maintenance therapy today General condition has been doing very well.  Abdominal pain has improved neck pain has improved Patient is off all pain medication off steroid Continue maintenance Rituxan therapy as possibility that we might be dealing with follicular lymphoma transformed to diffuse B large cell lymphoma Plan and family never wanted aggressive chemotherapy and they will opting significant response to Rituxan alone treatment Patient  finally got convinced to get flu vaccine I also address an issue of repeating PET scan for reassessment which family wants to hold off until she developes  any new symptoms  Total duration of visit was 30  minutes.  50% or more time was spent in counseling patient and family regarding prognosis and options of treatment and available resources Patient expressed understanding and was in agreement with this plan. She also understands that She can call clinic at any time with any questions, concerns, or complaints.    No matching staging information was found for the patient.  Forest Gleason, MD    08/19/2015 10:20 AM

## 2015-08-25 ENCOUNTER — Encounter: Payer: Self-pay | Admitting: Oncology

## 2015-09-03 ENCOUNTER — Telehealth: Payer: Self-pay | Admitting: *Deleted

## 2015-09-03 NOTE — Telephone Encounter (Signed)
Called to report that she is having a gnawling sensation in her stomach and she is having to take pain med to help with it. Asking what can be done for her

## 2015-09-03 NOTE — Telephone Encounter (Signed)
Per Dr Rudean Hitt, patient to try OTC protonix or something similar. If pain is worse or more severe, she is to take her to ER for evaluation. If this persists call office next week for an appt to see Dr Oliva Bustard. I spoke with Lisa Sosa who said she has Ranitadine 150 mg on hand and she will give her 1 tab twice a day

## 2015-09-09 ENCOUNTER — Other Ambulatory Visit: Payer: Self-pay | Admitting: *Deleted

## 2015-09-09 ENCOUNTER — Telehealth: Payer: Self-pay | Admitting: *Deleted

## 2015-09-09 DIAGNOSIS — C8330 Diffuse large B-cell lymphoma, unspecified site: Secondary | ICD-10-CM

## 2015-09-09 NOTE — Telephone Encounter (Signed)
Will schedule for scan and md will see pt after scan to review results. Scheduling to notify pt with appt details.

## 2015-09-09 NOTE — Telephone Encounter (Signed)
Patient called stating she is having pain in her left abdomen.

## 2015-09-12 ENCOUNTER — Ambulatory Visit
Admission: RE | Admit: 2015-09-12 | Discharge: 2015-09-12 | Disposition: A | Discharge status: Home / Self Care | Payer: Medicare HMO | Source: Ambulatory Visit | Attending: Oncology | Admitting: Oncology

## 2015-09-12 ENCOUNTER — Telehealth: Payer: Self-pay | Admitting: *Deleted

## 2015-09-12 DIAGNOSIS — Z08 Encounter for follow-up examination after completed treatment for malignant neoplasm: Secondary | ICD-10-CM | POA: Insufficient documentation

## 2015-09-12 DIAGNOSIS — Z9221 Personal history of antineoplastic chemotherapy: Secondary | ICD-10-CM | POA: Insufficient documentation

## 2015-09-12 DIAGNOSIS — R221 Localized swelling, mass and lump, neck: Secondary | ICD-10-CM

## 2015-09-12 DIAGNOSIS — C8330 Diffuse large B-cell lymphoma, unspecified site: Secondary | ICD-10-CM | POA: Diagnosis present

## 2015-09-12 DIAGNOSIS — R1032 Left lower quadrant pain: Secondary | ICD-10-CM | POA: Insufficient documentation

## 2015-09-12 MED ORDER — HYDROCODONE-ACETAMINOPHEN 5-325 MG PO TABS: ORAL_TABLET | ORAL | Status: DC

## 2015-09-12 MED ORDER — IOHEXOL 300 MG/ML  SOLN
75.0000 mL | Freq: Once | INTRAMUSCULAR | Status: AC | PRN
  Administered 2015-09-12: 75 mL via INTRAVENOUS

## 2015-09-12 NOTE — Telephone Encounter (Signed)
Informed that prescription is ready to pick up  

## 2015-09-17 ENCOUNTER — Encounter: Payer: Self-pay | Admitting: Oncology

## 2015-09-17 ENCOUNTER — Inpatient Hospital Stay: Payer: Medicare HMO

## 2015-09-17 ENCOUNTER — Inpatient Hospital Stay: Payer: Medicare HMO | Attending: Oncology | Admitting: Oncology

## 2015-09-17 VITALS — BP 140/83 | HR 85 | Temp 95.8°F | Resp 18 | Wt 112.4 lb

## 2015-09-17 DIAGNOSIS — C8330 Diffuse large B-cell lymphoma, unspecified site: Secondary | ICD-10-CM | POA: Insufficient documentation

## 2015-09-17 DIAGNOSIS — R634 Abnormal weight loss: Secondary | ICD-10-CM | POA: Diagnosis not present

## 2015-09-17 DIAGNOSIS — R109 Unspecified abdominal pain: Secondary | ICD-10-CM | POA: Diagnosis not present

## 2015-09-17 DIAGNOSIS — Z79899 Other long term (current) drug therapy: Secondary | ICD-10-CM | POA: Diagnosis not present

## 2015-09-17 DIAGNOSIS — R110 Nausea: Secondary | ICD-10-CM | POA: Diagnosis not present

## 2015-09-17 DIAGNOSIS — R5383 Other fatigue: Secondary | ICD-10-CM

## 2015-09-17 LAB — CBC WITH DIFFERENTIAL/PLATELET
Basophils Absolute: 0 10*3/uL (ref 0–0.1)
Basophils Relative: 1 %
EOS ABS: 0 10*3/uL (ref 0–0.7)
Eosinophils Relative: 1 %
HEMATOCRIT: 36.9 % (ref 35.0–47.0)
HEMOGLOBIN: 12.2 g/dL (ref 12.0–16.0)
LYMPHS ABS: 0.4 10*3/uL — AB (ref 1.0–3.6)
Lymphocytes Relative: 10 %
MCH: 28.8 pg (ref 26.0–34.0)
MCHC: 33 g/dL (ref 32.0–36.0)
MCV: 87.1 fL (ref 80.0–100.0)
MONOS PCT: 6 %
Monocytes Absolute: 0.3 10*3/uL (ref 0.2–0.9)
NEUTROS ABS: 3.8 10*3/uL (ref 1.4–6.5)
NEUTROS PCT: 82 %
Platelets: 139 10*3/uL — ABNORMAL LOW (ref 150–440)
RBC: 4.23 MIL/uL (ref 3.80–5.20)
RDW: 13.9 % (ref 11.5–14.5)
WBC: 4.6 10*3/uL (ref 3.6–11.0)

## 2015-09-17 LAB — COMPREHENSIVE METABOLIC PANEL
ALT: 15 U/L (ref 14–54)
ANION GAP: 9 (ref 5–15)
AST: 24 U/L (ref 15–41)
Albumin: 4.6 g/dL (ref 3.5–5.0)
Alkaline Phosphatase: 73 U/L (ref 38–126)
BILIRUBIN TOTAL: 0.7 mg/dL (ref 0.3–1.2)
BUN: 21 mg/dL — ABNORMAL HIGH (ref 6–20)
CALCIUM: 9.9 mg/dL (ref 8.9–10.3)
CO2: 27 mmol/L (ref 22–32)
Chloride: 96 mmol/L — ABNORMAL LOW (ref 101–111)
Creatinine, Ser: 1.15 mg/dL — ABNORMAL HIGH (ref 0.44–1.00)
GFR calc non Af Amer: 41 mL/min — ABNORMAL LOW (ref >60)
GFR, EST AFRICAN AMERICAN: 47 mL/min — AB (ref >60)
Glucose, Bld: 121 mg/dL — ABNORMAL HIGH (ref 65–99)
Potassium: 3.9 mmol/L (ref 3.5–5.1)
Sodium: 132 mmol/L — ABNORMAL LOW (ref 135–145)
TOTAL PROTEIN: 7.4 g/dL (ref 6.5–8.1)

## 2015-09-17 MED ORDER — OMEPRAZOLE 20 MG PO CPDR: 20.0000 mg | DELAYED_RELEASE_CAPSULE | Freq: Two times a day (BID) | ORAL | Status: DC

## 2015-09-17 MED ORDER — PREDNISONE 5 MG PO TABS: ORAL_TABLET | ORAL | Status: DC

## 2015-09-17 MED ORDER — DEXAMETHASONE SODIUM PHOSPHATE 100 MG/10ML IJ SOLN
20.0000 mg | Freq: Once | INTRAMUSCULAR | Status: AC
  Administered 2015-09-17: 20 mg via INTRAVENOUS
  Filled 2015-09-17: qty 2

## 2015-09-17 MED ORDER — KETOROLAC TROMETHAMINE 15 MG/ML IJ SOLN
15.0000 mg | Freq: Once | INTRAMUSCULAR | Status: AC
  Administered 2015-09-17: 15 mg via INTRAVENOUS
  Filled 2015-09-17: qty 1

## 2015-09-17 NOTE — Progress Notes (Signed)
Patient here today for CT results.  Patient having a lot of pain in abdomen.

## 2015-09-17 NOTE — Progress Notes (Signed)
[place] @ [facility] [phone]  Fax:(336) [place] OB: 1923-02-12  MR#: [id number]  [id number]  Patient Care Team: [facility] as PCP - General Seeplaputhur [name], MD as Consulting Physician (General Surgery) Provider Not In [place]:  Chief Complaint  Patient presents with  . Lymphoma    VISIT DIAGNOSIS:   Diffuse be last cell lymphoma  Oncology History   59.  81 year old lady presented with large left neck mass and abdominal palpable mass.  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  Patient did not want any chemotherapy so was started on prednisone followed by Rituxan in June of 2016 with excellent response 3.  Patient has finished 4 weekly cycle of rituximab on April 16, 2015 with excellent response   4.  Because of bulky disease in the past patient is getting maintenance Rituxan therapy started in September of 2016   Oncology Flowsheet 03/27/2015 04/03/2015 04/10/2015 04/17/2015 06/19/2015 08/19/2015 09/17/2015  dexamethasone (DECADRON) IV - - - - - - 20 mg  diphenhydrAMINE (BENADRYL) PO 50 mg 50 mg 50 mg 50 mg 50 mg 50 mg -  methylPREDNISolone sodium succinate 125 mg/2 mL (SOLU-MEDROL) IV 125 mg - - - - - -  riTUXimab (RITUXAN) IV 375 mg/m2 375 mg/m2 375 mg/m2 375 mg/m2 375 mg/m2 375 mg/m2 -    INTERVAL HISTORY: A 81 year old right ear was referred to me for further evaluation regarding the left supraclavicular and abdominal mass.  Patient is poor historian complains of noticing left supraclavicular mass overgrowth last few weeks.  I Patient presents with increasing abdominal pain over  Last 2 weeks.  Pain is in epigastric area and lower abdominal area constant dull aching.  Patient has a previous history of lymphoma.  Here for further follow-up and treatment consideration  .  Patient is feeling week tired.  No  fever.  Had a repeat CT scan.  Persistent nausea poor appetite losing weight     REVIEW OF SYSTEMS:   Gen. status: Patient's pain has improved.  Feeling better and stronger Pain: Complains of pain and discomfort on the left side of the neck Pain and discomfort on the left side of the neck is improved patient is not taking any pain medication patient is off steroid. Lungs: No cough no shortness of breath HEENT: Swelling on the left side of the neck Cardiac: No chest pain.  No shortness of breath. Abdomen: Increasing abdominal pain.  Poor appetite. Skin: No rash Neurological system no headache no dizziness Pain is described about patient is reluctant to pain take pain medication    Narcotics has been prescribed. Instructions regarding managing constipation was given. As per HPI. Otherwise, a complete review of systems is negatve.  PAST MEDICAL HISTORY: Patient's past medical history includes his and had lumpectomy in the past.  Bladder surgery.  Hysterectomy bilateral cataract surgery.   FAMILY HISTORY NO FAMILY HISTORY OF CARCINOMA OF BREAST OVARY OR COLON CANCER  GYNECOLOGIC HISTORY:  No LMP recorded. Patient has had a hysterectomy.  And had a hysterectomy   ADVANCED DIRECTIVES:  patien s considering advanced healthcare directive  HEALTH MAINTENANCE: Social History  Substance Use Topics  . Smoking status: Never Smoker   . Smokeless tobacco: None  . Alcohol Use: No      Allergies  Allergen Reactions  . Iron Nausea And Vomiting  .  Codeine Rash    Current Outpatient Prescriptions  Medication Sig Dispense Refill  . Calcium Carbonate-Vitamin D (CALCIUM + D PO)     . cyanocobalamin 1000 MCG tablet Take 100 mcg by mouth every 30 (thirty) days.    Marland Kitchen gabapentin (NEURONTIN) 100 MG capsule Take 1 capsule (100 mg total) by mouth 2 (two) times daily. 60 capsule 3  . HYDROcodone-acetaminophen (NORCO/VICODIN) 5-325 MG tablet 0.5 - 1 tab 4 times a day as needed for pain 60  tablet 0  . latanoprost (XALATAN) 0.005 % ophthalmic solution     . polyethylene glycol powder (GLYCOLAX/MIRALAX) powder Take 1 Container by mouth once.    Marland Kitchen omeprazole (PRILOSEC) 20 MG capsule Take 1 capsule (20 mg total) by mouth 2 (two) times daily before a meal. 60 capsule 3  . predniSONE (DELTASONE) 5 MG tablet Start 50mg  taper by 2/5mg  daily to 0 105 tablet 0   No current facility-administered medications for this visit.   Facility-Administered Medications Ordered in Other Visits  Medication Dose Route Frequency Provider Last Rate Last Dose  . sodium chloride 0.9 % injection 10 mL  10 mL Intracatheter PRN Forest Gleason, MD        OBJECTIVE: PHYSICAL EXAM: Gen. status: Alert and oriented lady not in any acute distress HEENT: Hard palpable masses completely resolved.  In the neck.   Swelling in the left upper extremity is improved  Abdomen: Soft palpable mass persist might have decreased in size examined Mass is still palpable but not painful Neurologically, the patient was awake, alert, and oriented to person, place and time. There were no obvious focal neurologic abnormalities. Examination of the chest was unremarkable. There were no bony deformities, no asymmetry, and no other abnormalities. Cardiac exam revealed the PMI to be normally situated and sized. The rhythm was regular and no extrasystoles were noted during several minutes of auscultation. The first and second heart sounds were normal and physiologic splitting of the second heart sound was noted. There were no murmurs, rubs, clicks, or gallops. Lower extremity no swelling  Filed Vitals:   09/17/15 1037  BP: 140/83  Pulse: 85  Temp: 95.8 F (35.4 C)  Resp: 18     Body mass index is 17.61 kg/(m^2).    Lab data has been reviewed CBC Latest Ref Rng 09/17/2015 08/19/2015 06/19/2015  WBC 3.6 - 11.0 K/uL 4.6 3.8 4.4  Hemoglobin 12.0 - 16.0 g/dL 12.2 11.7(L) 11.5(L)  Hematocrit 35.0 - 47.0 % 36.9 35.7 35.0  Platelets 150 -  440 K/uL 139(L) 139(L) 159                                                                                 STUDIES: Ct Abdomen Pelvis W Contrast  09/12/2015  CLINICAL DATA:  Diffuse large B-cell lymphoma. Undergoing chemotherapy. Worsening left lower quadrant pain. EXAM: CT ABDOMEN AND PELVIS WITH CONTRAST TECHNIQUE: Multidetector CT imaging of the abdomen and pelvis was performed using the standard protocol following bolus administration of intravenous contrast. CONTRAST:  19mL OMNIPAQUE IOHEXOL 300 MG/ML  SOLN COMPARISON:  02/28/2015 FINDINGS: Lower chest:  No acute findings. Hepatobiliary: No masses or other significant abnormality. Pancreas: No mass, inflammatory changes, or other  significant abnormality. Spleen: Within normal limits in size and appearance. Adrenals/Urinary Tract: No masses identified. No evidence of hydronephrosis. Stomach/Bowel: No evidence of obstruction, inflammatory process, or abnormal fluid collections. Vascular/Lymphatic: Lymphadenopathy involving the left upper abdominal retroperitoneum and left abdominal mesentery has decreased since previous study. Largest area of lymphadenopathy currently measures 5.0 x 5.7 cm on image 22/series 2 compared to 6.9 x 7.1 cm previously. No other sites of lymphadenopathy identified within the abdomen or pelvis. No evidence of abdominal aortic aneurysm. Reproductive: Prior hysterectomy noted. Adnexal regions are unremarkable in appearance. Other: None. Musculoskeletal:  No suspicious bone lesions identified. IMPRESSION: Decreased left abdominal retroperitoneal and mesenteric lymphadenopathy since prior exam. No acute findings or progressive disease identified within the abdomen or pelvis. Electronically Signed   By: Earle Gell M.D.   On: 09/12/2015 15:45    ASSESSMENT:  Patient had extensive lymphadenopathy with left supraclavicular area as well as abdominal mass Biopsies positive for diffuse B large cell  lymphoma,  stage III Previous history of carcinoma of bladder, .  All lab data  has been reviewed  Patient has significant clinical improvement with rituximab therapy Because of bulky disease would like  to go with maintenance Rituxan therapy  PLAN:   CT scan of abdomen has been reviewed independently and reviewed with the patient.  There is progressive increase in the mass.  Patient appears to be uncomfortable. In the past patient is reluctant to get any chemotherapy Will start patient on IV Toradol and IV steroid with Decadron.  Prednisone would be given with tapering dose The pain does not improve possibility of chemotherapy would be discussed the patient is willing to consider that. .Duration of visit is 25 minutes and 50% of time was spent discussing VARIOUS  options o has been discussed.   IV Toradol 15 mg IV Decadron.   No matching staging information was found for the patient.  Forest Gleason, MD   09/17/2015 1:41 PM

## 2015-09-30 ENCOUNTER — Encounter: Payer: Self-pay | Admitting: Oncology

## 2015-09-30 ENCOUNTER — Inpatient Hospital Stay (HOSPITAL_BASED_OUTPATIENT_CLINIC_OR_DEPARTMENT_OTHER): Payer: Medicare HMO | Admitting: Oncology

## 2015-09-30 VITALS — BP 144/75 | HR 74 | Temp 96.5°F | Resp 18 | Wt 112.7 lb

## 2015-09-30 DIAGNOSIS — C8330 Diffuse large B-cell lymphoma, unspecified site: Secondary | ICD-10-CM | POA: Diagnosis not present

## 2015-09-30 DIAGNOSIS — Z79899 Other long term (current) drug therapy: Secondary | ICD-10-CM

## 2015-09-30 DIAGNOSIS — R5383 Other fatigue: Secondary | ICD-10-CM | POA: Diagnosis not present

## 2015-09-30 DIAGNOSIS — R221 Localized swelling, mass and lump, neck: Secondary | ICD-10-CM

## 2015-09-30 DIAGNOSIS — R109 Unspecified abdominal pain: Secondary | ICD-10-CM

## 2015-09-30 DIAGNOSIS — R634 Abnormal weight loss: Secondary | ICD-10-CM

## 2015-09-30 DIAGNOSIS — R110 Nausea: Secondary | ICD-10-CM

## 2015-09-30 MED ORDER — PREDNISONE 10 MG PO TABS: ORAL_TABLET | ORAL | Status: DC

## 2015-09-30 MED ORDER — HYDROCODONE-ACETAMINOPHEN 5-325 MG PO TABS: ORAL_TABLET | ORAL | Status: DC

## 2015-09-30 NOTE — Progress Notes (Signed)
Patient states she is feeling much better.  Patient accompanied by daughter who states pt had an episode of tacycardia one evening last week.

## 2015-09-30 NOTE — Progress Notes (Signed)
[place] @ [facility] [phone]) [phone]  Fax:(336) [place] OB: 05/03/1923  MR#: [id number]  [id number]  Patient Care Team: [facility] as PCP - General Seeplaputhur [name], MD as Consulting Physician (General Surgery) Provider Not In [place]:  Chief Complaint  Patient presents with  . Large B-Cell Lymphoma    VISIT DIAGNOSIS:   Diffuse be last cell lymphoma  Oncology History   92.  81 year old lady presented with large left neck mass and abdominal palpable mass.  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  Patient did not want any chemotherapy so was started on prednisone followed by Rituxan in June of 2016 with excellent response 3.  Patient has finished 4 weekly cycle of rituximab on April 16, 2015 with excellent response   4.  Because of bulky disease in the past patient is getting maintenance Rituxan therapy started in September of 2016   Oncology Flowsheet 03/27/2015 04/03/2015 04/10/2015 04/17/2015 06/19/2015 08/19/2015 09/17/2015  dexamethasone (DECADRON) IV - - - - - - 20 mg  diphenhydrAMINE (BENADRYL) PO 50 mg 50 mg 50 mg 50 mg 50 mg 50 mg -  methylPREDNISolone sodium succinate 125 mg/2 mL (SOLU-MEDROL) IV 125 mg - - - - - -  riTUXimab (RITUXAN) IV 375 mg/m2 375 mg/m2 375 mg/m2 375 mg/m2 375 mg/m2 375 mg/m2 -    INTERVAL HISTORY: A 81 year old right ear was referred to me for further evaluation regarding the left supraclavicular and abdominal mass.   2 weeks ago patient was evaluated with severe abdominal pain.  After starting patient on prednisone today patient came for follow-up is significant relief in the pain Has not taken any pain medication for last 2 weeks. Patient is on tapering dose of prednisone.  Due for rituximab in January. She does not want any aggressive chemotherapy   REVIEW OF SYSTEMS:   Gen. status:  Patient's pain is improved.   HEENT: Denies any headache or dizziness soreness in the mouth GI: No abdominal pain is improved patient is not taking any pain medication did no nausea.  No vomiting.  No diarrhea. GU: No dysuria or hematuria Skin: No rash Neurological system no headache.no dizziness. no other focal symptoms Lower extremity no edema All other systems have been reviewed    As per HPI. Otherwise, a complete review of systems is negatve.  PAST MEDICAL HISTORY: Patient's past medical history includes his and had lumpectomy in the past.  Bladder surgery.  Hysterectomy bilateral cataract surgery.   FAMILY HISTORY NO FAMILY HISTORY OF CARCINOMA OF BREAST OVARY OR COLON CANCER  GYNECOLOGIC HISTORY:  No LMP recorded.  And had a hysterectomy   ADVANCED DIRECTIVES:  patien s considering advanced healthcare directive  HEALTH MAINTENANCE: Social History  Substance Use Topics  . Smoking status: Never Smoker   . Smokeless tobacco: None  . Alcohol Use: No      Allergies  Allergen Reactions  . Iron Nausea And Vomiting  . Codeine Rash    Current Outpatient Prescriptions  Medication Sig Dispense Refill  . Calcium Carbonate-Vitamin D (CALCIUM + D PO)     . cyanocobalamin 1000 MCG tablet Take 100 mcg by mouth every 30 (thirty) days.    Marland Kitchen gabapentin (NEURONTIN) 100 MG capsule Take 1 capsule (100 mg total) by mouth 2 (two) times daily. 60 capsule 3  . HYDROcodone-acetaminophen (NORCO/VICODIN) 5-325 MG  tablet 0.5 - 1 tab 4 times a day as needed for pain 60 tablet 0  . latanoprost (XALATAN) 0.005 % ophthalmic solution     . omeprazole (PRILOSEC) 20 MG capsule Take 1 capsule (20 mg total) by mouth 2 (two) times daily before a meal. 60 capsule 3  . polyethylene glycol powder (GLYCOLAX/MIRALAX) powder Take 1 Container by mouth once.    . predniSONE (DELTASONE) 5 MG tablet Start 50mg  taper by 2/5mg  daily to 0 105 tablet 0   No current facility-administered medications for this  visit.   Facility-Administered Medications Ordered in Other Visits  Medication Dose Route Frequency Provider Last Rate Last Dose  . sodium chloride 0.9 % injection 10 mL  10 mL Intracatheter PRN Forest Gleason, MD        OBJECTIVE: PHYSICAL EXAM: Gen. status: Alert and oriented lady not in any acute distress.  Pain is improved. HEENT: Hard palpable masses completely resolved.  In the neck.   Swelling in the left upper extremity is improved  Abdomen: Soft palpable mass persist might have decreased in size examined Mass is still palpable but not painful Neurologically, the patient was awake, alert, and oriented to person, place and time. There were no obvious focal neurologic abnormalities. Examination of the chest was unremarkable. There were no bony deformities, no asymmetry, and no other abnormalities. Cardiac exam revealed the PMI to be normally situated and sized. The rhythm was regular and no extrasystoles were noted during several minutes of auscultation. The first and second heart sounds were normal and physiologic splitting of the second heart sound was noted. There were no murmurs, rubs, clicks, or gallops. Lower extremity no swelling  Filed Vitals:   09/30/15 1608  BP: 144/75  Pulse: 74  Temp: 96.5 F (35.8 C)  Resp: 18     Body mass index is 17.64 kg/(m^2).    Lab data has been reviewed CBC Latest Ref Rng 09/17/2015 08/19/2015 06/19/2015  WBC 3.6 - 11.0 K/uL 4.6 3.8 4.4  Hemoglobin 12.0 - 16.0 g/dL 12.2 11.7(L) 11.5(L)  Hematocrit 35.0 - 47.0 % 36.9 35.7 35.0  Platelets 150 - 440 K/uL 139(L) 139(L) 159                                                                                 STUDIES: Ct Abdomen Pelvis W Contrast  09/12/2015  CLINICAL DATA:  Diffuse large B-cell lymphoma. Undergoing chemotherapy. Worsening left lower quadrant pain. EXAM: CT ABDOMEN AND PELVIS WITH CONTRAST TECHNIQUE: Multidetector CT imaging of the abdomen and pelvis was  performed using the standard protocol following bolus administration of intravenous contrast. CONTRAST:  27mL OMNIPAQUE IOHEXOL 300 MG/ML  SOLN COMPARISON:  02/28/2015 FINDINGS: Lower chest:  No acute findings. Hepatobiliary: No masses or other significant abnormality. Pancreas: No mass, inflammatory changes, or other significant abnormality. Spleen: Within normal limits in size and appearance. Adrenals/Urinary Tract: No masses identified. No evidence of hydronephrosis. Stomach/Bowel: No evidence of obstruction, inflammatory process, or abnormal fluid collections. Vascular/Lymphatic: Lymphadenopathy involving the left upper abdominal retroperitoneum and left abdominal mesentery has decreased since previous study. Largest area of lymphadenopathy currently measures 5.0 x 5.7 cm on image 22/series 2 compared to 6.9 x 7.1  cm previously. No other sites of lymphadenopathy identified within the abdomen or pelvis. No evidence of abdominal aortic aneurysm. Reproductive: Prior hysterectomy noted. Adnexal regions are unremarkable in appearance. Other: None. Musculoskeletal:  No suspicious bone lesions identified. IMPRESSION: Decreased left abdominal retroperitoneal and mesenteric lymphadenopathy since prior exam. No acute findings or progressive disease identified within the abdomen or pelvis. Electronically Signed   By: Earle Gell M.D.   On: 09/12/2015 15:45    ASSESSMENT:  Patient had extensive lymphadenopathy with left supraclavicular area as well as abdominal mass Biopsies positive for diffuse B large cell lymphoma,  stage III Previous history of carcinoma of bladder, .  All lab data  has been reviewed  Patient has significant clinical improvement with rituximab therapy Because of bulky disease would like  to go with maintenance Rituxan therapy  PLAN:   Abdominal pain is improved.  We will continue prednisone 20 mg daily after patient finished his tapering dose We also discussed possibility of chemotherapy  and patient does not want that treatment at present time Consider continuing rituximab Prednisone 20 mg daily Reevaluation and consider possibility of radiation therapy to the abdominal mass  Other lab data has been reviewed  Discussed situation with patient and family  Duration of visit is 20 minutes and 50% of time was spent discussing VARIOUS  options including chemotherapy and possibility of radiation therapy  Particularly pain is not controlled with prednisone therapy Forest Gleason, MD   09/30/2015 4:22 PM

## 2015-10-01 ENCOUNTER — Encounter: Payer: Self-pay | Admitting: Oncology

## 2015-10-21 ENCOUNTER — Inpatient Hospital Stay: Payer: PPO | Admitting: Oncology

## 2015-10-21 ENCOUNTER — Inpatient Hospital Stay: Payer: PPO

## 2015-11-07 ENCOUNTER — Inpatient Hospital Stay: Payer: PPO

## 2015-11-07 ENCOUNTER — Inpatient Hospital Stay: Payer: PPO | Attending: Oncology

## 2015-11-07 ENCOUNTER — Inpatient Hospital Stay (HOSPITAL_BASED_OUTPATIENT_CLINIC_OR_DEPARTMENT_OTHER): Payer: PPO | Admitting: Oncology

## 2015-11-07 ENCOUNTER — Encounter: Payer: Self-pay | Admitting: Oncology

## 2015-11-07 VITALS — BP 181/71 | HR 85 | Temp 96.8°F | Resp 18 | Wt 113.3 lb

## 2015-11-07 DIAGNOSIS — Z8551 Personal history of malignant neoplasm of bladder: Secondary | ICD-10-CM | POA: Diagnosis not present

## 2015-11-07 DIAGNOSIS — C8339 Diffuse large B-cell lymphoma, extranodal and solid organ sites: Secondary | ICD-10-CM

## 2015-11-07 DIAGNOSIS — Z7952 Long term (current) use of systemic steroids: Secondary | ICD-10-CM

## 2015-11-07 DIAGNOSIS — C8330 Diffuse large B-cell lymphoma, unspecified site: Secondary | ICD-10-CM

## 2015-11-07 DIAGNOSIS — Z5112 Encounter for antineoplastic immunotherapy: Secondary | ICD-10-CM | POA: Insufficient documentation

## 2015-11-07 DIAGNOSIS — Z79899 Other long term (current) drug therapy: Secondary | ICD-10-CM

## 2015-11-07 LAB — CBC WITH DIFFERENTIAL/PLATELET
BASOS ABS: 0 10*3/uL (ref 0–0.1)
Basophils Relative: 0 %
Eosinophils Absolute: 0 10*3/uL (ref 0–0.7)
Eosinophils Relative: 1 %
HEMATOCRIT: 39.9 % (ref 35.0–47.0)
Hemoglobin: 13.2 g/dL (ref 12.0–16.0)
LYMPHS PCT: 11 %
Lymphs Abs: 0.9 10*3/uL — ABNORMAL LOW (ref 1.0–3.6)
MCH: 29.9 pg (ref 26.0–34.0)
MCHC: 33.1 g/dL (ref 32.0–36.0)
MCV: 90.3 fL (ref 80.0–100.0)
MONO ABS: 0.3 10*3/uL (ref 0.2–0.9)
MONOS PCT: 4 %
NEUTROS ABS: 7.2 10*3/uL — AB (ref 1.4–6.5)
Neutrophils Relative %: 84 %
Platelets: 127 10*3/uL — ABNORMAL LOW (ref 150–440)
RBC: 4.42 MIL/uL (ref 3.80–5.20)
RDW: 16.6 % — AB (ref 11.5–14.5)
WBC: 8.4 10*3/uL (ref 3.6–11.0)

## 2015-11-07 LAB — COMPREHENSIVE METABOLIC PANEL
ALT: 94 U/L — ABNORMAL HIGH (ref 14–54)
AST: 36 U/L (ref 15–41)
Albumin: 3.8 g/dL (ref 3.5–5.0)
Alkaline Phosphatase: 48 U/L (ref 38–126)
Anion gap: 4 — ABNORMAL LOW (ref 5–15)
BILIRUBIN TOTAL: 0.8 mg/dL (ref 0.3–1.2)
BUN: 39 mg/dL — AB (ref 6–20)
CALCIUM: 9.4 mg/dL (ref 8.9–10.3)
CO2: 30 mmol/L (ref 22–32)
Chloride: 103 mmol/L (ref 101–111)
Creatinine, Ser: 0.98 mg/dL (ref 0.44–1.00)
GFR calc Af Amer: 57 mL/min — ABNORMAL LOW (ref >60)
GFR, EST NON AFRICAN AMERICAN: 49 mL/min — AB (ref >60)
Glucose, Bld: 178 mg/dL — ABNORMAL HIGH (ref 65–99)
POTASSIUM: 4.3 mmol/L (ref 3.5–5.1)
Sodium: 137 mmol/L (ref 135–145)
TOTAL PROTEIN: 6.5 g/dL (ref 6.5–8.1)

## 2015-11-07 LAB — LACTATE DEHYDROGENASE: LDH: 200 U/L — AB (ref 98–192)

## 2015-11-07 MED ORDER — SODIUM CHLORIDE 0.9 % IV SOLN: 375.0000 mg/m2 | Freq: Once | INTRAVENOUS | Status: DC

## 2015-11-07 MED ORDER — DIPHENHYDRAMINE HCL 25 MG PO CAPS
50.0000 mg | ORAL_CAPSULE | Freq: Once | ORAL | Status: AC
  Administered 2015-11-07: 25 mg via ORAL
  Filled 2015-11-07: qty 2

## 2015-11-07 MED ORDER — PREDNISONE 20 MG PO TABS: 20.0000 mg | ORAL_TABLET | Freq: Every day | ORAL | Status: DC

## 2015-11-07 MED ORDER — SODIUM CHLORIDE 0.9 % IV SOLN
Freq: Once | INTRAVENOUS | Status: AC
  Administered 2015-11-07: 10:00:00 via INTRAVENOUS
  Filled 2015-11-07: qty 1000

## 2015-11-07 MED ORDER — SODIUM CHLORIDE 0.9 % IV SOLN
600.0000 mg | Freq: Once | INTRAVENOUS | Status: AC
  Administered 2015-11-07: 600 mg via INTRAVENOUS
  Filled 2015-11-07: qty 50

## 2015-11-07 MED ORDER — ACETAMINOPHEN 325 MG PO TABS
650.0000 mg | ORAL_TABLET | Freq: Once | ORAL | Status: AC
  Administered 2015-11-07: 650 mg via ORAL
  Filled 2015-11-07: qty 2

## 2015-11-07 NOTE — Progress Notes (Signed)
Patient has had some cough and congestion.

## 2015-11-07 NOTE — Progress Notes (Signed)
[place] @ [facility] Telephone:(336) [phone]  Fax:(336) [place] OB: [birth year]  MR#: [id number]  [id number]  Patient Care Team: [facility] as PCP - General Seeplaputhur [name], MD as Consulting Physician (General Surgery) Provider Not In [place]:  Chief Complaint  Patient presents with  . Large B-Cell Lymphoma    VISIT DIAGNOSIS:   Diffuse be last cell lymphoma  Oncology History   46.  82 year old lady presented with large left neck mass and abdominal palpable mass.  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  Patient did not want any chemotherapy so was started on prednisone followed by Rituxan in June of 2016 with excellent response 3.  Patient has finished 4 weekly cycle of rituximab on April 16, 2015 with excellent response   4.  Because of bulky disease in the past patient is getting maintenance Rituxan therapy started in September of 2016   Oncology Flowsheet 03/27/2015 04/03/2015 04/10/2015 04/17/2015 06/19/2015 08/19/2015 09/17/2015  dexamethasone (DECADRON) IV - - - - - - 20 mg  diphenhydrAMINE (BENADRYL) PO 50 mg 50 mg 50 mg 50 mg 50 mg 50 mg -  methylPREDNISolone sodium succinate 125 mg/2 mL (SOLU-MEDROL) IV 125 mg - - - - - -  riTUXimab (RITUXAN) IV 375 mg/m2 375 mg/m2 375 mg/m2 375 mg/m2 375 mg/m2 375 mg/m2 -    INTERVAL HISTORY: A 82 year old right ear was referred to me for further evaluation regarding the left supraclavicular and abdominal mass.   2 weeks ago patient was evaluated with severe abdominal pain.  After starting patient on prednisone today patient came for follow-up is significant relief in the pain Has not taken any pain medication for last 2 weeks. Patient is on tapering dose of prednisone.  Due for rituximab in January. She does not want any aggressive chemotherapy  Abdominal pain is improved.  Patient  is on prednisone 20 mg here for maintenance rituximab therapy No nausea no vomiting no diarrhea. REVIEW OF SYSTEMS:   Gen. status: Patient's pain is improved.   HEENT: Denies any headache or dizziness soreness in the mouth GI: No abdominal pain is improved patient is not taking any pain medication did no nausea.  No vomiting.  No diarrhea. GU: No dysuria or hematuria Skin: No rash Neurological system no headache.no dizziness. no other focal symptoms Lower extremity no edema All other systems have been reviewed    As per HPI. Otherwise, a complete review of systems is negatve.  PAST MEDICAL HISTORY: Patient's past medical history includes his and had lumpectomy in the past.  Bladder surgery.  Hysterectomy bilateral cataract surgery.   FAMILY HISTORY NO FAMILY HISTORY OF CARCINOMA OF BREAST OVARY OR COLON CANCER  GYNECOLOGIC HISTORY:  No LMP recorded.  And had a hysterectomy   ADVANCED DIRECTIVES:  patien s considering advanced healthcare directive  HEALTH MAINTENANCE: Social History  Substance Use Topics  . Smoking status: Never Smoker   . Smokeless tobacco: None  . Alcohol Use: No      Allergies  Allergen Reactions  . Iron Nausea And Vomiting  . Codeine Rash    Current Outpatient Prescriptions  Medication Sig Dispense Refill  . Calcium Carbonate-Vitamin D (CALCIUM + D PO)     . cyanocobalamin 1000 MCG tablet Take 100 mcg by mouth every 30 (thirty) days.    Marland Kitchen gabapentin (NEURONTIN) 100 MG capsule  Take 1 capsule (100 mg total) by mouth 2 (two) times daily. 60 capsule 3  . HYDROcodone-acetaminophen (NORCO/VICODIN) 5-325 MG tablet 0.5 - 1 tab 4 times a day as needed for pain 60 tablet 0  . latanoprost (XALATAN) 0.005 % ophthalmic solution     . omeprazole (PRILOSEC) 20 MG capsule Take 1 capsule (20 mg total) by mouth 2 (two) times daily before a meal. 60 capsule 3  . polyethylene glycol powder (GLYCOLAX/MIRALAX) powder Take 1 Container by mouth once.    . predniSONE  (DELTASONE) 10 MG tablet Take as directed by MD 100 tablet 0   No current facility-administered medications for this visit.   Facility-Administered Medications Ordered in Other Visits  Medication Dose Route Frequency Provider Last Rate Last Dose  . sodium chloride 0.9 % injection 10 mL  10 mL Intracatheter PRN Forest Gleason, MD        OBJECTIVE: PHYSICAL EXAM: Gen. status: Alert and oriented lady not in any acute distress.  Pain is improved. HEENT: Hard palpable masses completely resolved.  In the neck.   Swelling in the left upper extremity is improved  Abdomen: Soft palpable mass persist might have decreased in size examined Mass is still palpable but not painful Neurologically, the patient was awake, alert, and oriented to person, place and time. There were no obvious focal neurologic abnormalities. Examination of the chest was unremarkable. There were no bony deformities, no asymmetry, and no other abnormalities. Cardiac exam revealed the PMI to be normally situated and sized. The rhythm was regular and no extrasystoles were noted during several minutes of auscultation. The first and second heart sounds were normal and physiologic splitting of the second heart sound was noted. There were no murmurs, rubs, clicks, or gallops. Lower extremity no swelling  Filed Vitals:   11/07/15 0926  BP: 181/71  Pulse: 85  Temp: 96.8 F (36 C)  Resp: 18     Body mass index is 17.74 kg/(m^2).    Lab data has been reviewed CBC Latest Ref Rng 11/07/2015 09/17/2015 08/19/2015  WBC 3.6 - 11.0 K/uL 8.4 4.6 3.8  Hemoglobin 12.0 - 16.0 g/dL 13.2 12.2 11.7(L)  Hematocrit 35.0 - 47.0 % 39.9 36.9 35.7  Platelets 150 - 440 K/uL 127(L) 139(L) 139(L)                                                                                 STUDIES: No results found.  ASSESSMENT:  Patient had extensive lymphadenopathy with left supraclavicular area as well as abdominal mass Biopsies  positive for diffuse B large cell lymphoma,  stage III Previous history of carcinoma of bladder, .  All lab data  has been reviewed  Patient has significant clinical improvement with rituximab therapy Because of bulky disease would like  to go with maintenance Rituxan therapy  PLAN:   Abdominal pain is improved.  We will continue prednisone 20 mg daily after patient finished his tapering dose We will continue patient on prednisone 20 mg.  Abdominal pain is significantly improved patient is not eager to get any kind of chemotherapy.  We will continue rituximab Reevaluate patient in 4 weeks.  We will try to taper prednisone off  maintaining control over pain. If abdominal pain continues possibility of radiation therapy for palliation can be considered. All lab data has been reviewed  Other lab data has been reviewed  Discussed situation with patient and family  Duration of visit is 20 minutes and 50% of time was spent discussing VARIOUS  options including chemotherapy and possibility of radiation therapy  Particularly pain is not controlled with prednisone therapy Forest Gleason, MD   11/07/2015 9:52 AM

## 2015-11-08 ENCOUNTER — Ambulatory Visit: Payer: Medicare HMO | Admitting: Family Medicine

## 2015-11-11 DIAGNOSIS — H10503 Unspecified blepharoconjunctivitis, bilateral: Secondary | ICD-10-CM | POA: Diagnosis not present

## 2015-11-13 ENCOUNTER — Ambulatory Visit (INDEPENDENT_AMBULATORY_CARE_PROVIDER_SITE_OTHER): Payer: PPO | Admitting: Family Medicine

## 2015-11-13 ENCOUNTER — Encounter: Payer: Self-pay | Admitting: Family Medicine

## 2015-11-13 VITALS — BP 136/74 | HR 89 | Temp 98.4°F | Ht 66.0 in | Wt 114.2 lb

## 2015-11-13 DIAGNOSIS — R7303 Prediabetes: Secondary | ICD-10-CM | POA: Insufficient documentation

## 2015-11-13 DIAGNOSIS — H109 Unspecified conjunctivitis: Secondary | ICD-10-CM | POA: Insufficient documentation

## 2015-11-13 DIAGNOSIS — G629 Polyneuropathy, unspecified: Secondary | ICD-10-CM

## 2015-11-13 DIAGNOSIS — R7309 Other abnormal glucose: Secondary | ICD-10-CM | POA: Diagnosis not present

## 2015-11-13 DIAGNOSIS — C8330 Diffuse large B-cell lymphoma, unspecified site: Secondary | ICD-10-CM

## 2015-11-13 DIAGNOSIS — R739 Hyperglycemia, unspecified: Secondary | ICD-10-CM

## 2015-11-13 LAB — GLUCOSE, RANDOM: GLUCOSE: 122 mg/dL — AB (ref 70–99)

## 2015-11-13 LAB — HEMOGLOBIN A1C: Hgb A1c MFr Bld: 6.1 % (ref 4.6–6.5)

## 2015-11-13 NOTE — Assessment & Plan Note (Signed)
Continue to follow with Dr. Oliva Bustard.

## 2015-11-13 NOTE — Assessment & Plan Note (Signed)
Elevated on last BMP. Asymptomatic. Likely related to prednisone. We'll check a random blood glucose today and an A1c.

## 2015-11-13 NOTE — Patient Instructions (Signed)
Nice to meet you. Please monitor her feet. If you decide you want further workup for the likely neuropathy please let us know. Please continue to use her eyedrops per ophthalmology. Please continue to follow with her cancer physician. I'll see you back in 3 months.

## 2015-11-13 NOTE — Assessment & Plan Note (Signed)
Symptoms in her feet most consistent with neuropathy. She is neurologically intact. No history of diabetes. Discussed potential workup for this including testing for diabetes, B12, physical therapy, or medications. Patient declined all of these except for testing for diabetes as her blood sugar has been elevated recently. She is given return precautions.

## 2015-11-13 NOTE — Progress Notes (Signed)
Pre visit review using our clinic review tool, if applicable. No additional management support is needed unless otherwise documented below in the visit note. 

## 2015-11-13 NOTE — Progress Notes (Signed)
Patient ID: Lisa Sosa, female   DOB: 06/11/1923, 82 y.o.   MRN: [id number]  Lisa Rumps, MD Phone: [phone]  Lisa Sosa is a 82 y.o. female who presents today for new patient visit.  Patient presents to establish care today. She has no specific complaints today. She does note that her bilateral feet occasionally burn when she is on them for to long. She notes her legs intermittently feel weak though have never given out on her. Notes this has been like this for a couple of years. She denies history of diabetes. No pain in her knees. No back pain. Notes her symptoms resolve if she rests. These only occur if she's on her feet for too long.  She saw her ophthalmologist yesterday and was diagnosed with conjunctivitis and blepharitis. Notes some itching in her eyes and eye discharge. She has been placed on neomycin, polymyxin, and dexamethasone drops. No acute vision changes.  She is followed by the [facility] for diffuse large B-cell lymphoma. She is currently on prednisone for this. She is also on rituximab. She notes prednisone has helped significantly with the abdominal pain that she was having previously. No pain at this time.  On review of her lab work and appears that her blood glucose was elevated to 178 on last check. She denies polyuria and polydipsia. No history of diabetes. She is currently on prednisone.  Active Ambulatory Problems    Diagnosis Date Noted  . Acute cystitis 11/01/2012  . Calculus of kidney 11/01/2012  . 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 Malaise and fatigue 11/01/2012  . 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 Hematuria, microscopic 11/01/2012  . Excessive urination at night 11/01/2012  . Diffuse large B cell lymphoma ([place]) 03/17/2015  . 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 Microscopic hematuria 11/01/2012  . Urge incontinence of urine 11/01/2012  . Neuropathy ([place]) 11/13/2015    . Blood glucose elevated 11/13/2015  . Conjunctivitis 11/13/2015   Resolved Ambulatory Problems    Diagnosis Date Noted  . No Resolved Ambulatory Problems   Past Medical History  Diagnosis Date  . Cancer ([place]) 2011  . Chickenpox   . Glaucoma   . Elevated blood pressure   . History of blood transfusion     Family History  Problem Relation Age of Onset  . Alcoholism    . Cancer - Lung      Parent  . Cancer - Ovarian Mother   . Prostate cancer      Other relative  . Stroke      Other relative   . High blood pressure      Other relative  . Diabetes      Other relative    Social History   Social History  . Marital Status: Widowed    Spouse Name: N/A  . Number of Children: N/A  . Years of Education: N/A   Occupational History  . Not on file.   Social History Main Topics  . Smoking status: Never Smoker   . Smokeless tobacco: Not on file  . Alcohol Use: No  . Drug Use: No  . Sexual Activity: Not on file   Other Topics Concern  . Not on file   Social History Narrative    ROS   General:  Negative for nexplained weight loss, fever Skin: Negative for new or changing mole, sore that won't heal HEENT: Negative for trouble hearing, trouble seeing,  ringing in ears, mouth sores, hoarseness, change in voice, dysphagia. CV:  Negative for chest pain, dyspnea, edema, palpitations Resp: Negative for cough, dyspnea, hemoptysis GI: Negative for nausea, vomiting, diarrhea, constipation, abdominal pain, melena, hematochezia. GU: Negative for dysuria, incontinence, urinary hesitance, hematuria, vaginal or penile discharge, polyuria, sexual difficulty, lumps in testicle or breasts MSK: Negative for muscle cramps or aches, joint pain or swelling Neuro: Negative for headaches, weakness, numbness, dizziness, passing out/fainting Psych: Negative for depression, anxiety, memory problems  Objective  Physical Exam Filed Vitals:   11/13/15 1423  BP: 136/74  Pulse: 89  Temp:  98.4 F (36.9 C)    BP Readings from Last 3 Encounters:  11/13/15 136/74  11/07/15 181/71  09/30/15 144/75   Wt Readings from Last 3 Encounters:  11/13/15 114 lb 3.2 oz (51.801 kg)  11/07/15 113 lb 5.1 oz (51.4 kg)  09/30/15 112 lb 10.5 oz (51.1 kg)    Physical Exam  Constitutional: She is well-developed, well-nourished, and in no distress.  HENT:  Head: Normocephalic and atraumatic.  Right Ear: External ear normal.  Left Ear: External ear normal.  Mouth/Throat: Oropharynx is clear and moist. No oropharyngeal exudate.  Eyes: Pupils are equal, round, and reactive to light.  Mild conjunctival erythema with clear discharge  Neck: Neck supple.  Cardiovascular: Normal rate, regular rhythm and normal heart sounds.  Exam reveals no gallop and no friction rub.   No murmur heard. Pulmonary/Chest: Effort normal and breath sounds normal. No respiratory distress. She has no wheezes. She has no rales.  Abdominal: Soft. Bowel sounds are normal. She exhibits no distension. There is no tenderness. There is no rebound and no guarding.  Musculoskeletal: She exhibits no edema.  Neurological: She is alert.  Out of 5 strength in bilateral quads, hamstrings, plantar flexion, and dorsiflexion, sensation to light touch intact in bilateral lower extremities, patellar reflexes absent, monofilament testing normal in bilateral feet  Skin: Skin is warm and dry. She is not diaphoretic.  Psychiatric: Mood and affect normal.     Assessment/Plan:   Neuropathy (HCC) Symptoms in her feet most consistent with neuropathy. She is neurologically intact. No history of diabetes. Discussed potential workup for this including testing for diabetes, B12, physical therapy, or medications. Patient declined all of these except for testing for diabetes as her blood sugar has been elevated recently. She is given return precautions.  Blood glucose elevated Elevated on last BMP. Asymptomatic. Likely related to prednisone.  We'll check a random blood glucose today and an A1c.  Conjunctivitis Has been seen by ophthalmology for this yesterday. Is on eyedrops already. Advised to continue eyedrops and continue to monitor.  Diffuse large B cell lymphoma Continue to follow with Dr. Oliva Bustard.    Orders Placed This Encounter  Procedures  . Hemoglobin A1C  . Glucose, random    Lisa Sosa

## 2015-11-13 NOTE — Assessment & Plan Note (Signed)
Has been seen by ophthalmology for this yesterday. Is on eyedrops already. Advised to continue eyedrops and continue to monitor.

## 2015-11-19 DIAGNOSIS — H10503 Unspecified blepharoconjunctivitis, bilateral: Secondary | ICD-10-CM | POA: Diagnosis not present

## 2015-12-05 ENCOUNTER — Encounter: Payer: Self-pay | Admitting: Oncology

## 2015-12-05 ENCOUNTER — Inpatient Hospital Stay (HOSPITAL_BASED_OUTPATIENT_CLINIC_OR_DEPARTMENT_OTHER): Payer: PPO | Admitting: Oncology

## 2015-12-05 ENCOUNTER — Inpatient Hospital Stay: Payer: PPO | Attending: Oncology

## 2015-12-05 VITALS — BP 182/74 | HR 109 | Temp 97.3°F | Resp 18 | Wt 113.3 lb

## 2015-12-05 DIAGNOSIS — R109 Unspecified abdominal pain: Secondary | ICD-10-CM | POA: Diagnosis not present

## 2015-12-05 DIAGNOSIS — Z79899 Other long term (current) drug therapy: Secondary | ICD-10-CM | POA: Diagnosis not present

## 2015-12-05 DIAGNOSIS — C8330 Diffuse large B-cell lymphoma, unspecified site: Secondary | ICD-10-CM

## 2015-12-05 DIAGNOSIS — R630 Anorexia: Secondary | ICD-10-CM | POA: Diagnosis not present

## 2015-12-05 DIAGNOSIS — R221 Localized swelling, mass and lump, neck: Secondary | ICD-10-CM

## 2015-12-05 LAB — CBC WITH DIFFERENTIAL/PLATELET
BASOS ABS: 0 10*3/uL (ref 0–0.1)
Basophils Relative: 1 %
EOS PCT: 1 %
Eosinophils Absolute: 0.1 10*3/uL (ref 0–0.7)
HCT: 38.1 % (ref 35.0–47.0)
Hemoglobin: 12.9 g/dL (ref 12.0–16.0)
LYMPHS PCT: 4 %
Lymphs Abs: 0.4 10*3/uL — ABNORMAL LOW (ref 1.0–3.6)
MCH: 30.4 pg (ref 26.0–34.0)
MCHC: 33.8 g/dL (ref 32.0–36.0)
MCV: 90 fL (ref 80.0–100.0)
Monocytes Absolute: 0.4 10*3/uL (ref 0.2–0.9)
Monocytes Relative: 4 %
NEUTROS ABS: 7.7 10*3/uL — AB (ref 1.4–6.5)
NEUTROS PCT: 90 %
PLATELETS: 165 10*3/uL (ref 150–440)
RBC: 4.23 MIL/uL (ref 3.80–5.20)
RDW: 16.6 % — ABNORMAL HIGH (ref 11.5–14.5)
WBC: 8.5 10*3/uL (ref 3.6–11.0)

## 2015-12-05 LAB — COMPREHENSIVE METABOLIC PANEL
ALT: 22 U/L (ref 14–54)
AST: 17 U/L (ref 15–41)
Albumin: 3.5 g/dL (ref 3.5–5.0)
Alkaline Phosphatase: 53 U/L (ref 38–126)
Anion gap: 7 (ref 5–15)
BUN: 29 mg/dL — AB (ref 6–20)
CHLORIDE: 100 mmol/L — AB (ref 101–111)
CO2: 30 mmol/L (ref 22–32)
CREATININE: 1.03 mg/dL — AB (ref 0.44–1.00)
Calcium: 9.5 mg/dL (ref 8.9–10.3)
GFR calc Af Amer: 54 mL/min — ABNORMAL LOW (ref >60)
GFR, EST NON AFRICAN AMERICAN: 46 mL/min — AB (ref >60)
Glucose, Bld: 145 mg/dL — ABNORMAL HIGH (ref 65–99)
Potassium: 3.3 mmol/L — ABNORMAL LOW (ref 3.5–5.1)
Sodium: 137 mmol/L (ref 135–145)
Total Bilirubin: 0.6 mg/dL (ref 0.3–1.2)
Total Protein: 6.6 g/dL (ref 6.5–8.1)

## 2015-12-05 MED ORDER — PREDNISONE 20 MG PO TABS: ORAL_TABLET | ORAL | Status: DC

## 2015-12-05 MED ORDER — HYDROCODONE-ACETAMINOPHEN 5-325 MG PO TABS: ORAL_TABLET | ORAL | Status: DC

## 2015-12-05 MED ORDER — OMEPRAZOLE 20 MG PO CPDR: 20.0000 mg | DELAYED_RELEASE_CAPSULE | Freq: Two times a day (BID) | ORAL | Status: DC

## 2015-12-05 NOTE — Progress Notes (Signed)
[place] @ [facility] [phone]  Fax:(336) [place] OB: 11/17/1922  MR#: [id number]  [id number]  Patient Care Team: [facility] as PCP - General Seeplaputhur [name], MD as Consulting Physician (General Surgery) Provider Not In [place]:  Chief Complaint  Patient presents with  . Diffuse large B-Cell lymphoma    VISIT DIAGNOSIS:   Diffuse be last cell lymphoma  Oncology History   103.  82 year old lady presented with large left neck mass and abdominal palpable mass.  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  Patient did not want any chemotherapy so was started on prednisone followed by Rituxan in June of 2016 with excellent response 3.  Patient has finished 4 weekly cycle of rituximab on April 16, 2015 with excellent response   4.  Because of bulky disease in the past patient is getting maintenance Rituxan therapy started in September of 2016   Oncology Flowsheet 04/03/2015 04/10/2015 04/17/2015 06/19/2015 08/19/2015 09/17/2015 11/07/2015  dexamethasone (DECADRON) IV - - - - - 20 mg -  diphenhydrAMINE (BENADRYL) PO 50 mg 50 mg 50 mg 50 mg 50 mg - 25 mg  methylPREDNISolone sodium succinate 125 mg/2 mL (SOLU-MEDROL) IV - - - - - - -  riTUXimab (RITUXAN) IV 375 mg/m2 375 mg/m2 375 mg/m2 375 mg/m2 375 mg/m2 - 600 mg    INTERVAL HISTORY: A 82 year old right ear was referred to me for further evaluation regarding the left supraclavicular and abdominal mass.    therapy No nausea no vomiting no diarrhea. Patient is here for ongoing evaluation and treatment consideration.  Having increasing pain in the abdominal area even on 20 mg prednisone.  Patient rates the scale to 7 or 8.  Present pain medication helps somewhat. Poor appetite declining condition.  REVIEW OF SYSTEMS:   Gen. status: Patient's pain is improved.   HEENT: Denies  any headache or dizziness soreness in the mouth GI: No abdominal pain is improved patient is not taking any pain medication did no nausea.  No vomiting.  No diarrhea. GU: No dysuria or hematuria Skin: No rash Neurological system no headache.no dizziness. no other focal symptoms Lower extremity no edema All other systems have been reviewed    As per HPI. Otherwise, a complete review of systems is negatve.  PAST MEDICAL HISTORY: Patient's past medical history includes his and had lumpectomy in the past.  Bladder surgery.  Hysterectomy bilateral cataract surgery.   FAMILY HISTORY NO FAMILY HISTORY OF CARCINOMA OF BREAST OVARY OR COLON CANCER  GYNECOLOGIC HISTORY:  No LMP recorded. Patient has had a hysterectomy.  And had a hysterectomy   ADVANCED DIRECTIVES:  patien s considering advanced healthcare directive  HEALTH MAINTENANCE: Social History  Substance Use Topics  . Smoking status: Never Smoker   . Smokeless tobacco: None  . Alcohol Use: No      Allergies  Allergen Reactions  . Iron Nausea And Vomiting  . Codeine Rash    Current Outpatient Prescriptions  Medication Sig Dispense Refill  . Calcium Carbonate-Vitamin D (CALCIUM + D PO)     . cyanocobalamin 1000 MCG tablet Take 100 mcg by mouth every 30 (thirty) days.    Marland Kitchen gabapentin (NEURONTIN) 100 MG capsule Take 1 capsule (100 mg total) by mouth 2 (two) times daily. 60 capsule 3  . HYDROcodone-acetaminophen (NORCO/VICODIN) 5-325 MG tablet 0.5 - 1  tab 4 times a day as needed for pain 60 tablet 0  . latanoprost (XALATAN) 0.005 % ophthalmic solution     . omeprazole (PRILOSEC) 20 MG capsule Take 1 capsule (20 mg total) by mouth 2 (two) times daily before a meal. 60 capsule 3  . polyethylene glycol powder (GLYCOLAX/MIRALAX) powder Take 1 Container by mouth once.    . predniSONE (DELTASONE) 10 MG tablet TK UTD BY MD  0  . predniSONE (DELTASONE) 20 MG tablet Take 2 tablets by mouth daily for 5 days, then take 1 tablet by  mouth daily 30 tablet 3  . neomycin-polymyxin b-dexamethasone (MAXITROL) 3.5-10000-0.1 SUSP SHAKE LQ AND INT 1 GTT IN OU QID  0   No current facility-administered medications for this visit.   Facility-Administered Medications Ordered in Other Visits  Medication Dose Route Frequency Provider Last Rate Last Dose  . sodium chloride 0.9 % injection 10 mL  10 mL Intracatheter PRN Forest Gleason, MD        OBJECTIVE: PHYSICAL EXAM: Gen. status: Alert and oriented lady not in any acute distress.  Pain is improved. HEENT: Hard palpable masses completely resolved.  In the neck.   Swelling in the left upper extremity is improved  Abdomen: Soft palpable mass persist might have decreased in size examined Mass is still palpable but not painful Neurologically, the patient was awake, alert, and oriented to person, place and time. There were no obvious focal neurologic abnormalities. Examination of the chest was unremarkable. There were no bony deformities, no asymmetry, and no other abnormalities. Cardiac exam revealed the PMI to be normally situated and sized. The rhythm was regular and no extrasystoles were noted during several minutes of auscultation. The first and second heart sounds were normal and physiologic splitting of the second heart sound was noted. There were no murmurs, rubs, clicks, or gallops. Lower extremity no swelling  Filed Vitals:   12/05/15 1103  BP: 182/74  Pulse: 109  Temp: 97.3 F (36.3 C)  Resp: 18     Body mass index is 18.3 kg/(m^2).    Lab data has been reviewed CBC Latest Ref Rng 12/05/2015 11/07/2015 09/17/2015  WBC 3.6 - 11.0 K/uL 8.5 8.4 4.6  Hemoglobin 12.0 - 16.0 g/dL 12.9 13.2 12.2  Hematocrit 35.0 - 47.0 % 38.1 39.9 36.9  Platelets 150 - 440 K/uL 165 127(L) 139(L)                                                                                 STUDIES: No results found.  ASSESSMENT:  Patient had extensive lymphadenopathy with  left supraclavicular area as well as abdominal mass Biopsies positive for diffuse B large cell lymphoma,  stage III Previous history of carcinoma of bladder, .  All lab data  has been reviewed  Patient has significant clinical improvement with rituximab therapy Because of bulky disease would like  to go with maintenance Rituxan therapy  PLAN:   Diffuse B large cell lymphoma  \Progressing disease I had a long discussion with patient and family  Following options were discussed    1. proceed with CT scan of abdomen and consider palliative radiation therapy  2. consider bendamustine or Mini  R CHOP chemotherapy . 3.  Palliative chemotherapy and hospice care for continuing pain management if patient does not want any further chemotherapy  Patient and family is going to talk about all these options going to get a CT scan done to evaluate patient with Dr. Donella Stade radiation oncologist Total duration of visit was 25 minutes.  50% or more time was spent in counseling patient and family regarding prognosis and options of treatment and available resources  Forest Gleason, MD   12/05/2015 2:00 PM

## 2015-12-10 DIAGNOSIS — H401112 Primary open-angle glaucoma, right eye, moderate stage: Secondary | ICD-10-CM | POA: Diagnosis not present

## 2015-12-13 ENCOUNTER — Telehealth: Payer: Self-pay | Admitting: *Deleted

## 2015-12-13 ENCOUNTER — Ambulatory Visit
Admission: RE | Admit: 2015-12-13 | Discharge: 2015-12-13 | Disposition: A | Discharge status: Home / Self Care | Payer: PPO | Source: Ambulatory Visit | Attending: Oncology | Admitting: Oncology

## 2015-12-13 DIAGNOSIS — C8330 Diffuse large B-cell lymphoma, unspecified site: Secondary | ICD-10-CM | POA: Diagnosis not present

## 2015-12-13 DIAGNOSIS — K769 Liver disease, unspecified: Secondary | ICD-10-CM | POA: Insufficient documentation

## 2015-12-13 DIAGNOSIS — R918 Other nonspecific abnormal finding of lung field: Secondary | ICD-10-CM | POA: Diagnosis not present

## 2015-12-13 MED ORDER — IOHEXOL 300 MG/ML  SOLN
80.0000 mL | Freq: Once | INTRAMUSCULAR | Status: AC | PRN
  Administered 2015-12-13: 80 mL via INTRAVENOUS

## 2015-12-13 MED ORDER — CIPROFLOXACIN HCL 500 MG PO TABS: 500.0000 mg | ORAL_TABLET | Freq: Two times a day (BID) | ORAL | Status: DC

## 2015-12-13 MED ORDER — METRONIDAZOLE 500 MG PO TABS: 500.0000 mg | ORAL_TABLET | Freq: Three times a day (TID) | ORAL | Status: DC

## 2015-12-13 NOTE — Telephone Encounter (Signed)
CT scan showed diverticulitis per Dr. Oliva Bustard. MD would like for patient to start cipro and flagyl to treat diverticulitis. Both prescriptions will be called into pharmacy.

## 2015-12-13 NOTE — Telephone Encounter (Signed)
Called and spoke to patient's daughter, Delcie Roch, to inform her that CT showed diverticulitis and MD has called Cipro and Flagyl to pharmacy for patient.  Verbalized understanding.

## 2015-12-17 ENCOUNTER — Ambulatory Visit
Admission: RE | Admit: 2015-12-17 | Discharge: 2015-12-17 | Disposition: A | Discharge status: Home / Self Care | Payer: PPO | Source: Ambulatory Visit | Attending: Radiation Oncology | Admitting: Radiation Oncology

## 2015-12-17 ENCOUNTER — Encounter: Payer: Self-pay | Admitting: Radiation Oncology

## 2015-12-17 ENCOUNTER — Inpatient Hospital Stay: Payer: PPO | Attending: Oncology | Admitting: Oncology

## 2015-12-17 ENCOUNTER — Encounter: Payer: Self-pay | Admitting: Oncology

## 2015-12-17 VITALS — BP 130/74 | HR 91 | Resp 18 | Wt 117.0 lb

## 2015-12-17 VITALS — BP 130/64 | HR 91 | Temp 96.1°F | Resp 18 | Wt 117.3 lb

## 2015-12-17 DIAGNOSIS — I959 Hypotension, unspecified: Secondary | ICD-10-CM | POA: Insufficient documentation

## 2015-12-17 DIAGNOSIS — C8330 Diffuse large B-cell lymphoma, unspecified site: Secondary | ICD-10-CM | POA: Diagnosis not present

## 2015-12-17 DIAGNOSIS — R531 Weakness: Secondary | ICD-10-CM | POA: Diagnosis not present

## 2015-12-17 DIAGNOSIS — M25511 Pain in right shoulder: Secondary | ICD-10-CM | POA: Diagnosis not present

## 2015-12-17 DIAGNOSIS — R110 Nausea: Secondary | ICD-10-CM | POA: Diagnosis not present

## 2015-12-17 DIAGNOSIS — Z79899 Other long term (current) drug therapy: Secondary | ICD-10-CM | POA: Insufficient documentation

## 2015-12-17 DIAGNOSIS — E860 Dehydration: Secondary | ICD-10-CM | POA: Diagnosis not present

## 2015-12-17 DIAGNOSIS — Z7952 Long term (current) use of systemic steroids: Secondary | ICD-10-CM | POA: Diagnosis not present

## 2015-12-17 DIAGNOSIS — R1900 Intra-abdominal and pelvic swelling, mass and lump, unspecified site: Secondary | ICD-10-CM | POA: Diagnosis not present

## 2015-12-17 DIAGNOSIS — R630 Anorexia: Secondary | ICD-10-CM | POA: Diagnosis not present

## 2015-12-17 DIAGNOSIS — R634 Abnormal weight loss: Secondary | ICD-10-CM | POA: Insufficient documentation

## 2015-12-17 DIAGNOSIS — R109 Unspecified abdominal pain: Secondary | ICD-10-CM | POA: Insufficient documentation

## 2015-12-17 DIAGNOSIS — M7989 Other specified soft tissue disorders: Secondary | ICD-10-CM | POA: Insufficient documentation

## 2015-12-17 DIAGNOSIS — M549 Dorsalgia, unspecified: Secondary | ICD-10-CM | POA: Diagnosis not present

## 2015-12-17 DIAGNOSIS — R5383 Other fatigue: Secondary | ICD-10-CM

## 2015-12-17 DIAGNOSIS — G4700 Insomnia, unspecified: Secondary | ICD-10-CM | POA: Insufficient documentation

## 2015-12-17 DIAGNOSIS — Z8551 Personal history of malignant neoplasm of bladder: Secondary | ICD-10-CM | POA: Diagnosis not present

## 2015-12-17 DIAGNOSIS — D72829 Elevated white blood cell count, unspecified: Secondary | ICD-10-CM | POA: Diagnosis not present

## 2015-12-17 MED ORDER — PREDNISONE 20 MG PO TABS: ORAL_TABLET | ORAL | Status: DC

## 2015-12-17 MED ORDER — METRONIDAZOLE 250 MG PO TABS: 250.0000 mg | ORAL_TABLET | Freq: Three times a day (TID) | ORAL | Status: DC

## 2015-12-17 NOTE — Progress Notes (Addendum)
[place] @ [facility] Telephone:([phone]  Fax:(336) [place] OB: 1923-03-15  MR#: [id number]  [id number]  Patient Care Team: [name], MD as PCP - General (Family Medicine) Seeplaputhur [name], MD as Consulting Physician (General Surgery) Provider Not In [place]:  Chief Complaint  Patient presents with  . Large B-Cell Lymphoma    VISIT DIAGNOSIS:   Diffuse be last cell lymphoma  Oncology History   24.  82 year old lady presented with large left neck mass and abdominal palpable mass.  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  Patient did not want any chemotherapy so was started on prednisone followed by Rituxan in June of 2016 with excellent response 3.  Patient has finished 4 weekly cycle of rituximab on April 16, 2015 with excellent response   4.  Because of bulky disease in the past patient is getting maintenance Rituxan therapy started in September of 2016   Oncology Flowsheet 04/03/2015 04/10/2015 04/17/2015 06/19/2015 08/19/2015 09/17/2015 11/07/2015  dexamethasone (DECADRON) IV - - - - - 20 mg -  diphenhydrAMINE (BENADRYL) PO 50 mg 50 mg 50 mg 50 mg 50 mg - 25 mg  methylPREDNISolone sodium succinate 125 mg/2 mL (SOLU-MEDROL) IV - - - - - - -  riTUXimab (RITUXAN) IV 375 mg/m2 375 mg/m2 375 mg/m2 375 mg/m2 375 mg/m2 - 600 mg    INTERVAL HISTORY:  82 year old lady with a history of diffuse large cell lymphoma increasing abdominal pain.  Recently had a CT scan and PET scan done which revealed that lymphoma was gradually improving however patient had diverticulitis and small abscess. Patient was started on Cipro and Flagyl.  Patient was also evaluated today by radiation oncologist I discussed situation with him and we decided to hold up any further radiation therapy CT scan was also discussed with the radiologist and abscess is small and  probably does not require any drainage as per radiologist.  Mammogram to be followed after course of antibiotic was finished in the patient's symptoms persist. According to family pain is improved somewhat. Patient continues to problem going to sleep. Her glaucoma is getting worse and blood sugar is slightly high may be related with prednisone.       Patient states she is not sleeping at night. States she is having a BM every time she eats. Recently started on Cipro and Flagyl for UTI. Also had a change in her eye drops for glaucoma.    REVIEW OF SYSTEMS:   Gen. status:Elderly lady feeling somewhat weak and tired and wheelchair. HEENT: Denies any headache.  No difficulty swallowing.  No soreness in the mouth. Patient is hard of hearing Lungs: Shortness of breath on exertion GU: No dysuria or hematuria GI: Left lower abdominal pain is improved.  CT scan shows diverticular abscess as described above Skin: No rash Neurological system: No headache no dizziness Lower extremity no swelling All other systems have been reviewed     As per HPI. Otherwise, a complete review of systems is negatve.  PAST MEDICAL HISTORY: Patient's past medical history includes his and had lumpectomy in the past.  Bladder surgery.  Hysterectomy bilateral cataract surgery.   FAMILY HISTORY NO FAMILY HISTORY OF CARCINOMA OF BREAST OVARY OR COLON CANCER  GYNECOLOGIC HISTORY:  No LMP recorded. Patient has had a hysterectomy.  And had a hysterectomy   ADVANCED DIRECTIVES:  patien s  considering advanced healthcare directive  HEALTH MAINTENANCE: Social History  Substance Use Topics  . Smoking status: Never Smoker   . Smokeless tobacco: Not on file  . Alcohol Use: No      Allergies  Allergen Reactions  . Iron Nausea And Vomiting  . Codeine Rash    Current Outpatient Prescriptions  Medication Sig Dispense Refill  . Calcium Carbonate-Vitamin D (CALCIUM + D PO)     . ciprofloxacin (CIPRO) 500 MG  tablet Take 1 tablet (500 mg total) by mouth 2 (two) times daily. 14 tablet 0  . cyanocobalamin 1000 MCG tablet Take 100 mcg by mouth every 30 (thirty) days.    Marland Kitchen gabapentin (NEURONTIN) 100 MG capsule Take 1 capsule (100 mg total) by mouth 2 (two) times daily. 60 capsule 3  . HYDROcodone-acetaminophen (NORCO/VICODIN) 5-325 MG tablet 0.5 - 1 tab 4 times a day as needed for pain 60 tablet 0  . latanoprost (XALATAN) 0.005 % ophthalmic solution     . metroNIDAZOLE (FLAGYL) 500 MG tablet Take 1 tablet (500 mg total) by mouth 3 (three) times daily. 21 tablet 0  . neomycin-polymyxin b-dexamethasone (MAXITROL) 3.5-10000-0.1 SUSP SHAKE LQ AND INT 1 GTT IN OU QID  0  . omeprazole (PRILOSEC) 20 MG capsule Take 1 capsule (20 mg total) by mouth 2 (two) times daily before a meal. 60 capsule 3  . polyethylene glycol powder (GLYCOLAX/MIRALAX) powder Take 1 Container by mouth once.    . predniSONE (DELTASONE) 10 MG tablet TK UTD BY MD  0  . predniSONE (DELTASONE) 20 MG tablet Take 2 tablets by mouth daily for 5 days, then take 1 tablet by mouth daily 30 tablet 3   No current facility-administered medications for this visit.   Facility-Administered Medications Ordered in Other Visits  Medication Dose Route Frequency Provider Last Rate Last Dose  . sodium chloride 0.9 % injection 10 mL  10 mL Intracatheter PRN Forest Gleason, MD        OBJECTIVE: Gen. status: Patient is alert oriented in the wheelchair. GI: Abdomen is soft tenderness in left lower quadrant has improved no ascites  HEENT: No soreness in the mouth. Lymphatic system: Supraclavicular, cervical, axillary, inguinal lymph nodes are not palpable Lungs: AIR entry chronic post side or crepitation or rhonchi no rales Cardiac: Sounds are normal.  Neurological Neurological system: Patient is hard of hearing ,but no localizing signs. Lower extremityswelling Skin: No rash Other system and have been reviewed and examined and no abnormality  detected   Filed Vitals:   12/17/15 1144  BP: 130/74  Pulse: 91  Resp: 18     Body mass index is 18.89 kg/(m^2).    Lab data has been reviewed CBC Latest Ref Rng 12/05/2015 11/07/2015 09/17/2015  WBC 3.6 - 11.0 K/uL 8.5 8.4 4.6  Hemoglobin 12.0 - 16.0 g/dL 12.9 13.2 12.2  Hematocrit 35.0 - 47.0 % 38.1 39.9 36.9  Platelets 150 - 440 K/uL 165 127(L) 139(L)                                                                                 STUDIES: Ct Abdomen Pelvis W Contrast  12/13/2015  CLINICAL DATA:  Diffuse large B-cell  lymphoma. EXAM: CT ABDOMEN AND PELVIS WITH CONTRAST TECHNIQUE: Multidetector CT imaging of the abdomen and pelvis was performed using the standard protocol following bolus administration of intravenous contrast. CONTRAST:  73mL OMNIPAQUE IOHEXOL 300 MG/ML  SOLN COMPARISON:  09/12/2015 FINDINGS: Lower chest: Focal area of interstitial and airspace disease is identified in the anterior right lower lobe, new in the interval. Hepatobiliary: 5 mm low-density lesion in medial segment left liver is stable. Liver parenchyma otherwise unremarkable. There is no evidence for gallstones, gallbladder wall thickening, or pericholecystic fluid. Common bile duct measures 9 mm in diameter, not substantially changed in the interval. Pancreas: There is diffuse distention of the main pancreatic duct, as before with stable appearance. No discrete or focal pancreatic mass lesion is evident. Spleen: No splenomegaly. No focal mass lesion. Adrenals/Urinary Tract: No adrenal nodule or mass. No enhancing lesion in either kidney. Stable tiny hypo enhancing cortical lesions bilaterally, likely representing cysts. No evidence for hydroureter. The urinary bladder appears normal for the degree of distention. Stomach/Bowel: Stomach is nondistended. No gastric wall thickening. No evidence of outlet obstruction. Duodenum is normally positioned as is the ligament of Treitz. No small bowel  wall thickening. No small bowel dilatation. The terminal ileum is normal. The appendix is not visualized, but there is no edema or inflammation in the region of the cecum. Diverticular changes are noted in the left colon. In the proximal to mid sigmoid colon, there is an area of very subtle pericolonic edema/inflammation (see image 60 series 2) with associated rim enhancing collection in the colonic wall (well seen on coronal image 63 of series 6). These imaging features are suspicious for diverticulitis, potentially with any small intramural abscess. There is no evidence for extraluminal gas. No gross edema or fluid in the root of the sigmoid mesocolon. Vascular/Lymphatic: The 5.0 x 5.7 cm central abdominal mass lesions seen on the previous study has almost completely resolved. On today's exam only a small amount of soft tissue is seen in the central mesentery (image 20 series 2). This circumferentially encases the SMA and the soft tissue lesion now measures 1.5 x 2.2 cm. A small 8 x 9 mm focus of soft tissue attenuation is seen to the left of the abdominal aorta, at the location of the previous retroperitoneal lymphadenopathy. There is abdominal aortic atherosclerosis without aneurysm. No pelvic sidewall lymphadenopathy. Reproductive: Uterus is surgically absent. There is no adnexal mass. Other: No intraperitoneal free fluid. Musculoskeletal: Bone windows reveal no worrisome lytic or sclerotic osseous lesions. IMPRESSION: 1. Interval development of subtle pericolonic edema/inflammation in the proximal to mid sigmoid colon with a small 1 x 2 cm rim enhancing structure in the wall the colon that could be a tiny intramural abscess. There is no evidence for perforation of the colon in this region. There is no pericolonic abscess in the sigmoid mesocolon that would require drainage. 2. Marked interval response to therapy with the 6 cm mass seen in the central abdomen on the previous study now measuring 15 x 22 mm.  Retroperitoneal involvement has also almost completely resolved. 3. Small area of focal airspace disease in the anterior right lower lobe. Imaging features are suspicious for pneumonia. I discussed these findings by telephone with Dr. Oliva Bustard at approximately 1016 hours on 12/13/2015. Electronically Signed   By: Misty Stanley M.D.   On: 12/13/2015 10:18    ASSESSMENT:  Patient had extensive lymphadenopathy with left supraclavicular area as well as abdominal mass Biopsies positive for diffuse B large cell lymphoma,  stage  III Previous history of carcinoma of bladder, .  All lab data  has been reviewed  Patient has significant clinical improvement with rituximab therapy Because of bulky disease would like  to go with maintenance Rituxan therapy  PLAN:   Diffuse B large cell lymphoma  His CT scan has been reviewed independently and shows no evidence of progressive disease.  I discussion with the radiation oncologist as well as radiologist. Left loweR quadrant abdominal pain may be related to diverticulitis will proceed with 15 days of Flagyl.  After 7 days of Flagyl and Cipro patient had significant relief in pain. SHE HAS  significant side effect of prednisone with high blood sugar and increased pressure in the eyes so prednisone be tapered off with 20 mg Monday Wednesday Friday and discontinue  If patient's general condition improves then patient will receive maintenance rituximab therapy on March 23 as planned after checking appropriate blood tests. Has been discussed with the family. Daughters were present.  Hold plan including tapering of steroids was discussed at length Total duration of visit was 25   minutes.  50% or more time was spent in counseling patient and family regarding prognosis and options of treatment and available resources Forest Gleason, MD   12/17/2015 11:46 AM

## 2015-12-17 NOTE — Consult Note (Signed)
Except an outstanding is perfect of Radiation Oncology NEW PATIENT EVALUATION  Name: Lisa Sosa  MRN: [id number]  Date:   12/17/2015     DOB: 18-Nov-1922   This 82 y.o. female patient presents to the clinic for initial evaluation of malignant lymphoma diffuse B cell type.  REFERRING PHYSICIAN: Pa, Arispe COMPLAINT:  Chief Complaint  Patient presents with  . Cancer    Pt is here for initial consultation of lymphoma    DIAGNOSIS: The encounter diagnosis was Diffuse large B-cell lymphoma, unspecified body region [facility]).   PREVIOUS INVESTIGATIONS:  CT scans reviewed Clinical notes reviewed Pathology reviewed  HPI: Patient is an 82 year old female originally presented with a large left neck mass and abdominal palpable nodularity biopsy of left neck mass was positive for malignant lymphoma diffuse B cell type clinically staged 3 in June 2016. She was treated conservatively with prednisone followed by Rituxan with excellent response. She has been on maintenance Rituxan since September 2016. Recently she was having some abdominal discomfort and repeat CT scan of abdomen and pelvis was performed showing interdevelopment of pericolonic edema and inflammation in the mid sigmoid colon consistent with diverticulitis. The central 6 cm mass in the abdomen had resolved to 1.5 cm mass and retroperitoneal adenopathy all is completely resolved. She also a small area of focal airspace disease probably pneumonia in the right lower lobe. I been asked to evaluate the patient for possible palliative radiation therapy she feels generally weak and frail. Havingany pain or discomfort in the head and neck region abdomen. She's currently on Flagyl with improvement of some of her lower abdominal pain.  PLANNED TREATMENT REGIMEN: Observation  PAST MEDICAL HISTORY:  has a past medical history of Chickenpox; Glaucoma; Elevated blood pressure; History of blood transfusion; Cancer [facility]) (2011); and  Diffuse large B cell lymphoma ([place]) (03/17/2015).    PAST SURGICAL HISTORY:  Past Surgical History  Procedure Laterality Date  . Abdominal hysterectomy  1978    For fibroids  . Breast biopsy Right 1990  . Transurethral resection of bladder tumor  2011     FAMILY HISTORY: family history includes Cancer - Ovarian in her mother.  SOCIAL HISTORY:  reports that she has never smoked. She does not have any smokeless tobacco history on file. She reports that she does not drink alcohol or use illicit drugs.  ALLERGIES: Iron and Codeine  MEDICATIONS:  Current Outpatient Prescriptions  Medication Sig Dispense Refill  . Calcium Carbonate-Vitamin D (CALCIUM + D PO)     . ciprofloxacin (CIPRO) 500 MG tablet Take 1 tablet (500 mg total) by mouth 2 (two) times daily. 14 tablet 0  . cyanocobalamin 1000 MCG tablet Take 100 mcg by mouth every 30 (thirty) days.    Marland Kitchen gabapentin (NEURONTIN) 100 MG capsule Take 1 capsule (100 mg total) by mouth 2 (two) times daily. 60 capsule 3  . HYDROcodone-acetaminophen (NORCO/VICODIN) 5-325 MG tablet 0.5 - 1 tab 4 times a day as needed for pain 60 tablet 0  . latanoprost (XALATAN) 0.005 % ophthalmic solution     . metroNIDAZOLE (FLAGYL) 500 MG tablet Take 1 tablet (500 mg total) by mouth 3 (three) times daily. 21 tablet 0  . neomycin-polymyxin b-dexamethasone (MAXITROL) 3.5-10000-0.1 SUSP SHAKE LQ AND INT 1 GTT IN OU QID  0  . omeprazole (PRILOSEC) 20 MG capsule Take 1 capsule (20 mg total) by mouth 2 (two) times daily before a meal. 60 capsule 3  . polyethylene glycol powder (GLYCOLAX/MIRALAX) powder  Take 1 Container by mouth once.    . predniSONE (DELTASONE) 10 MG tablet TK UTD BY MD  0  . predniSONE (DELTASONE) 20 MG tablet Take 2 tablets by mouth daily for 5 days, then take 1 tablet by mouth daily 30 tablet 3   No current facility-administered medications for this encounter.   Facility-Administered Medications Ordered in Other Encounters  Medication Dose Route  Frequency Provider Last Rate Last Dose  . sodium chloride 0.9 % injection 10 mL  10 mL Intracatheter PRN Forest Gleason, MD        ECOG PERFORMANCE STATUS:  1 - Symptomatic but completely ambulatory  REVIEW OF SYSTEMS: Except for the frail weakness and resolving abdominal pain  Patient denies any weight loss, fatigue, weakness, fever, chills or night sweats. Patient denies any loss of vision, blurred vision. Patient denies any ringing  of the ears or hearing loss. No irregular heartbeat. Patient denies heart murmur or history of fainting. Patient denies any chest pain or pain radiating to her upper extremities. Patient denies any shortness of breath, difficulty breathing at night, cough or hemoptysis. Patient denies any swelling in the lower legs. Patient denies any nausea vomiting, vomiting of blood, or coffee ground material in the vomitus. Patient denies any stomach pain. Patient states has had normal bowel movements no significant constipation or diarrhea. Patient denies any dysuria, hematuria or significant nocturia. Patient denies any problems walking, swelling in the joints or loss of balance. Patient denies any skin changes, loss of hair or loss of weight. Patient denies any excessive worrying or anxiety or significant depression. Patient denies any problems with insomnia. Patient denies excessive thirst, polyuria, polydipsia. Patient denies any swollen glands, patient denies easy bruising or easy bleeding. Patient denies any recent infections, allergies or URI. Patient "s visual fields have not changed significantly in recent time.    PHYSICAL EXAM: BP 130/64 mmHg  Pulse 91  Temp(Src) 96.1 F (35.6 C)  Resp 18  Wt 117 lb 4.6 oz (53.2 kg) Frail elderly female in NAD. She does have some subtle adenopathy in the cervical chain bilaterally more eccentric on the right. Well-developed well-nourished patient in NAD. HEENT reveals PERLA, EOMI, discs not visualized.  Oral cavity is clear. No oral  mucosal lesions are identified. Neck is clear without evidence of cervical or supraclavicular adenopathy. Lungs are clear to A&P. Cardiac examination is essentially unremarkable with regular rate and rhythm without murmur rub or thrill. Abdomen is benign with no organomegaly or masses noted. Motor sensory and DTR levels are equal and symmetric in the upper and lower extremities. Cranial nerves II through XII are grossly intact. Proprioception is intact. No peripheral adenopathy or edema is identified. No motor or sensory levels are noted. Crude visual fields are within normal range.  LABORATORY DATA: Pathology report reviewed    RADIOLOGY RESULTS: CT scan of abdomen and pelvis reviewed   IMPRESSION: Malignant lymphoma B cell type diffuse responding well to Rituxan.  PLAN: I discussed case personally with medical oncology. See no role at this point for palliative radiation therapy. Her abdominal mass has resolved nicely. She is being treated for colitis which seems to be improving with Flagyl. I will turn follow-up care over to medical oncology be happy to reevaluate patient at any time should palliative treatment be indicated. I have explained to the patient and family any focal areas of lymphomas involvement can be readily palliated with radiation.  I would like to take this opportunity for allowing me to participate in  the care of your patient.Armstead Peaks., MD

## 2015-12-17 NOTE — Progress Notes (Signed)
Patient states she is not sleeping at night.  States she is having a BM every time she eats.  Recently started on Cipro and Flagyl for UTI.  Also had a change in her eye drops for glaucoma.

## 2016-01-02 ENCOUNTER — Inpatient Hospital Stay: Payer: PPO

## 2016-01-02 ENCOUNTER — Encounter: Payer: Self-pay | Admitting: Oncology

## 2016-01-02 ENCOUNTER — Inpatient Hospital Stay (HOSPITAL_BASED_OUTPATIENT_CLINIC_OR_DEPARTMENT_OTHER): Payer: PPO | Admitting: Oncology

## 2016-01-02 VITALS — BP 75/60 | HR 106 | Temp 95.3°F | Resp 18 | Wt 117.2 lb

## 2016-01-02 DIAGNOSIS — R109 Unspecified abdominal pain: Secondary | ICD-10-CM | POA: Diagnosis not present

## 2016-01-02 DIAGNOSIS — C8330 Diffuse large B-cell lymphoma, unspecified site: Secondary | ICD-10-CM

## 2016-01-02 DIAGNOSIS — M7989 Other specified soft tissue disorders: Secondary | ICD-10-CM

## 2016-01-02 DIAGNOSIS — R5383 Other fatigue: Secondary | ICD-10-CM

## 2016-01-02 DIAGNOSIS — D72829 Elevated white blood cell count, unspecified: Secondary | ICD-10-CM

## 2016-01-02 DIAGNOSIS — R1900 Intra-abdominal and pelvic swelling, mass and lump, unspecified site: Secondary | ICD-10-CM

## 2016-01-02 DIAGNOSIS — Z7952 Long term (current) use of systemic steroids: Secondary | ICD-10-CM

## 2016-01-02 DIAGNOSIS — G4700 Insomnia, unspecified: Secondary | ICD-10-CM

## 2016-01-02 DIAGNOSIS — I959 Hypotension, unspecified: Secondary | ICD-10-CM | POA: Diagnosis not present

## 2016-01-02 DIAGNOSIS — R531 Weakness: Secondary | ICD-10-CM

## 2016-01-02 DIAGNOSIS — R630 Anorexia: Secondary | ICD-10-CM | POA: Diagnosis not present

## 2016-01-02 DIAGNOSIS — E860 Dehydration: Secondary | ICD-10-CM

## 2016-01-02 DIAGNOSIS — Z8551 Personal history of malignant neoplasm of bladder: Secondary | ICD-10-CM

## 2016-01-02 DIAGNOSIS — Z79899 Other long term (current) drug therapy: Secondary | ICD-10-CM

## 2016-01-02 LAB — CBC WITH DIFFERENTIAL/PLATELET
BASOS ABS: 0.1 10*3/uL (ref 0–0.1)
BASOS PCT: 1 %
EOS PCT: 0 %
Eosinophils Absolute: 0 10*3/uL (ref 0–0.7)
HEMATOCRIT: 41.3 % (ref 35.0–47.0)
Hemoglobin: 13.9 g/dL (ref 12.0–16.0)
LYMPHS PCT: 15 %
Lymphs Abs: 1.7 10*3/uL (ref 1.0–3.6)
MCH: 30.4 pg (ref 26.0–34.0)
MCHC: 33.6 g/dL (ref 32.0–36.0)
MCV: 90.5 fL (ref 80.0–100.0)
Monocytes Absolute: 1.3 10*3/uL — ABNORMAL HIGH (ref 0.2–0.9)
Monocytes Relative: 11 %
NEUTROS ABS: 8.5 10*3/uL — AB (ref 1.4–6.5)
Neutrophils Relative %: 73 %
PLATELETS: 230 10*3/uL (ref 150–440)
RBC: 4.57 MIL/uL (ref 3.80–5.20)
RDW: 17.1 % — AB (ref 11.5–14.5)
WBC: 11.7 10*3/uL — AB (ref 3.6–11.0)

## 2016-01-02 LAB — URINALYSIS COMPLETE WITH MICROSCOPIC (ARMC ONLY)
BACTERIA UA: NONE SEEN
BILIRUBIN URINE: NEGATIVE
Glucose, UA: NEGATIVE mg/dL
LEUKOCYTES UA: NEGATIVE
NITRITE: NEGATIVE
Protein, ur: 30 mg/dL — AB
SPECIFIC GRAVITY, URINE: 1.023 (ref 1.005–1.030)
pH: 5 (ref 5.0–8.0)

## 2016-01-02 LAB — COMPREHENSIVE METABOLIC PANEL
ALBUMIN: 3.4 g/dL — AB (ref 3.5–5.0)
ALT: 14 U/L (ref 14–54)
AST: 26 U/L (ref 15–41)
Alkaline Phosphatase: 52 U/L (ref 38–126)
Anion gap: 9 (ref 5–15)
BUN: 25 mg/dL — AB (ref 6–20)
CHLORIDE: 99 mmol/L — AB (ref 101–111)
CO2: 25 mmol/L (ref 22–32)
CREATININE: 1.35 mg/dL — AB (ref 0.44–1.00)
Calcium: 9.3 mg/dL (ref 8.9–10.3)
GFR calc Af Amer: 39 mL/min — ABNORMAL LOW (ref >60)
GFR, EST NON AFRICAN AMERICAN: 33 mL/min — AB (ref >60)
GLUCOSE: 201 mg/dL — AB (ref 65–99)
POTASSIUM: 3.9 mmol/L (ref 3.5–5.1)
Sodium: 133 mmol/L — ABNORMAL LOW (ref 135–145)
Total Bilirubin: 1.1 mg/dL (ref 0.3–1.2)
Total Protein: 6.3 g/dL — ABNORMAL LOW (ref 6.5–8.1)

## 2016-01-02 LAB — MAGNESIUM: MAGNESIUM: 2.1 mg/dL (ref 1.7–2.4)

## 2016-01-02 MED ORDER — SODIUM CHLORIDE 0.9 % IV SOLN
Freq: Once | INTRAVENOUS | Status: AC
  Administered 2016-01-02: 10:00:00 via INTRAVENOUS
  Filled 2016-01-02: qty 1000

## 2016-01-02 MED ORDER — METHYLPREDNISOLONE SODIUM SUCC 125 MG IJ SOLR
40.0000 mg | Freq: Once | INTRAMUSCULAR | Status: AC
  Administered 2016-01-02: 40 mg via INTRAVENOUS
  Filled 2016-01-02: qty 2

## 2016-01-02 NOTE — Progress Notes (Signed)
[place] @ [facility] Telephone:([phone]) [phone]  Fax:([phone]) [place] OB: 1923-07-28  MR#: [id number]  [id number]  Patient Care Team: [name], MD as PCP - General (Family Medicine) Seeplaputhur [name], MD as Consulting Physician (General Surgery) Provider Not In [place]:  Chief Complaint  Patient presents with  . B-Cell Lymphoma    VISIT DIAGNOSIS:   Diffuse be last cell lymphoma  Oncology History   37.  82 year old lady presented with large left neck mass and abdominal palpable mass.  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  Patient did not want any chemotherapy so was started on prednisone followed by Rituxan in June of 2016 with excellent response 3.  Patient has finished 4 weekly cycle of rituximab on April 16, 2015 with excellent response   4.  Because of bulky disease in the past patient is getting maintenance Rituxan therapy started in September of 2016   Oncology Flowsheet 04/03/2015 04/10/2015 04/17/2015 06/19/2015 08/19/2015 09/17/2015 11/07/2015  dexamethasone (DECADRON) IV - - - - - 20 mg -  diphenhydrAMINE (BENADRYL) PO 50 mg 50 mg 50 mg 50 mg 50 mg - 25 mg  methylPREDNISolone sodium succinate 125 mg/2 mL (SOLU-MEDROL) IV - - - - - - -  riTUXimab (RITUXAN) IV 375 mg/m2 375 mg/m2 375 mg/m2 375 mg/m2 375 mg/m2 - 600 mg    INTERVAL HISTORY:  82 year old lady with a history of diffuse large cell lymphoma increasing abdominal pain.  Recently had a CT scan and PET scan done which revealed that lymphoma was gradually improving however patient had diverticulitis and small abscess. Patient was started on Cipro and Flagyl.  Patient was also evaluated today by radiation oncologist I discussed situation with him and we decided to hold up any further radiation therapy She came today after starting Flagyl in gradually discontinuing prednisone  patient's condition is declining.  Appetite is poor.  Feeling weak and tired.  Decreased oral intake.  . As per family patient has not taken any fluid or food in the last 24 hours.  Patient is here for ongoing evaluation denies any fever.  Abdominal pain is improved.  During evaluation patient was found to be hypotensive.  Dehydrated.           REVIEW OF SYSTEMS:   Gen. status:Elderly lady feeling somewhat weak and tired and wheelchair.Lining performance status HEENT:  Dryness in the mouth.  Difficulty swallowing. GI: No abdominal pain has improved.  No nausea but poor appetite no diarrhea. Review no dysuria or hematuria Extremities no swelling Neurological system: Patient is extremely fatigued.  Alert and oriented. Musculoskeletal system denies any pain  lower extremityswelling Skin: No rash  No  Cardiac: No chest pain  lungs: No shortness of breath no cough    As per HPI. Otherwise, a complete review of systems is negatve.  PAST MEDICAL HISTORY: Patient's past medical history includes his and had lumpectomy in the past.  Bladder surgery.  Hysterectomy bilateral cataract surgery.   FAMILY HISTORY NO FAMILY HISTORY OF CARCINOMA OF BREAST OVARY OR COLON CANCER  GYNECOLOGIC HISTORY:  No LMP recorded. Patient has had a hysterectomy.  And had a hysterectomy   ADVANCED DIRECTIVES:  patien s considering advanced healthcare directive  HEALTH MAINTENANCE: Social History  Substance Use Topics  . Smoking status: Never Smoker   . Smokeless tobacco: Not on file  . Alcohol  Use: No      Allergies  Allergen Reactions  . Iron Nausea And Vomiting  . Codeine Rash    Current Outpatient Prescriptions  Medication Sig Dispense Refill  . HYDROcodone-acetaminophen (NORCO/VICODIN) 5-325 MG tablet 0.5 - 1 tab 4 times a day as needed for pain 60 tablet 0  . latanoprost (XALATAN) 0.005 % ophthalmic solution     . Calcium Carbonate-Vitamin D (CALCIUM + D PO) Reported on 01/02/2016    .  ciprofloxacin (CIPRO) 500 MG tablet Take 1 tablet (500 mg total) by mouth 2 (two) times daily. (Patient not taking: Reported on 01/02/2016) 14 tablet 0  . cyanocobalamin (,VITAMIN B-12,) 1000 MCG/ML injection INJECT 1 ML Q MONTH UTD  0  . cyanocobalamin 1000 MCG tablet Take 100 mcg by mouth every 30 (thirty) days. Reported on 01/02/2016    . gabapentin (NEURONTIN) 100 MG capsule Take 1 capsule (100 mg total) by mouth 2 (two) times daily. (Patient not taking: Reported on 01/02/2016) 60 capsule 3  . metroNIDAZOLE (FLAGYL) 250 MG tablet Take 1 tablet (250 mg total) by mouth 3 (three) times daily. (Patient not taking: Reported on 01/02/2016) 21 tablet 0  . neomycin-polymyxin b-dexamethasone (MAXITROL) 3.5-10000-0.1 SUSP Reported on 01/02/2016  0  . omeprazole (PRILOSEC) 20 MG capsule Take 1 capsule (20 mg total) by mouth 2 (two) times daily before a meal. (Patient not taking: Reported on 01/02/2016) 60 capsule 3  . polyethylene glycol powder (GLYCOLAX/MIRALAX) powder Take 1 Container by mouth once. Reported on 01/02/2016    . predniSONE (DELTASONE) 20 MG tablet Take 1 tablet by mouth every other day (Patient not taking: Reported on 01/02/2016)     No current facility-administered medications for this visit.    OBJECTIVE: Gen. status: Patient is alert oriented in the wheelchair. Declining performance status of 2 Skin: Dry Mucous membranes are dry. HEENT: No abnormality detected Lungs: Air entry equal on both sides.  GI: Abdomen is soft.  No tenderness.  Liver and spleen not palpable. Lower extremity swelling. Cardiac: The cardia  Neurological system no localizing sign. She called in ambulation.  Sensory and motor system difficult to examine.  Skeletal system within normal limits Filed Vitals:   01/02/16 0858  BP: 75/60  Pulse: 106  Temp: 95.3 F (35.2 C)  Resp: 18     Body mass index is 18.92 kg/(m^2).    Lab data has been reviewed CBC Latest Ref Rng 01/02/2016 12/05/2015 11/07/2015  WBC 3.6 -  11.0 K/uL 11.7(H) 8.5 8.4  Hemoglobin 12.0 - 16.0 g/dL 13.9 12.9 13.2  Hematocrit 35.0 - 47.0 % 41.3 38.1 39.9  Platelets 150 - 440 K/uL 230 165 127(L)                                                                                 STUDIES: Ct Abdomen Pelvis W Contrast  12/13/2015  CLINICAL DATA:  Diffuse large B-cell lymphoma. EXAM: CT ABDOMEN AND PELVIS WITH CONTRAST TECHNIQUE: Multidetector CT imaging of the abdomen and pelvis was performed using the standard protocol following bolus administration of intravenous contrast. CONTRAST:  19mL OMNIPAQUE IOHEXOL 300 MG/ML  SOLN COMPARISON:  09/12/2015 FINDINGS: Lower chest: Focal area of interstitial and airspace  disease is identified in the anterior right lower lobe, new in the interval. Hepatobiliary: 5 mm low-density lesion in medial segment left liver is stable. Liver parenchyma otherwise unremarkable. There is no evidence for gallstones, gallbladder wall thickening, or pericholecystic fluid. Common bile duct measures 9 mm in diameter, not substantially changed in the interval. Pancreas: There is diffuse distention of the main pancreatic duct, as before with stable appearance. No discrete or focal pancreatic mass lesion is evident. Spleen: No splenomegaly. No focal mass lesion. Adrenals/Urinary Tract: No adrenal nodule or mass. No enhancing lesion in either kidney. Stable tiny hypo enhancing cortical lesions bilaterally, likely representing cysts. No evidence for hydroureter. The urinary bladder appears normal for the degree of distention. Stomach/Bowel: Stomach is nondistended. No gastric wall thickening. No evidence of outlet obstruction. Duodenum is normally positioned as is the ligament of Treitz. No small bowel wall thickening. No small bowel dilatation. The terminal ileum is normal. The appendix is not visualized, but there is no edema or inflammation in the region of the cecum. Diverticular changes are noted in the left  colon. In the proximal to mid sigmoid colon, there is an area of very subtle pericolonic edema/inflammation (see image 60 series 2) with associated rim enhancing collection in the colonic wall (well seen on coronal image 63 of series 6). These imaging features are suspicious for diverticulitis, potentially with any small intramural abscess. There is no evidence for extraluminal gas. No gross edema or fluid in the root of the sigmoid mesocolon. Vascular/Lymphatic: The 5.0 x 5.7 cm central abdominal mass lesions seen on the previous study has almost completely resolved. On today's exam only a small amount of soft tissue is seen in the central mesentery (image 20 series 2). This circumferentially encases the SMA and the soft tissue lesion now measures 1.5 x 2.2 cm. A small 8 x 9 mm focus of soft tissue attenuation is seen to the left of the abdominal aorta, at the location of the previous retroperitoneal lymphadenopathy. There is abdominal aortic atherosclerosis without aneurysm. No pelvic sidewall lymphadenopathy. Reproductive: Uterus is surgically absent. There is no adnexal mass. Other: No intraperitoneal free fluid. Musculoskeletal: Bone windows reveal no worrisome lytic or sclerotic osseous lesions. IMPRESSION: 1. Interval development of subtle pericolonic edema/inflammation in the proximal to mid sigmoid colon with a small 1 x 2 cm rim enhancing structure in the wall the colon that could be a tiny intramural abscess. There is no evidence for perforation of the colon in this region. There is no pericolonic abscess in the sigmoid mesocolon that would require drainage. 2. Marked interval response to therapy with the 6 cm mass seen in the central abdomen on the previous study now measuring 15 x 22 mm. Retroperitoneal involvement has also almost completely resolved. 3. Small area of focal airspace disease in the anterior right lower lobe. Imaging features are suspicious for pneumonia. I discussed these findings by  telephone with Dr. Oliva Bustard at approximately 1016 hours on 12/13/2015. Electronically Signed   By: Misty Stanley M.D.   On: 12/13/2015 10:18    ASSESSMENT:  Patient had extensive lymphadenopathy with left supraclavicular area as well as abdominal mass Biopsies positive for diffuse B large cell lymphoma,  stage III Previous history of carcinoma of bladder, .  All lab data  has been reviewed  Patient has significant clinical improvement with rituximab therapy Because of bulky disease would like  to go with maintenance Rituxan therapy  PLAN:   Diffuse B large cell lymphoma  Patient's condition  is declining.  Lab data shows rising BUN and creatinine suggest dehydration. Leukocytosis may be dehydration or underlying infection  Hypotensive and may be due to dehydration versus infection  Patient is not a good candidate to continue rituximab at present time  Patient and family does not want her to be in the hospital  We will get 2 sets of blood cultures and urine analysis and urine culture if needed  IV fluid.  We will just go back on IV steroids.  Family's desire to keep patient at home and comfortable.  Depending on the blood culture report if needed further antibiotics can be given.  If urine analysis is abnormal and urine culture would be done.  I will follow up blood culture tomorrow 7.  She will would be rechecked in infusion center after IV fluid is given patient will start 1000 cc of IV fluid will be repeated if needed IV steroids will be given patient was started back on 20  mg of prednisone.  4 improving quality of life and appetite    Forest Gleason, MD   01/02/2016 9:26 AM

## 2016-01-02 NOTE — Progress Notes (Signed)
Patient is not eating much at all .  Just a few bites of jello and a few sips of juice.  Patient finished antibiotics earlier this week.  Very fatigued.  BP 85/60 HR 106.  Patient has white coated tongue from antibiotics.  Patient has edema in right leg and ankle, tender to touch.  Also c/o lower abdominal pain.

## 2016-01-06 ENCOUNTER — Ambulatory Visit
Admission: RE | Admit: 2016-01-06 | Discharge: 2016-01-06 | Disposition: A | Discharge status: Home / Self Care | Payer: PPO | Source: Ambulatory Visit | Attending: Family Medicine | Admitting: Family Medicine

## 2016-01-06 ENCOUNTER — Inpatient Hospital Stay (HOSPITAL_BASED_OUTPATIENT_CLINIC_OR_DEPARTMENT_OTHER): Payer: PPO | Admitting: Family Medicine

## 2016-01-06 ENCOUNTER — Inpatient Hospital Stay: Payer: PPO

## 2016-01-06 ENCOUNTER — Telehealth: Payer: Self-pay | Admitting: *Deleted

## 2016-01-06 VITALS — BP 110/68 | HR 87 | Temp 95.9°F | Resp 18

## 2016-01-06 DIAGNOSIS — E860 Dehydration: Secondary | ICD-10-CM

## 2016-01-06 DIAGNOSIS — C8330 Diffuse large B-cell lymphoma, unspecified site: Secondary | ICD-10-CM

## 2016-01-06 DIAGNOSIS — R1900 Intra-abdominal and pelvic swelling, mass and lump, unspecified site: Secondary | ICD-10-CM

## 2016-01-06 DIAGNOSIS — G4700 Insomnia, unspecified: Secondary | ICD-10-CM

## 2016-01-06 DIAGNOSIS — C8339 Diffuse large B-cell lymphoma, extranodal and solid organ sites: Secondary | ICD-10-CM | POA: Diagnosis not present

## 2016-01-06 DIAGNOSIS — R634 Abnormal weight loss: Secondary | ICD-10-CM

## 2016-01-06 DIAGNOSIS — I82411 Acute embolism and thrombosis of right femoral vein: Secondary | ICD-10-CM | POA: Insufficient documentation

## 2016-01-06 DIAGNOSIS — Z79899 Other long term (current) drug therapy: Secondary | ICD-10-CM

## 2016-01-06 DIAGNOSIS — R5383 Other fatigue: Secondary | ICD-10-CM

## 2016-01-06 DIAGNOSIS — Z7952 Long term (current) use of systemic steroids: Secondary | ICD-10-CM

## 2016-01-06 DIAGNOSIS — M25511 Pain in right shoulder: Secondary | ICD-10-CM

## 2016-01-06 DIAGNOSIS — R630 Anorexia: Secondary | ICD-10-CM

## 2016-01-06 DIAGNOSIS — R5381 Other malaise: Secondary | ICD-10-CM

## 2016-01-06 DIAGNOSIS — I82431 Acute embolism and thrombosis of right popliteal vein: Secondary | ICD-10-CM | POA: Insufficient documentation

## 2016-01-06 DIAGNOSIS — M549 Dorsalgia, unspecified: Secondary | ICD-10-CM

## 2016-01-06 DIAGNOSIS — R109 Unspecified abdominal pain: Secondary | ICD-10-CM

## 2016-01-06 DIAGNOSIS — R531 Weakness: Secondary | ICD-10-CM

## 2016-01-06 DIAGNOSIS — M7989 Other specified soft tissue disorders: Secondary | ICD-10-CM

## 2016-01-06 DIAGNOSIS — D72829 Elevated white blood cell count, unspecified: Secondary | ICD-10-CM

## 2016-01-06 DIAGNOSIS — I959 Hypotension, unspecified: Secondary | ICD-10-CM

## 2016-01-06 DIAGNOSIS — Z8551 Personal history of malignant neoplasm of bladder: Secondary | ICD-10-CM

## 2016-01-06 DIAGNOSIS — R110 Nausea: Secondary | ICD-10-CM

## 2016-01-06 LAB — COMPREHENSIVE METABOLIC PANEL
ALBUMIN: 2.9 g/dL — AB (ref 3.5–5.0)
ALT: 15 U/L (ref 14–54)
ANION GAP: 5 (ref 5–15)
AST: 19 U/L (ref 15–41)
Alkaline Phosphatase: 53 U/L (ref 38–126)
BUN: 26 mg/dL — ABNORMAL HIGH (ref 6–20)
CO2: 28 mmol/L (ref 22–32)
Calcium: 9.1 mg/dL (ref 8.9–10.3)
Chloride: 100 mmol/L — ABNORMAL LOW (ref 101–111)
Creatinine, Ser: 1.01 mg/dL — ABNORMAL HIGH (ref 0.44–1.00)
GFR calc Af Amer: 55 mL/min — ABNORMAL LOW (ref >60)
GFR calc non Af Amer: 48 mL/min — ABNORMAL LOW (ref >60)
GLUCOSE: 101 mg/dL — AB (ref 65–99)
POTASSIUM: 4.8 mmol/L (ref 3.5–5.1)
SODIUM: 133 mmol/L — AB (ref 135–145)
Total Bilirubin: 0.9 mg/dL (ref 0.3–1.2)
Total Protein: 5.9 g/dL — ABNORMAL LOW (ref 6.5–8.1)

## 2016-01-06 LAB — CBC WITH DIFFERENTIAL/PLATELET
Basophils Absolute: 0 10*3/uL (ref 0–0.1)
Basophils Relative: 0 %
EOS ABS: 0.1 10*3/uL (ref 0–0.7)
EOS PCT: 1 %
HCT: 33.9 % — ABNORMAL LOW (ref 35.0–47.0)
HEMOGLOBIN: 11.4 g/dL — AB (ref 12.0–16.0)
LYMPHS ABS: 0.5 10*3/uL — AB (ref 1.0–3.6)
Lymphocytes Relative: 5 %
MCH: 30.3 pg (ref 26.0–34.0)
MCHC: 33.6 g/dL (ref 32.0–36.0)
MCV: 90 fL (ref 80.0–100.0)
MONO ABS: 0.8 10*3/uL (ref 0.2–0.9)
MONOS PCT: 8 %
Neutro Abs: 8 10*3/uL — ABNORMAL HIGH (ref 1.4–6.5)
Neutrophils Relative %: 86 %
PLATELETS: 210 10*3/uL (ref 150–440)
RBC: 3.77 MIL/uL — ABNORMAL LOW (ref 3.80–5.20)
RDW: 16.7 % — AB (ref 11.5–14.5)
WBC: 9.3 10*3/uL (ref 3.6–11.0)

## 2016-01-06 LAB — MAGNESIUM: Magnesium: 2.1 mg/dL (ref 1.7–2.4)

## 2016-01-06 MED ORDER — FENTANYL 12 MCG/HR TD PT72: 12.0000 ug | MEDICATED_PATCH | TRANSDERMAL | Status: DC

## 2016-01-06 MED ORDER — SODIUM CHLORIDE 0.9 % IV SOLN
Freq: Once | INTRAVENOUS | Status: AC
  Administered 2016-01-06: 13:00:00 via INTRAVENOUS
  Filled 2016-01-06: qty 1000

## 2016-01-06 MED ORDER — SODIUM CHLORIDE 0.9 % IV SOLN
10.0000 mg | Freq: Once | INTRAVENOUS | Status: AC
  Administered 2016-01-06: 10 mg via INTRAVENOUS
  Filled 2016-01-06: qty 1

## 2016-01-06 NOTE — Telephone Encounter (Signed)
Has right shoulder pain since yesterday not swollen and no injury. Not eating or drinking much at all. Very weak. Has an appt for IVF Friday, but feels she needs to be seen before then

## 2016-01-06 NOTE — Progress Notes (Signed)
Lisa Sosa  [phone]  [phone]     Lisa Sosa DOB: Dec 12, 1922  MR#: [id number]  YQM#:[id number]  Patient Care Team: [name], MD as PCP - General (Family Medicine) Christene Lye, MD as Consulting Physician (General Surgery) Provider Not In [place]:  Chief Complaint  Patient presents with  . Diffuse Large B Cell Lymphoma    INTERVAL HISTORY:  Patient is here as an acute add on regarding increasing lower abdominal pain, weakness, nausea, poor oral intake, and right lower extremity edema. Patient was previously receiving Rituxan and for diffuse large cell lymphoma. She was recently treated with Cipro and Flagyl for possible diverticulitis versus abscess of the colon. Patient reports having new onset of right shoulder pain, back pain, and lower abdominal pain over the weekend. She has passed gas which is improved lower abdominal pain but only a small bowel movement.  REVIEW OF SYSTEMS:   Review of Systems  Constitutional: Positive for weight loss and malaise/fatigue. Negative for fever, chills and diaphoresis.  HENT: Negative.   Eyes: Negative.   Respiratory: Negative for cough, hemoptysis, sputum production, shortness of breath and wheezing.   Cardiovascular: Positive for leg swelling. Negative for chest pain, palpitations, orthopnea, claudication and PND.       RLE  Gastrointestinal: Positive for nausea and abdominal pain. Negative for heartburn, vomiting, diarrhea, constipation, blood in stool and melena.  Genitourinary: Negative.   Musculoskeletal: Positive for back pain and joint pain.  Skin: Negative.   Neurological: Positive for weakness. Negative for dizziness, tingling, focal weakness and seizures.  Endo/Heme/Allergies: Does not bruise/bleed easily.  Psychiatric/Behavioral: Negative for depression. The patient is not nervous/anxious and does not have insomnia.     As per HPI. Otherwise, a complete  review of systems is negatve.  ONCOLOGY HISTORY: Oncology History   24.  82 year old lady presenteded with large left neck mass and abdominal palpable mass.  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  Patient did not want any chemotherapy so was started on prednisone followed by Rituxan in June of 2016 with excellent response 3.  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 Chickenpox   . Glaucoma   . Elevated blood pressure   . History of blood transfusion   . Cancer [facility]) 2011    gallbladder  . Diffuse large B cell lymphoma ([place]) 03/17/2015    PAST SURGICAL HISTORY: Past Surgical History  Procedure Laterality Date  . Abdominal hysterectomy  1978    For fibroids  . Breast biopsy Right 1990  . Transurethral resection of bladder tumor  2011     FAMILY HISTORY Family History  Problem Relation Age of Onset  . Alcoholism    . Cancer - Lung      Parent  . Cancer - Ovarian Mother   . Prostate cancer      Other relative  . Stroke      Other relative   . High blood pressure      Other relative  .  Diabetes      Other relative    GYNECOLOGIC HISTORY:  No LMP recorded. Patient has had a hysterectomy.     ADVANCED DIRECTIVES:    HEALTH MAINTENANCE: Social History  Substance Use Topics  . Smoking status: Never Smoker   . Smokeless tobacco: Not on file  . Alcohol Use: No      Allergies  Allergen Reactions  . Iron Nausea And Vomiting  . Codeine Rash    Current Outpatient Prescriptions  Medication Sig Dispense Refill  . Calcium Carbonate-Vitamin D  (CALCIUM + D PO) Reported on 01/02/2016    . ciprofloxacin (CIPRO) 500 MG tablet Take 1 tablet (500 mg total) by mouth 2 (two) times daily. 14 tablet 0  . cyanocobalamin (,VITAMIN B-12,) 1000 MCG/ML injection INJECT 1 ML Q MONTH UTD  0  . cyanocobalamin 1000 MCG tablet Take 100 mcg by mouth every 30 (thirty) days. Reported on 01/02/2016    . gabapentin (NEURONTIN) 100 MG capsule Take 1 capsule (100 mg total) by mouth 2 (two) times daily. 60 capsule 3  . HYDROcodone-acetaminophen (NORCO/VICODIN) 5-325 MG tablet 0.5 - 1 tab 4 times a day as needed for pain 60 tablet 0  . latanoprost (XALATAN) 0.005 % ophthalmic solution     . metroNIDAZOLE (FLAGYL) 250 MG tablet Take 1 tablet (250 mg total) by mouth 3 (three) times daily. 21 tablet 0  . neomycin-polymyxin b-dexamethasone (MAXITROL) 3.5-10000-0.1 SUSP Reported on 01/02/2016  0  . omeprazole (PRILOSEC) 20 MG capsule Take 1 capsule (20 mg total) by mouth 2 (two) times daily before a meal. 60 capsule 3  . polyethylene glycol powder (GLYCOLAX/MIRALAX) powder Take 1 Container by mouth once. Reported on 01/02/2016    . predniSONE (DELTASONE) 20 MG tablet Take 1 tablet by mouth every other day    . fentaNYL (DURAGESIC - DOSED MCG/HR) 12 MCG/HR Place 1 patch (12.5 mcg total) onto the skin every 3 (three) days. 10 patch 0   No current facility-administered medications for this visit.    OBJECTIVE: BP 110/68 mmHg  Pulse 87  Temp(Src) 95.9 F (35.5 C) (Tympanic)  Resp 18  Wt    There is no weight on file to calculate BMI.    ECOG FS:3 - Symptomatic, >50% confined to bed  General: Well-developed, well-nourished, no acute distress. Eyes: Pink conjunctiva, anicteric sclera. HEENT: Normocephalic, moist mucous membranes, clear oropharnyx. Lungs: Clear to auscultation bilaterally. Heart: Regular rate and rhythm. No rubs, murmurs, or gallops. Abdomen: Soft, nontender, nondistended.  Musculoskeletal: Right lower extremity edema, 1+, cyanosis, or  clubbing. Neuro: Alert, answering all questions appropriately. Cranial nerves grossly intact. Skin: No rashes or petechiae noted. Psych: Normal affect.   LAB RESULTS:  Appointment on 01/06/2016  Component Date Value Ref Range Status  . WBC 01/06/2016 9.3  3.6 - 11.0 K/uL Final  . RBC 01/06/2016 3.77* 3.80 - 5.20 MIL/uL Final  . Hemoglobin 01/06/2016 11.4* 12.0 - 16.0 g/dL Final  . HCT 01/06/2016 33.9* 35.0 - 47.0 % Final  . MCV 01/06/2016 90.0  80.0 - 100.0 fL Final  . MCH 01/06/2016 30.3  26.0 - 34.0 pg Final  . MCHC 01/06/2016 33.6  32.0 - 36.0 g/dL Final  . RDW 01/06/2016 16.7* 11.5 - 14.5 % Final  . Platelets 01/06/2016 210  150 - 440 K/uL Final  . Neutrophils Relative % 01/06/2016 86   Final  . Neutro Abs 01/06/2016 8.0* 1.4 - 6.5 K/uL Final  . Lymphocytes Relative 01/06/2016 5   Final  . Lymphs  Abs 01/06/2016 0.5* 1.0 - 3.6 K/uL Final  . Monocytes Relative 01/06/2016 8   Final  . Monocytes Absolute 01/06/2016 0.8  0.2 - 0.9 K/uL Final  . Eosinophils Relative 01/06/2016 1   Final  . Eosinophils Absolute 01/06/2016 0.1  0 - 0.7 K/uL Final  . Basophils Relative 01/06/2016 0   Final  . Basophils Absolute 01/06/2016 0.0  0 - 0.1 K/uL Final  . Sodium 01/06/2016 133* 135 - 145 mmol/L Final  . Potassium 01/06/2016 4.8  3.5 - 5.1 mmol/L Final  . Chloride 01/06/2016 100* 101 - 111 mmol/L Final  . CO2 01/06/2016 28  22 - 32 mmol/L Final  . Glucose, Bld 01/06/2016 101* 65 - 99 mg/dL Final  . BUN 01/06/2016 26* 6 - 20 mg/dL Final  . Creatinine, Ser 01/06/2016 1.01* 0.44 - 1.00 mg/dL Final  . Calcium 01/06/2016 9.1  8.9 - 10.3 mg/dL Final  . Total Protein 01/06/2016 5.9* 6.5 - 8.1 g/dL Final  . Albumin 01/06/2016 2.9* 3.5 - 5.0 g/dL Final  . AST 01/06/2016 19  15 - 41 U/L Final  . ALT 01/06/2016 15  14 - 54 U/L Final  . Alkaline Phosphatase 01/06/2016 53  38 - 126 U/L Final  . Total Bilirubin 01/06/2016 0.9  0.3 - 1.2 mg/dL Final  . GFR calc non Af Amer 01/06/2016 48* >60 mL/min  Final  . GFR calc Af Amer 01/06/2016 55* >60 mL/min Final   Comment: (NOTE) The eGFR has been calculated using the CKD EPI equation. This calculation has not been validated in all clinical situations. eGFR's persistently <60 mL/min signify possible Chronic Kidney Disease.   . Anion gap 01/06/2016 5  5 - 15 Final  . Magnesium 01/06/2016 2.1  1.7 - 2.4 mg/dL Final    STUDIES: No results found.  ASSESSMENT:  Diffuse large B-cell lymphoma. Right lower extremity edema. Dehydration. Right shoulder and back pain.  PLAN:   1. Lymphoma. Patient's overall condition has been declining. Patient was recently started on steroids due to declining condition as well as started on oral antibiotics over concern for abscess versus progressive lymphoma. Considering episode of worsening abdominal pain over the weekend we will obtain a CT scan of the chest and abdomen. Will await results and inform patient. Dr. Oliva Bustard has previously noted that continuation of treatment not currently feasible. We will also start fentanyl 12 g patches every 72 hours. 2. Right lower extremity edema. We will obtain ultrasound of right lower extremity to rule out DVT. 3. Dehydration. Patient has been unable to eat or drink anything consistent amounts for several days. She is currently on 20 mg of prednisone daily for stimulant of appetite and energy. Patient advised to continue with prednisone and we will [facility] to come and evaluate her. We will hydrate today with IV fluids as well as Decadron. 4. Right shoulder and back pain. Patient is being started on fentanyl 12 g patches every 72 hours. Patient also has as needed Vicodin at home to use for pain. Will await results of CT scan of chest and abdomen.  Patient expressed understanding and was in agreement with this plan. She also understands that She can call clinic at any time with any questions, concerns, or complaints.   Dr. Oliva Bustard was available for  consultation and review of plan of care for this patient.   Evlyn Kanner, NP   01/06/2016 3:54 PM

## 2016-01-06 NOTE — Progress Notes (Signed)
Patient acute add on today for lab, see NP and possible IVF.  Patient complains of right shoulder pain.  States it was 10/10 but she took pain medication prior to coming for appt.  Also has right lower extremity edema.  No appetite.

## 2016-01-06 NOTE — Telephone Encounter (Signed)
Per Dr Oliva Bustard come in and see Magda Paganini and get CBC, CMP, Mg+ Per Magda Paganini, schedule chair for IVF. Delcie Roch agrees to bring pt in within 30 - 45 mins

## 2016-01-07 LAB — CULTURE, BLOOD (ROUTINE X 2)
CULTURE: NO GROWTH
Culture: NO GROWTH

## 2016-01-08 ENCOUNTER — Ambulatory Visit
Admission: RE | Admit: 2016-01-08 | Discharge: 2016-01-08 | Disposition: A | Discharge status: Home / Self Care | Payer: PPO | Source: Ambulatory Visit | Attending: Family Medicine | Admitting: Family Medicine

## 2016-01-08 ENCOUNTER — Other Ambulatory Visit: Payer: Self-pay | Admitting: Family Medicine

## 2016-01-08 ENCOUNTER — Telehealth: Payer: Self-pay | Admitting: Family Medicine

## 2016-01-08 DIAGNOSIS — R109 Unspecified abdominal pain: Secondary | ICD-10-CM | POA: Diagnosis not present

## 2016-01-08 DIAGNOSIS — J90 Pleural effusion, not elsewhere classified: Secondary | ICD-10-CM | POA: Insufficient documentation

## 2016-01-08 DIAGNOSIS — C8339 Diffuse large B-cell lymphoma, extranodal and solid organ sites: Secondary | ICD-10-CM | POA: Insufficient documentation

## 2016-01-08 DIAGNOSIS — I2699 Other pulmonary embolism without acute cor pulmonale: Secondary | ICD-10-CM | POA: Insufficient documentation

## 2016-01-08 MED ORDER — IOPAMIDOL (ISOVUE-300) INJECTION 61%
100.0000 mL | Freq: Once | INTRAVENOUS | Status: AC | PRN
  Administered 2016-01-08: 100 mL via INTRAVENOUS

## 2016-01-08 MED ORDER — IOPAMIDOL (ISOVUE-300) INJECTION 61%: 100.0000 mL | Freq: Once | INTRAVENOUS | Status: DC | PRN

## 2016-01-08 NOTE — Telephone Encounter (Signed)
See phone note

## 2016-01-09 ENCOUNTER — Ambulatory Visit: Payer: PPO

## 2016-01-09 DIAGNOSIS — H401123 Primary open-angle glaucoma, left eye, severe stage: Secondary | ICD-10-CM | POA: Diagnosis not present

## 2016-01-09 NOTE — Progress Notes (Signed)
Contacted by Radiology for results of [place] of right LE on 01/07/16 as well as CT of chest on 01/08/16. RLE with nonocclusive DVT, Xarelto starter pack was given as sample to patient prior to leaving hospital. Instructions given on proper use. CT of chest from 01/08/16 with right relatively large volume pulmonary emboli, was started on Xarelto on 01/07/16, she essentially asymptomatic.

## 2016-01-10 ENCOUNTER — Inpatient Hospital Stay: Payer: PPO

## 2016-01-27 DIAGNOSIS — H401112 Primary open-angle glaucoma, right eye, moderate stage: Secondary | ICD-10-CM | POA: Diagnosis not present

## 2016-01-30 ENCOUNTER — Encounter: Payer: Self-pay | Admitting: Oncology

## 2016-01-30 ENCOUNTER — Inpatient Hospital Stay: Payer: PPO | Attending: Oncology

## 2016-01-30 ENCOUNTER — Inpatient Hospital Stay (HOSPITAL_BASED_OUTPATIENT_CLINIC_OR_DEPARTMENT_OTHER): Payer: PPO | Admitting: Oncology

## 2016-01-30 ENCOUNTER — Inpatient Hospital Stay: Payer: PPO

## 2016-01-30 VITALS — BP 146/76 | HR 79 | Temp 96.5°F | Resp 18 | Wt 115.5 lb

## 2016-01-30 VITALS — BP 116/73 | HR 65 | Resp 20

## 2016-01-30 DIAGNOSIS — I82431 Acute embolism and thrombosis of right popliteal vein: Secondary | ICD-10-CM | POA: Diagnosis not present

## 2016-01-30 DIAGNOSIS — M7989 Other specified soft tissue disorders: Secondary | ICD-10-CM

## 2016-01-30 DIAGNOSIS — J90 Pleural effusion, not elsewhere classified: Secondary | ICD-10-CM

## 2016-01-30 DIAGNOSIS — I82441 Acute embolism and thrombosis of right tibial vein: Secondary | ICD-10-CM | POA: Diagnosis not present

## 2016-01-30 DIAGNOSIS — C8331 Diffuse large B-cell lymphoma, lymph nodes of head, face, and neck: Secondary | ICD-10-CM

## 2016-01-30 DIAGNOSIS — C8339 Diffuse large B-cell lymphoma, extranodal and solid organ sites: Secondary | ICD-10-CM

## 2016-01-30 DIAGNOSIS — I2699 Other pulmonary embolism without acute cor pulmonale: Secondary | ICD-10-CM | POA: Diagnosis not present

## 2016-01-30 DIAGNOSIS — Z7901 Long term (current) use of anticoagulants: Secondary | ICD-10-CM | POA: Diagnosis not present

## 2016-01-30 DIAGNOSIS — Z79899 Other long term (current) drug therapy: Secondary | ICD-10-CM

## 2016-01-30 DIAGNOSIS — Z5112 Encounter for antineoplastic immunotherapy: Secondary | ICD-10-CM | POA: Insufficient documentation

## 2016-01-30 DIAGNOSIS — C8330 Diffuse large B-cell lymphoma, unspecified site: Secondary | ICD-10-CM

## 2016-01-30 DIAGNOSIS — C83398 Diffuse large b-cell lymphoma of other extranodal and solid organ sites: Secondary | ICD-10-CM

## 2016-01-30 LAB — CBC WITH DIFFERENTIAL/PLATELET
Basophils Absolute: 0.1 10*3/uL (ref 0–0.1)
Basophils Relative: 1 %
EOS ABS: 0.1 10*3/uL (ref 0–0.7)
EOS PCT: 1 %
HCT: 35.2 % (ref 35.0–47.0)
HEMOGLOBIN: 11.7 g/dL — AB (ref 12.0–16.0)
LYMPHS ABS: 1.2 10*3/uL (ref 1.0–3.6)
LYMPHS PCT: 13 %
MCH: 30.2 pg (ref 26.0–34.0)
MCHC: 33.2 g/dL (ref 32.0–36.0)
MCV: 90.8 fL (ref 80.0–100.0)
MONOS PCT: 6 %
Monocytes Absolute: 0.6 10*3/uL (ref 0.2–0.9)
NEUTROS PCT: 79 %
Neutro Abs: 7 10*3/uL — ABNORMAL HIGH (ref 1.4–6.5)
Platelets: 215 10*3/uL (ref 150–440)
RBC: 3.88 MIL/uL (ref 3.80–5.20)
RDW: 16.3 % — ABNORMAL HIGH (ref 11.5–14.5)
WBC: 8.9 10*3/uL (ref 3.6–11.0)

## 2016-01-30 LAB — COMPREHENSIVE METABOLIC PANEL
ALK PHOS: 49 U/L (ref 38–126)
ALT: 22 U/L (ref 14–54)
ANION GAP: 6 (ref 5–15)
AST: 22 U/L (ref 15–41)
Albumin: 3.5 g/dL (ref 3.5–5.0)
BUN: 32 mg/dL — ABNORMAL HIGH (ref 6–20)
CALCIUM: 9.4 mg/dL (ref 8.9–10.3)
CO2: 30 mmol/L (ref 22–32)
CREATININE: 1.2 mg/dL — AB (ref 0.44–1.00)
Chloride: 103 mmol/L (ref 101–111)
GFR, EST AFRICAN AMERICAN: 45 mL/min — AB (ref >60)
GFR, EST NON AFRICAN AMERICAN: 39 mL/min — AB (ref >60)
Glucose, Bld: 103 mg/dL — ABNORMAL HIGH (ref 65–99)
Potassium: 4.4 mmol/L (ref 3.5–5.1)
Sodium: 139 mmol/L (ref 135–145)
TOTAL PROTEIN: 6 g/dL — AB (ref 6.5–8.1)
Total Bilirubin: 0.7 mg/dL (ref 0.3–1.2)

## 2016-01-30 LAB — MAGNESIUM: MAGNESIUM: 2.2 mg/dL (ref 1.7–2.4)

## 2016-01-30 MED ORDER — HEPARIN SOD (PORK) LOCK FLUSH 100 UNIT/ML IV SOLN: 500.0000 [IU] | Freq: Once | INTRAVENOUS | Status: DC | PRN

## 2016-01-30 MED ORDER — DIPHENHYDRAMINE HCL 25 MG PO CAPS
50.0000 mg | ORAL_CAPSULE | Freq: Once | ORAL | Status: AC
  Administered 2016-01-30: 50 mg via ORAL
  Filled 2016-01-30: qty 2

## 2016-01-30 MED ORDER — RITUXIMAB CHEMO INJECTION 500 MG/50ML: 375.0000 mg/m2 | Freq: Once | INTRAVENOUS | Status: DC

## 2016-01-30 MED ORDER — ACETAMINOPHEN 325 MG PO TABS
650.0000 mg | ORAL_TABLET | Freq: Once | ORAL | Status: AC
  Administered 2016-01-30: 650 mg via ORAL
  Filled 2016-01-30: qty 2

## 2016-01-30 MED ORDER — SODIUM CHLORIDE 0.9 % IV SOLN
Freq: Once | INTRAVENOUS | Status: AC
  Administered 2016-01-30: 11:00:00 via INTRAVENOUS
  Filled 2016-01-30: qty 1000

## 2016-01-30 MED ORDER — RITUXIMAB CHEMO INJECTION 500 MG/50ML
375.0000 mg/m2 | Freq: Once | INTRAVENOUS | Status: AC
  Administered 2016-01-30: 600 mg via INTRAVENOUS
  Filled 2016-01-30: qty 10

## 2016-01-30 MED ORDER — RIVAROXABAN 20 MG PO TABS: 20.0000 mg | ORAL_TABLET | Freq: Every day | ORAL | Status: DC

## 2016-02-01 ENCOUNTER — Encounter: Payer: Self-pay | Admitting: Oncology

## 2016-02-01 NOTE — Progress Notes (Signed)
[place] @ [facility] [phone]  Fax:([phone]) [place] OB: [birth year]  MR#: [id number]  [id number]  Patient Care Team: [name], MD as PCP - General (Family Medicine) Seeplaputhur [name], MD as Consulting Physician (General Surgery) Provider Not In [place]:  No chief complaint on file.   VISIT DIAGNOSIS:   Diffuse be last cell lymphoma  Oncology History   43.  82 year old lady presented with large left neck mass and abdominal palpable mass.  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  Patient did not want any chemotherapy so was started on prednisone followed by Rituxan in June of 2016 with excellent response 3.  Patient has finished 4 weekly cycle of rituximab on April 16, 2015 with excellent response   4.  Because of bulky disease in the past patient is getting maintenance Rituxan therapy started in September of 2016. 5. was found to have a deep vein thrombosis on xeralto.  (March, 2017) And right-sided pulmonary emboli   Oncology Flowsheet 06/19/2015 08/19/2015 09/17/2015 11/07/2015 01/02/2016 01/06/2016 01/30/2016  dexamethasone (DECADRON) IV - - 20 mg - - 10 mg -  diphenhydrAMINE (BENADRYL) PO 50 mg 50 mg - 25 mg - - 50 mg  methylPREDNISolone sodium succinate 125 mg/2 mL (SOLU-MEDROL) IV - - - - 40 mg - -  riTUXimab (RITUXAN) IV 375 mg/m2 375 mg/m2 - 600 mg - - 375 mg/m2    INTERVAL HISTORY:  82 year old lady with a history of diffuse large cell lymphoma increasing abdominal pain.  Recently had a CT scan and PET scan done which revealed that lymphoma was gradually improving however patient had diverticulitis and small abscess.  Patient is here for ongoing evaluation and treatment consideration. Abdominal pain has improved.  Patient was found to have deep vein thrombosis and was started on xeralto. Significant improvement in  symptoms appetite is improving or nausea no vomiting no diarrhea.          cT scan of abdomen and pelvis have been reviewed independently REVIEW OF SYSTEMS:   Gen. status: General status has improved.  Abdominal pain is improved.  Appetite is improving.  Marland Kitchen HEENT: No headache.  No difficulty in swallowing.  t.. GI: No abdominal pain has improved.  No nausea but poor appetite no diarrhea. Review no dysuria or hematuria Extremities no swelling Neurological system: Patient is extremely fatigued.  Alert and oriented. Musculoskeletal system denies any pain  lower extremityswelling Skin: No rash  No  Cardiac: No chest pain  lungs: No shortness of breath no cough Lower extremity swelling is improving   As per HPI. Otherwise, a complete review of systems is negatve.  PAST MEDICAL HISTORY: Patient's past medical history includes his and had lumpectomy in the past.  Bladder surgery.  Hysterectomy bilateral cataract surgery.   FAMILY HISTORY NO FAMILY HISTORY OF CARCINOMA OF BREAST OVARY OR COLON CANCER  GYNECOLOGIC HISTORY:  No LMP recorded. Patient has had a hysterectomy.  And had a hysterectomy   ADVANCED DIRECTIVES:  patien s considering advanced healthcare directive  HEALTH MAINTENANCE: Social History  Substance Use Topics  . Smoking status: Never Smoker   . Smokeless tobacco: None  . Alcohol Use: No      Allergies  Allergen Reactions  . Iron Nausea And Vomiting  . Codeine Rash    Current Outpatient Prescriptions  Medication Sig Dispense Refill  .  Calcium Carbonate-Vitamin D (CALCIUM + D PO) Reported on 01/02/2016    . cyanocobalamin (,VITAMIN B-12,) 1000 MCG/ML injection INJECT 1 ML Q MONTH UTD  0  . cyanocobalamin 1000 MCG tablet Take 100 mcg by mouth every 30 (thirty) days. Reported on 01/02/2016    . fentaNYL (DURAGESIC - DOSED MCG/HR) 12 MCG/HR Place 1 patch (12.5 mcg total) onto the skin every 3 (three) days. 10 patch 0  . gabapentin (NEURONTIN) 100 MG capsule  Take 1 capsule (100 mg total) by mouth 2 (two) times daily. 60 capsule 3  . HYDROcodone-acetaminophen (NORCO/VICODIN) 5-325 MG tablet 0.5 - 1 tab 4 times a day as needed for pain 60 tablet 0  . latanoprost (XALATAN) 0.005 % ophthalmic solution     . neomycin-polymyxin b-dexamethasone (MAXITROL) 3.5-10000-0.1 SUSP Reported on 01/02/2016  0  . omeprazole (PRILOSEC) 20 MG capsule Take 1 capsule (20 mg total) by mouth 2 (two) times daily before a meal. 60 capsule 3  . polyethylene glycol powder (GLYCOLAX/MIRALAX) powder Take 1 Container by mouth once. Reported on 01/02/2016    . predniSONE (DELTASONE) 20 MG tablet Take 1 tablet by mouth every other day    . rivaroxaban (XARELTO) 20 MG TABS tablet Take 1 tablet (20 mg total) by mouth daily. 30 tablet 3   No current facility-administered medications for this visit.    OBJECTIVE: Gen. status: Patient is alert oriented in the wheelchair.Improvement in performance status Lymphatic system: Supraclavicular, cervical, axillary, inguinal lymph nodes are not palpable HEENT: No abnormality detectedHEENT: Examination of the mouth is within normal limit Head exam was generally normal. There was no scleral icterus or corneal arcus. Mucous membranes were moist Lungs: Air entry equal on both sides.  GI: Abdomen is soft.  No tenderness.  Liver and spleen not palpable. Lower extremity swelling. Cardiac:Cardiac exam revealed the PMI to be normally situated and sized. The rhythm was regular and no extrasystoles were noted during several minutes of auscultation. The first and second heart sounds were normal and physiologic splitting of the second heart sound was noted. There were no murmurs, rubs, clicks, or gallops. Neurologically, the patient was awake, alert, and oriented to person, place and time. There were no obvious focal neurologic abnormalities.  Skeletal system within normal limits Filed Vitals:   01/30/16 0921  BP: 146/76  Pulse: 79  Temp: 96.5 F (35.8  C)  Resp: 18     Body mass index is 18.65 kg/(m^2).    Lab data has been reviewed CBC Latest Ref Rng 01/30/2016 01/06/2016 01/02/2016  WBC 3.6 - 11.0 K/uL 8.9 9.3 11.7(H)  Hemoglobin 12.0 - 16.0 g/dL 11.7(L) 11.4(L) 13.9  Hematocrit 35.0 - 47.0 % 35.2 33.9(L) 41.3  Platelets 150 - 440 K/uL 215 210 230                                                                                 STUDIES: Ct Chest W Contrast  01/08/2016  CLINICAL DATA:  Diffuse large B-cell lymphoma. Abdominal pain and right chest/ shoulder pain. Right lower extremity edema for 2 months. Recently treated for diverticulitis. EXAM: CT CHEST AND ABDOMEN WITH CONTRAST TECHNIQUE: Multidetector CT imaging of the chest and abdomen was  performed following the standard protocol during bolus administration of intravenous contrast. CONTRAST:  189mL ISOVUE-300 IOPAMIDOL (ISOVUE-300) INJECTION 61% COMPARISON:  Right lower extremity ultrasound of 01/06/2016. Abdominal pelvic CT of 12/13/2015. No comparison chest CT. FINDINGS: CT CHEST WITH CONTRAST Mediastinum/Lymph Nodes: No supraclavicular adenopathy. No axillary adenopathy. Tortuous thoracic aorta. Mild cardiomegaly, accentuated by a pectus excavatum deformity. Right-sided pulmonary emboli, including in the main right pulmonary artery on image 26/series 2. Extension into right upper and right lower lobe pulmonary artery branches. No evidence of elevated right heart pressure. The RV/LV ratio is significantly less than the 0.9. No mediastinal or hilar adenopathy. Subtle fluid level within the upper esophagus on image 17/series 2. Lungs/Pleura: Small right and trace left pleural effusions. Patchy pleural-based right upper lobe airspace and ground-glass opacity. Musculoskeletal: No acute osseous abnormality. Convex right thoracic spine curvature. CT ABDOMEN WITH CONTRAST Hepatobiliary: 5 mm hypo attenuating segment 4 lesion is unchanged and likely a cyst. Normal  gallbladder, without biliary ductal dilatation. Pancreas: The pancreas enhances normally. Fluid is identified about the pancreatic head and adjacent duodenal C-loop. Example image 78/ series 2. New since 12/13/2015. Spleen: Normal in size, without focal abnormality. Adrenals/Urinary Tract: Normal adrenal glands. bilateral too small to characterize lesions. No hydronephrosis. Stomach/Bowel: Normal stomach, without wall thickening. normal abdominal portions of the colon. Otherwise normal small bowel. Vascular/Lymphatic: Aortic and branch vessel atherosclerosis. Residual soft tissue thickening within the retroperitoneum, at the site of adenopathy back in December. Example adjacent the superior mesenteric artery at 1.6 x 1.1 cm on image 62/ series 2. Compare 2.2 x 1.5 cm on the prior. Other: No significant intraperitoneal fluid. Musculoskeletal: Osteopenia. IMPRESSION: 1. Relatively large volume of right-sided pulmonary emboli. Per technologist notes, Dr. Levora Dredge was called with these preliminary results at 8:54 a.m. these results will also be called to the ordering clinician or representative by the Radiologist Assistant, and communication documented in the PACS or zVision Dashboard. 2. Retroperitoneal fluid centered about the pancreatic head and duodenal C-loop. This could represent pancreatitis or duodenitis. Correlate with pancreatic enzymes. 3. Further improvement in soft tissue thickening within the retroperitoneum, consistent with treated lymphoma. 4. Right upper lobe pulmonary infarct. 5. Trace bilateral pleural effusions. 6. Esophageal air fluid level suggests dysmotility or gastroesophageal reflux. Electronically Signed   By: Abigail Miyamoto M.D.   On: 01/08/2016 09:33   Ct Abdomen W Contrast  01/08/2016  CLINICAL DATA:  Diffuse large B-cell lymphoma. Abdominal pain and right chest/ shoulder pain. Right lower extremity edema for 2 months. Recently treated for diverticulitis. EXAM: CT CHEST AND ABDOMEN WITH  CONTRAST TECHNIQUE: Multidetector CT imaging of the chest and abdomen was performed following the standard protocol during bolus administration of intravenous contrast. CONTRAST:  112mL ISOVUE-300 IOPAMIDOL (ISOVUE-300) INJECTION 61% COMPARISON:  Right lower extremity ultrasound of 01/06/2016. Abdominal pelvic CT of 12/13/2015. No comparison chest CT. FINDINGS: CT CHEST WITH CONTRAST Mediastinum/Lymph Nodes: No supraclavicular adenopathy. No axillary adenopathy. Tortuous thoracic aorta. Mild cardiomegaly, accentuated by a pectus excavatum deformity. Right-sided pulmonary emboli, including in the main right pulmonary artery on image 26/series 2. Extension into right upper and right lower lobe pulmonary artery branches. No evidence of elevated right heart pressure. The RV/LV ratio is significantly less than the 0.9. No mediastinal or hilar adenopathy. Subtle fluid level within the upper esophagus on image 17/series 2. Lungs/Pleura: Small right and trace left pleural effusions. Patchy pleural-based right upper lobe airspace and ground-glass opacity. Musculoskeletal: No acute osseous abnormality. Convex right thoracic spine curvature. CT ABDOMEN WITH CONTRAST Hepatobiliary:  5 mm hypo attenuating segment 4 lesion is unchanged and likely a cyst. Normal gallbladder, without biliary ductal dilatation. Pancreas: The pancreas enhances normally. Fluid is identified about the pancreatic head and adjacent duodenal C-loop. Example image 78/ series 2. New since 12/13/2015. Spleen: Normal in size, without focal abnormality. Adrenals/Urinary Tract: Normal adrenal glands. bilateral too small to characterize lesions. No hydronephrosis. Stomach/Bowel: Normal stomach, without wall thickening. normal abdominal portions of the colon. Otherwise normal small bowel. Vascular/Lymphatic: Aortic and branch vessel atherosclerosis. Residual soft tissue thickening within the retroperitoneum, at the site of adenopathy back in December. Example  adjacent the superior mesenteric artery at 1.6 x 1.1 cm on image 62/ series 2. Compare 2.2 x 1.5 cm on the prior. Other: No significant intraperitoneal fluid. Musculoskeletal: Osteopenia. IMPRESSION: 1. Relatively large volume of right-sided pulmonary emboli. Per technologist notes, Dr. Levora Dredge was called with these preliminary results at 8:54 a.m. these results will also be called to the ordering clinician or representative by the Radiologist Assistant, and communication documented in the PACS or zVision Dashboard. 2. Retroperitoneal fluid centered about the pancreatic head and duodenal C-loop. This could represent pancreatitis or duodenitis. Correlate with pancreatic enzymes. 3. Further improvement in soft tissue thickening within the retroperitoneum, consistent with treated lymphoma. 4. Right upper lobe pulmonary infarct. 5. Trace bilateral pleural effusions. 6. Esophageal air fluid level suggests dysmotility or gastroesophageal reflux. Electronically Signed   By: Abigail Miyamoto M.D.   On: 01/08/2016 09:33   US Venous Img Lower Unilateral Right  01/06/2016  CLINICAL DATA:  Right lower extremity swelling x3 weeks, pain x3 weeks EXAM: RIGHT LOWER EXTREMITY VENOUS DOPPLER ULTRASOUND TECHNIQUE: Gray-scale sonography with compression, as well as color and duplex ultrasound, were performed to evaluate the deep venous system from the level of the common femoral vein through the popliteal and proximal calf veins. COMPARISON:  None FINDINGS: There is hypoechoic occlusive noncompressible thrombus extending from posterior tibial and peroneal veins through the popliteal and femoral vein. The deep femoral vein and common femoral vein remain widely patent and compressible. Saphenofemoral junction is patent. Survey views of the contralateral common femoral vein are unremarkable. IMPRESSION: 1. POSITIVE for right lower extremity DVT extending from calf veins through the popliteal and femoral veins. These results will be called  to the ordering clinician or representative by the Radiologist Assistant, and communication documented in the PACS or zVision Dashboard. Electronically Signed   By: Lucrezia Europe M.D.   On: 01/06/2016 16:06    ASSESSMENT:  Patient had extensive lymphadenopathy with left supraclavicular area as well as abdominal mass Biopsies positive for diffuse B large cell lymphoma,  stage III Previous history of carcinoma of bladder, .  All lab data  has been reviewed  Patient has significant clinical improvement with rituximab therapy Because of bulky disease would like  to go with maintenance Rituxan therapy Because of significant clinical improvement will continue rituximab every 2 months PLAN:   Diffuse B large cell lymphoma   Continue rituximab CT scan of abdomen dated January 08, 2016   has been reviewed.  inDependently.  In reviewed with the patient CT scan of chest also has been reviewed and shows significant pulmonary emboli on the right side. Ultrasound of lower extremities consistent with deep vein thrombosis in right lower extremity Patient will continue Xeralto.. Ct scan abdomen reveals some retroperitoneal collection of fluid which will be observed.  At present time patient does not demonstrate any evidence of infection   Forest Gleason, MD   02/01/2016 3:07  PM

## 2016-02-03 DIAGNOSIS — H401112 Primary open-angle glaucoma, right eye, moderate stage: Secondary | ICD-10-CM | POA: Diagnosis not present

## 2016-02-10 ENCOUNTER — Encounter: Payer: Self-pay | Admitting: Family Medicine

## 2016-02-10 ENCOUNTER — Ambulatory Visit (INDEPENDENT_AMBULATORY_CARE_PROVIDER_SITE_OTHER): Payer: PPO | Admitting: Family Medicine

## 2016-02-10 VITALS — BP 112/54 | HR 80 | Temp 98.6°F | Wt 114.8 lb

## 2016-02-10 DIAGNOSIS — K5732 Diverticulitis of large intestine without perforation or abscess without bleeding: Secondary | ICD-10-CM | POA: Diagnosis not present

## 2016-02-10 DIAGNOSIS — C8339 Diffuse large B-cell lymphoma, extranodal and solid organ sites: Secondary | ICD-10-CM | POA: Diagnosis not present

## 2016-02-10 DIAGNOSIS — R7303 Prediabetes: Secondary | ICD-10-CM

## 2016-02-10 DIAGNOSIS — Z86711 Personal history of pulmonary embolism: Secondary | ICD-10-CM | POA: Insufficient documentation

## 2016-02-10 DIAGNOSIS — I2699 Other pulmonary embolism without acute cor pulmonale: Secondary | ICD-10-CM | POA: Diagnosis not present

## 2016-02-10 NOTE — Assessment & Plan Note (Signed)
Found on CT scan in March. Also found to have right lower extremity DVT. She's been on Xarelto started by oncology. Asymptomatic at this time. She will continue Xarelto. Given return precautions.

## 2016-02-10 NOTE — Assessment & Plan Note (Signed)
Resolved following treatment with ciprofloxacin and Flagyl. Benign exam today. Asymptomatic. She'll continue to monitor.

## 2016-02-10 NOTE — Patient Instructions (Signed)
Nice to see you. We are going to check her A1c today to evaluate for diabetes. Please continue your Xarelto. Please continue to follow with oncology. If you develop abdominal pain, nausea, vomiting, diarrhea, blood in her stool, fevers, chest pain, shortness of breath, or any new or changing symptoms please seek medical attention.

## 2016-02-10 NOTE — Progress Notes (Signed)
Pre visit review using our clinic review tool, if applicable. No additional management support is needed unless otherwise documented below in the visit note. 

## 2016-02-10 NOTE — Assessment & Plan Note (Signed)
Check A1c today.

## 2016-02-10 NOTE — Assessment & Plan Note (Signed)
Continuing to follow with oncology. Appears to be doing well.

## 2016-02-10 NOTE — Progress Notes (Signed)
Patient ID: Lisa Sosa, female   DOB: [birth year], 82 y.o.   MRN: [id number]  Lisa Rumps, MD Phone: [phone]  Lisa Sosa is a 82 y.o. female who presents today for follow-up.  Prediabetes: Last A1c found to be 6.1. Likely related to being on prednisone. She has continued on this. Has had intermittent elevated blood glucoses. Reports she feels well overall.  Since her last visit she was diagnosed with a PE and DVT. This was found on a CT scan that was ordered after she complained of right shoulder discomfort. She was started on Xarelto. She's not had any chest pain or shortness of breath with this. Notes her right leg was swollen and red though it is much less swollen now and less red. She is bruising slightly more easily than previously.  Day also found diverticulitis on CT scan as she did have some abdominal pain. She was treated with ciprofloxacin and Flagyl. She's not had any abdominal pain, nausea, vomiting, or diarrhea. No blood in her stool. She feels improved.  B-cell lymphoma is being followed by oncology. It appears that the lesion in her abdomen has gotten slightly smaller. Patient reports that her oncologist is happy with the results. She is continuing the prednisone.  PMH: nonsmoker.   ROS see history of present illness  Objective  Physical Exam Filed Vitals:   02/10/16 1500  BP: 112/54  Pulse: 80  Temp: 98.6 F (37 C)    BP Readings from Last 3 Encounters:  02/10/16 112/54  01/30/16 116/73  01/30/16 146/76   Wt Readings from Last 3 Encounters:  02/10/16 114 lb 12.8 oz (52.073 kg)  01/30/16 115 lb 8 oz (52.39 kg)  01/02/16 117 lb 3 oz (53.156 kg)    Physical Exam  Constitutional: She is well-developed, well-nourished, and in no distress.  HENT:  Head: Normocephalic and atraumatic.  Cardiovascular: Normal rate, regular rhythm and normal heart sounds.   Pulmonary/Chest: Effort normal and breath sounds normal.  Abdominal: Soft. Bowel sounds are  normal. She exhibits no distension. There is no tenderness. There is no rebound and no guarding.  Musculoskeletal:  Right leg slightly swollen compared to the left, no erythema, minimal anterior tenderness  Neurological: She is alert.  Skin: Skin is warm and dry. She is not diaphoretic.     Assessment/Plan: Please see individual problem list.  Prediabetes Check A1c today.  Diffuse large B cell lymphoma Continuing to follow with oncology. Appears to be doing well.  Pulmonary embolus ([place]) Found on CT scan in March. Also found to have right lower extremity DVT. She's been on Xarelto started by oncology. Asymptomatic at this time. She will continue Xarelto. Given return precautions.  Diverticulitis of colon Resolved following treatment with ciprofloxacin and Flagyl. Benign exam today. Asymptomatic. She'll continue to monitor.    Orders Placed This Encounter  Procedures  . HgB A1c    Lisa Rumps, MD [place]

## 2016-02-11 LAB — HEMOGLOBIN A1C: Hgb A1c MFr Bld: 5.8 % (ref 4.6–6.5)

## 2016-03-20 ENCOUNTER — Other Ambulatory Visit: Payer: Self-pay | Admitting: Oncology

## 2016-03-31 ENCOUNTER — Other Ambulatory Visit: Payer: Self-pay | Admitting: *Deleted

## 2016-03-31 DIAGNOSIS — C8339 Diffuse large B-cell lymphoma, extranodal and solid organ sites: Secondary | ICD-10-CM

## 2016-04-02 ENCOUNTER — Inpatient Hospital Stay: Payer: PPO | Attending: Internal Medicine

## 2016-04-02 ENCOUNTER — Inpatient Hospital Stay: Payer: PPO

## 2016-04-02 ENCOUNTER — Inpatient Hospital Stay (HOSPITAL_BASED_OUTPATIENT_CLINIC_OR_DEPARTMENT_OTHER): Payer: PPO | Admitting: Internal Medicine

## 2016-04-02 VITALS — BP 129/83 | HR 76 | Resp 20

## 2016-04-02 VITALS — BP 133/64 | HR 74 | Temp 95.5°F | Resp 18 | Wt 116.6 lb

## 2016-04-02 DIAGNOSIS — Z8042 Family history of malignant neoplasm of prostate: Secondary | ICD-10-CM | POA: Diagnosis not present

## 2016-04-02 DIAGNOSIS — C8333 Diffuse large B-cell lymphoma, intra-abdominal lymph nodes: Secondary | ICD-10-CM | POA: Diagnosis not present

## 2016-04-02 DIAGNOSIS — Z8551 Personal history of malignant neoplasm of bladder: Secondary | ICD-10-CM

## 2016-04-02 DIAGNOSIS — C8339 Diffuse large B-cell lymphoma, extranodal and solid organ sites: Secondary | ICD-10-CM

## 2016-04-02 DIAGNOSIS — Z86711 Personal history of pulmonary embolism: Secondary | ICD-10-CM

## 2016-04-02 DIAGNOSIS — Z7901 Long term (current) use of anticoagulants: Secondary | ICD-10-CM | POA: Diagnosis not present

## 2016-04-02 DIAGNOSIS — E119 Type 2 diabetes mellitus without complications: Secondary | ICD-10-CM | POA: Diagnosis not present

## 2016-04-02 DIAGNOSIS — Z800 Family history of malignant neoplasm of digestive organs: Secondary | ICD-10-CM | POA: Diagnosis not present

## 2016-04-02 DIAGNOSIS — Z5112 Encounter for antineoplastic immunotherapy: Secondary | ICD-10-CM | POA: Insufficient documentation

## 2016-04-02 DIAGNOSIS — Z79899 Other long term (current) drug therapy: Secondary | ICD-10-CM | POA: Diagnosis not present

## 2016-04-02 LAB — COMPREHENSIVE METABOLIC PANEL
ALBUMIN: 3.7 g/dL (ref 3.5–5.0)
ALT: 32 U/L (ref 14–54)
AST: 21 U/L (ref 15–41)
Alkaline Phosphatase: 47 U/L (ref 38–126)
Anion gap: 6 (ref 5–15)
BUN: 39 mg/dL — AB (ref 6–20)
CHLORIDE: 102 mmol/L (ref 101–111)
CO2: 31 mmol/L (ref 22–32)
Calcium: 9.2 mg/dL (ref 8.9–10.3)
Creatinine, Ser: 1.2 mg/dL — ABNORMAL HIGH (ref 0.44–1.00)
GFR calc Af Amer: 45 mL/min — ABNORMAL LOW (ref >60)
GFR calc non Af Amer: 39 mL/min — ABNORMAL LOW (ref >60)
GLUCOSE: 126 mg/dL — AB (ref 65–99)
POTASSIUM: 4 mmol/L (ref 3.5–5.1)
SODIUM: 139 mmol/L (ref 135–145)
Total Bilirubin: 0.7 mg/dL (ref 0.3–1.2)
Total Protein: 6.1 g/dL — ABNORMAL LOW (ref 6.5–8.1)

## 2016-04-02 LAB — CBC WITH DIFFERENTIAL/PLATELET
BASOS ABS: 0 10*3/uL (ref 0–0.1)
BASOS PCT: 0 %
Eosinophils Absolute: 0 10*3/uL (ref 0–0.7)
Eosinophils Relative: 1 %
HEMATOCRIT: 37.5 % (ref 35.0–47.0)
HEMOGLOBIN: 12.6 g/dL (ref 12.0–16.0)
Lymphocytes Relative: 21 %
Lymphs Abs: 1.5 10*3/uL (ref 1.0–3.6)
MCH: 30.5 pg (ref 26.0–34.0)
MCHC: 33.7 g/dL (ref 32.0–36.0)
MCV: 90.6 fL (ref 80.0–100.0)
Monocytes Absolute: 0.5 10*3/uL (ref 0.2–0.9)
Monocytes Relative: 6 %
Neutro Abs: 5.2 10*3/uL (ref 1.4–6.5)
Neutrophils Relative %: 72 %
Platelets: 164 10*3/uL (ref 150–440)
RBC: 4.13 MIL/uL (ref 3.80–5.20)
RDW: 16.1 % — ABNORMAL HIGH (ref 11.5–14.5)
WBC: 7.3 10*3/uL (ref 3.6–11.0)

## 2016-04-02 MED ORDER — SODIUM CHLORIDE 0.9 % IV SOLN
Freq: Once | INTRAVENOUS | Status: AC
  Administered 2016-04-02: 10:00:00 via INTRAVENOUS
  Filled 2016-04-02: qty 1000

## 2016-04-02 MED ORDER — SODIUM CHLORIDE 0.9 % IV SOLN
375.0000 mg/m2 | Freq: Once | INTRAVENOUS | Status: AC
  Administered 2016-04-02: 600 mg via INTRAVENOUS
  Filled 2016-04-02: qty 50

## 2016-04-02 MED ORDER — SODIUM CHLORIDE 0.9 % IV SOLN: 375.0000 mg/m2 | Freq: Once | INTRAVENOUS | Status: DC

## 2016-04-02 MED ORDER — ACETAMINOPHEN 325 MG PO TABS
650.0000 mg | ORAL_TABLET | Freq: Once | ORAL | Status: AC
  Administered 2016-04-02: 650 mg via ORAL
  Filled 2016-04-02: qty 2

## 2016-04-02 MED ORDER — DIPHENHYDRAMINE HCL 25 MG PO CAPS
50.0000 mg | ORAL_CAPSULE | Freq: Once | ORAL | Status: AC
  Administered 2016-04-02: 50 mg via ORAL
  Filled 2016-04-02: qty 2

## 2016-04-02 NOTE — Assessment & Plan Note (Addendum)
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 Clinically no evidence of progression noted.  # Continue with Proceed with Rituxan today; Continue prednisone 20 mg once day x10 days; continue rituxan every 2 months  # Recommend follow-up PET in 2 months/prior to next visit  # History of diabetes glaucoma- prednisone small doses  # History of PE- on Xarelto stable.

## 2016-04-02 NOTE — Progress Notes (Signed)
[place] OFFICE PROGRESS NOTE  Patient Care Team: [name], MD as PCP - General (Family Medicine) Seeplaputhur [name], MD as Consulting Physician (General Surgery) Provider Not In System  No matching staging information was found for the patient.   Oncology History   38.  82 year old lady presented with large left neck mass and abdominal palpable mass.  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  Patient did not want any chemotherapy so was started on prednisone followed by Rituxan in June of 2016 with excellent response 3.  Patient has finished 4 weekly cycle of rituximab on April 16, 2015 with excellent response  4. Because of bulky disease in the past patient is getting maintenance Rituxan therapy started in September of 2016. 5. was found to have a deep vein thrombosis on xeralto. 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  female pleasant patient above history of Diffuse large B-cell lymphoma on Rituxan prednisone is here for follow-up.  Patient denies any weight loss. Denies night sweats. Denies any new lumps or bumps. Appetite is good. No nausea no vomiting.  Complains of easy bruising secondary to be on Xarelto. The chest pain or shortness of breath or cough.  REVIEW OF SYSTEMS:  A complete 10 point review of system is done which is negative except mentioned above/history of present illness.   PAST MEDICAL HISTORY :  Past Medical History   Diagnosis Date  . Chickenpox   . Glaucoma   . Elevated blood pressure   . History of blood transfusion   . Cancer [facility]) 2011    gallbladder  . Diffuse large B cell lymphoma ([place]) 03/17/2015    PAST SURGICAL HISTORY :   Past Surgical History  Procedure Laterality Date  . Abdominal hysterectomy  1978    For fibroids  . Breast biopsy Right 1990  . Transurethral resection of bladder tumor  2011     FAMILY HISTORY :   Family History  Problem Relation Age of Onset  . Alcoholism    . Cancer - Lung      Parent  . Cancer - Ovarian Mother   . Prostate cancer      Other relative  . Stroke      Other relative   . High blood pressure      Other relative  . Diabetes      Other relative    SOCIAL HISTORY:   Social History  Substance Use Topics  . Smoking status: Never Smoker   . Smokeless tobacco: Not on file  . Alcohol Use: No    ALLERGIES:  is allergic to iron and codeine.  MEDICATIONS:  Current Outpatient Prescriptions  Medication Sig Dispense Refill  . Calcium Carbonate-Vitamin D (CALCIUM + D PO) Reported on 01/02/2016    . cyanocobalamin (,VITAMIN B-12,) 1000 MCG/ML injection INJECT 1 ML Q MONTH UTD  0  . fentaNYL (DURAGESIC - DOSED MCG/HR) 12 MCG/HR Place 1  patch (12.5 mcg total) onto the skin every 3 (three) days. 10 patch 0  . HYDROcodone-acetaminophen (NORCO/VICODIN) 5-325 MG tablet 0.5 - 1 tab 4 times a day as needed for pain 60 tablet 0  . latanoprost (XALATAN) 0.005 % ophthalmic solution     . neomycin-polymyxin b-dexamethasone (MAXITROL) 3.5-10000-0.1 SUSP Reported on 01/02/2016  0  . omeprazole (PRILOSEC) 20 MG capsule Take 1 capsule (20 mg total) by mouth 2 (two) times daily before a meal. 60 capsule 3  . predniSONE (DELTASONE) 20 MG tablet TAKE 1 TABLET(20 MG) BY MOUTH DAILY WITH BREAKFAST 30 tablet 0  . rivaroxaban (XARELTO) 20 MG TABS tablet Take 1 tablet (20 mg total) by mouth daily. 30 tablet 3  . brimonidine (ALPHAGAN) 0.2 % ophthalmic solution     .  timolol (TIMOPTIC) 0.5 % ophthalmic solution      No current facility-administered medications for this visit.    PHYSICAL EXAMINATION: ECOG PERFORMANCE STATUS: 2 - Symptomatic, <50% confined to bed  BP 133/64 mmHg  Pulse 74  Temp(Src) 95.5 F (35.3 C) (Tympanic)  Resp 18  Wt 116 lb 10 oz (52.9 kg)  Filed Weights   04/02/16 0854  Weight: 116 lb 10 oz (52.9 kg)    GENERAL: [name]  Elderly Caucasian female patient accompanied by daughter. She is able to sit on the exam table with great difficulty. Alert, no distress and comfortable.   She is walking with a walker. EYES: no pallor or icterus OROPHARYNX: no thrush or ulceration; good dentition  NECK: supple, no masses felt LYMPH:  no palpable lymphadenopathy in the cervical, axillary or inguinal regions LUNGS: clear to auscultation and  No wheeze or crackles HEART/CVS: regular rate & rhythm and no murmurs; No lower extremity edema ABDOMEN:abdomen soft, non-tender and normal bowel sounds Musculoskeletal:no cyanosis of digits and no clubbing  PSYCH: alert & oriented x 3 with fluent speech NEURO: no focal motor/sensory deficits SKIN:  no rashes or significant lesions  LABORATORY DATA:  I have reviewed the data as listed    Component Value Date/Time   NA 139 04/02/2016 0828   K 4.0 04/02/2016 0828   CL 102 04/02/2016 0828   CO2 31 04/02/2016 0828   GLUCOSE 126* 04/02/2016 0828   BUN 39* 04/02/2016 0828   CREATININE 1.20* 04/02/2016 0828   CALCIUM 9.2 04/02/2016 0828   PROT 6.1* 04/02/2016 0828   ALBUMIN 3.7 04/02/2016 0828   AST 21 04/02/2016 0828   ALT 32 04/02/2016 0828   ALKPHOS 47 04/02/2016 0828   BILITOT 0.7 04/02/2016 0828   GFRNONAA 39* 04/02/2016 0828   GFRAA 45* 04/02/2016 0828    No results found for: SPEP, UPEP  Lab Results  Component Value Date   WBC 7.3 04/02/2016   NEUTROABS 5.2 04/02/2016   HGB 12.6 04/02/2016   HCT 37.5 04/02/2016   MCV 90.6 04/02/2016   PLT 164 04/02/2016      Chemistry       Component Value Date/Time   NA 139 04/02/2016 0828   K 4.0 04/02/2016 0828   CL 102 04/02/2016 0828   CO2 31 04/02/2016 0828   BUN 39* 04/02/2016 0828   CREATININE 1.20* 04/02/2016 0828      Component Value Date/Time   CALCIUM 9.2 04/02/2016 0828   ALKPHOS 47 04/02/2016 0828   AST 21 04/02/2016 0828   ALT 32 04/02/2016 0828   BILITOT 0.7 04/02/2016 0828       RADIOGRAPHIC STUDIES: I have personally reviewed the radiological images as  listed and agreed with the findings in the report. No results found.   ASSESSMENT & PLAN:  Diffuse large B cell lymphoma (HCC) Diffuse large B-cell lymphoma on Rituxan every 2 months/and prednisone. Clinically no evidence of progression noted.  # Continue with Proceed with Rituxan today; Continue prednisone 20 mg once day x10 days; continue rituxan every 2 months  # Recommend follow-up PET in 2 months/prior to next visit  # History of diabetes glaucoma- prednisone small doses  # History of PE- on Xarelto stable.    Orders Placed This Encounter  Procedures  . NM PET Image Restag (PS) Skull Base To Thigh    Standing Status: Future     Number of Occurrences:      Standing Expiration Date: 06/02/2017    Order Specific Question:  Reason for Exam (SYMPTOM  OR DIAGNOSIS REQUIRED)    Answer:  diffuse large b cell lymphoma - on treatment    Order Specific Question:  Preferred imaging location?    Answer:  [facility]  . CBC with Differential    Standing Status: Future     Number of Occurrences:      Standing Expiration Date: 04/02/2017  . Comprehensive metabolic panel    Standing Status: Future     Number of Occurrences:      Standing Expiration Date: 04/02/2017    Order Specific Question:  Has the patient fasted?    Answer:  No  . Lactate dehydrogenase    Standing Status: Future     Number of Occurrences:      Standing Expiration Date: 04/02/2017   All questions were answered. The patient knows to call the clinic with any  problems, questions or concerns.      Cammie Sickle, MD 04/02/2016 9:22 AM

## 2016-04-03 ENCOUNTER — Telehealth: Payer: Self-pay | Admitting: Family Medicine

## 2016-04-03 ENCOUNTER — Other Ambulatory Visit: Payer: Self-pay | Admitting: *Deleted

## 2016-04-03 DIAGNOSIS — C8330 Diffuse large B-cell lymphoma, unspecified site: Secondary | ICD-10-CM

## 2016-04-03 MED ORDER — CYANOCOBALAMIN 1000 MCG/ML IJ SOLN: INTRAMUSCULAR | Status: DC

## 2016-04-03 NOTE — Telephone Encounter (Signed)
Will she need an appointment per Dr. Caryl Bis?

## 2016-04-03 NOTE — Telephone Encounter (Signed)
Spoke with patients daughter and she is going to call the cancer center to make sure they want to continue the B12 injections since they was the last person to prescribe. If they will not refill she will call us back.

## 2016-04-03 NOTE — Telephone Encounter (Signed)
Pt daughter called about needing a new Rx for her B12 injections. Daughter gives them to her at home. Let me know if pt needs an appt? Thank you!  Pharmacy is [facility] [id number] - [place], [place]  Call Delcie Roch daughter @ [phone] or cell [phone].

## 2016-04-03 NOTE — Telephone Encounter (Signed)
Asin gif she needs to continue these injections, if so, needs a refill

## 2016-04-26 ENCOUNTER — Other Ambulatory Visit: Payer: Self-pay | Admitting: Oncology

## 2016-04-29 ENCOUNTER — Telehealth: Payer: Self-pay | Admitting: *Deleted

## 2016-04-29 NOTE — Telephone Encounter (Signed)
Patients daughter called in to report patient may need tooth extracted, patient is on Xarelto. Please advise regarding need for medication management if patient needs tooth extraction.

## 2016-05-04 ENCOUNTER — Telehealth: Payer: Self-pay | Admitting: Internal Medicine

## 2016-05-04 NOTE — Telephone Encounter (Signed)
Informed Lisa Sosa to stop Xarelto 3 days prior to extraction and wait for 2 days after to restart, she repeated this back to me and said she is not sure she is going to go through with having it pulled or not

## 2016-05-04 NOTE — Telephone Encounter (Signed)
Please inform pt/daughter- Okay to STOP xarelto 3 days prior to tooth extraction; and okay to start 2 days later/ or as per directions from the dentist. Dr.B

## 2016-05-13 ENCOUNTER — Ambulatory Visit (INDEPENDENT_AMBULATORY_CARE_PROVIDER_SITE_OTHER): Payer: PPO | Admitting: Family Medicine

## 2016-05-13 ENCOUNTER — Encounter: Payer: Self-pay | Admitting: Family Medicine

## 2016-05-13 VITALS — BP 110/58 | HR 99 | Temp 98.5°F | Wt 120.4 lb

## 2016-05-13 DIAGNOSIS — C8333 Diffuse large B-cell lymphoma, intra-abdominal lymph nodes: Secondary | ICD-10-CM | POA: Diagnosis not present

## 2016-05-13 DIAGNOSIS — E538 Deficiency of other specified B group vitamins: Secondary | ICD-10-CM | POA: Diagnosis not present

## 2016-05-13 DIAGNOSIS — R600 Localized edema: Secondary | ICD-10-CM | POA: Diagnosis not present

## 2016-05-13 DIAGNOSIS — R7303 Prediabetes: Secondary | ICD-10-CM

## 2016-05-13 LAB — COMPREHENSIVE METABOLIC PANEL
ALK PHOS: 81 U/L (ref 39–117)
ALT: 8 U/L (ref 0–35)
AST: 14 U/L (ref 0–37)
Albumin: 3.8 g/dL (ref 3.5–5.2)
BUN: 22 mg/dL (ref 6–23)
CHLORIDE: 105 meq/L (ref 96–112)
CO2: 31 mEq/L (ref 19–32)
Calcium: 9.8 mg/dL (ref 8.4–10.5)
Creatinine, Ser: 1.19 mg/dL (ref 0.40–1.20)
GFR: 45.18 mL/min — AB (ref >60.00)
GLUCOSE: 115 mg/dL — AB (ref 70–99)
POTASSIUM: 4.4 meq/L (ref 3.5–5.1)
Sodium: 143 mEq/L (ref 135–145)
TOTAL PROTEIN: 6.7 g/dL (ref 6.0–8.3)
Total Bilirubin: 0.4 mg/dL (ref 0.2–1.2)

## 2016-05-13 LAB — CBC
HCT: 36.5 % (ref 36.0–46.0)
HEMOGLOBIN: 12 g/dL (ref 12.0–15.0)
MCHC: 32.9 g/dL (ref 30.0–36.0)
MCV: 90.3 fl (ref 78.0–100.0)
PLATELETS: 228 10*3/uL (ref 150.0–400.0)
RBC: 4.04 Mil/uL (ref 3.87–5.11)
RDW: 15.3 % (ref 11.5–15.5)
WBC: 5.6 10*3/uL (ref 4.0–10.5)

## 2016-05-13 LAB — TSH: TSH: 5.78 u[IU]/mL — ABNORMAL HIGH (ref 0.35–4.50)

## 2016-05-13 LAB — VITAMIN B12: VITAMIN B 12: 386 pg/mL (ref 211–911)

## 2016-05-13 LAB — HEMOGLOBIN A1C: Hgb A1c MFr Bld: 5.7 % (ref 4.6–6.5)

## 2016-05-13 NOTE — Assessment & Plan Note (Signed)
Bilateral lower extremity edema. No CHF symptoms. Could be venous insufficiency. Is on Xarelto so DVT unlikely. We will check lab work to evaluate for other causes. Advised to keep legs propped up. Could consider compression stockings. Given return precautions.

## 2016-05-13 NOTE — Progress Notes (Signed)
Pre visit review using our clinic review tool, if applicable. No additional management support is needed unless otherwise documented below in the visit note. 

## 2016-05-13 NOTE — Assessment & Plan Note (Signed)
Appears to be doing well. Continue to follow with oncology for this.

## 2016-05-13 NOTE — Assessment & Plan Note (Signed)
Check A1c today.

## 2016-05-13 NOTE — Patient Instructions (Signed)
Nice to see you. We are going to order some lab work to evaluate your leg swelling. Please keep your follow-up with oncology. If you develop shortness of breath, chest pain, trouble breathing when lying flat, or any new or changing symptoms please seek medical attention.

## 2016-05-13 NOTE — Progress Notes (Signed)
  Tommi Rumps, MD Phone: [phone]  Lisa Sosa is a 82 y.o. female who presents today for follow-up.  Bilateral lower extremity edema: Patient notes she has had lower extremity swelling for a long time. Has worsened somewhat over the last week or so. Is on Xarelto for a right-sided DVT. Has not missed any doses of Xarelto. No shortness of breath, orthopnea, PND, or chest pain. Some burning in her legs. Improves with elevation and legs.  Prediabetes: Is off prednisone now. Stopped on June 3. Plan to recheck A1c today.  Lymphoma: Patient has been off the prednisone. They're following up with oncology later this month and having a PET scan to follow up on this further. No weight loss.  PMH: nonsmoker.   ROS see history of present illness  Objective  Physical Exam Vitals:   05/13/16 1409  BP: (!) 110/58  Pulse: 99  Temp: 98.5 F (36.9 C)    BP Readings from Last 3 Encounters:  05/13/16 (!) 110/58  04/02/16 129/83  04/02/16 133/64   Wt Readings from Last 3 Encounters:  05/13/16 120 lb 6.4 oz (54.6 kg)  04/02/16 116 lb 10 oz (52.9 kg)  02/10/16 114 lb 12.8 oz (52.1 kg)    Physical Exam  Constitutional: No distress.  HENT:  Head: Normocephalic and atraumatic.  Cardiovascular: Normal rate, regular rhythm and normal heart sounds.   Nonpitting edema noted bilateral lower extremities right slightly greater than left  Pulmonary/Chest: Effort normal and breath sounds normal.  Neurological: She is alert. Gait normal.  Skin: Skin is warm and dry. She is not diaphoretic.     Assessment/Plan: Please see individual problem list.  Prediabetes Check A1c today.  Diffuse large B cell lymphoma (HCC) Appears to be doing well. Continue to follow with oncology for this.  Bilateral lower extremity edema Bilateral lower extremity edema. No CHF symptoms. Could be venous insufficiency. Is on Xarelto so DVT unlikely. We will check lab work to evaluate for other causes. Advised  to keep legs propped up. Could consider compression stockings. Given return precautions.   Orders Placed This Encounter  Procedures  . Comp Met (CMET)  . CBC  . TSH  . HgB A1c  . B12   Tommi Rumps, MD [place]

## 2016-05-21 ENCOUNTER — Other Ambulatory Visit: Payer: Self-pay | Admitting: Family Medicine

## 2016-05-21 DIAGNOSIS — R7989 Other specified abnormal findings of blood chemistry: Secondary | ICD-10-CM

## 2016-05-27 ENCOUNTER — Ambulatory Visit
Admission: RE | Admit: 2016-05-27 | Discharge: 2016-05-27 | Disposition: A | Discharge status: Home / Self Care | Payer: PPO | Source: Ambulatory Visit | Attending: Internal Medicine | Admitting: Internal Medicine

## 2016-05-27 DIAGNOSIS — C8330 Diffuse large B-cell lymphoma, unspecified site: Secondary | ICD-10-CM | POA: Diagnosis not present

## 2016-05-27 DIAGNOSIS — C8333 Diffuse large B-cell lymphoma, intra-abdominal lymph nodes: Secondary | ICD-10-CM | POA: Insufficient documentation

## 2016-05-27 LAB — GLUCOSE, CAPILLARY: Glucose-Capillary: 84 mg/dL (ref 65–99)

## 2016-05-27 MED ORDER — FLUDEOXYGLUCOSE F - 18 (FDG) INJECTION
12.0000 | Freq: Once | INTRAVENOUS | Status: AC | PRN
  Administered 2016-05-27: 12.35 via INTRAVENOUS

## 2016-06-03 ENCOUNTER — Inpatient Hospital Stay: Payer: PPO | Attending: Internal Medicine | Admitting: Internal Medicine

## 2016-06-03 ENCOUNTER — Other Ambulatory Visit: Payer: Self-pay | Admitting: Oncology

## 2016-06-03 ENCOUNTER — Inpatient Hospital Stay: Payer: PPO

## 2016-06-03 ENCOUNTER — Encounter: Payer: Self-pay | Admitting: Internal Medicine

## 2016-06-03 VITALS — BP 95/58 | HR 76 | Temp 95.9°F | Resp 16 | Ht 66.0 in | Wt 120.9 lb

## 2016-06-03 DIAGNOSIS — C8333 Diffuse large B-cell lymphoma, intra-abdominal lymph nodes: Secondary | ICD-10-CM

## 2016-06-03 DIAGNOSIS — Z86718 Personal history of other venous thrombosis and embolism: Secondary | ICD-10-CM | POA: Diagnosis not present

## 2016-06-03 DIAGNOSIS — Z8551 Personal history of malignant neoplasm of bladder: Secondary | ICD-10-CM | POA: Diagnosis not present

## 2016-06-03 DIAGNOSIS — Z8589 Personal history of malignant neoplasm of other organs and systems: Secondary | ICD-10-CM | POA: Diagnosis not present

## 2016-06-03 DIAGNOSIS — I2699 Other pulmonary embolism without acute cor pulmonale: Secondary | ICD-10-CM

## 2016-06-03 DIAGNOSIS — Z79899 Other long term (current) drug therapy: Secondary | ICD-10-CM | POA: Diagnosis not present

## 2016-06-03 DIAGNOSIS — Z8042 Family history of malignant neoplasm of prostate: Secondary | ICD-10-CM | POA: Diagnosis not present

## 2016-06-03 DIAGNOSIS — Z5112 Encounter for antineoplastic immunotherapy: Secondary | ICD-10-CM | POA: Insufficient documentation

## 2016-06-03 DIAGNOSIS — C8330 Diffuse large B-cell lymphoma, unspecified site: Secondary | ICD-10-CM

## 2016-06-03 DIAGNOSIS — E119 Type 2 diabetes mellitus without complications: Secondary | ICD-10-CM

## 2016-06-03 DIAGNOSIS — Z86711 Personal history of pulmonary embolism: Secondary | ICD-10-CM | POA: Diagnosis not present

## 2016-06-03 DIAGNOSIS — C8339 Diffuse large B-cell lymphoma, extranodal and solid organ sites: Secondary | ICD-10-CM

## 2016-06-03 DIAGNOSIS — Z7901 Long term (current) use of anticoagulants: Secondary | ICD-10-CM | POA: Diagnosis not present

## 2016-06-03 DIAGNOSIS — I10 Essential (primary) hypertension: Secondary | ICD-10-CM | POA: Diagnosis not present

## 2016-06-03 LAB — CBC WITH DIFFERENTIAL/PLATELET
BASOS ABS: 0 10*3/uL (ref 0–0.1)
Basophils Relative: 1 %
EOS PCT: 3 %
Eosinophils Absolute: 0.1 10*3/uL (ref 0–0.7)
HEMATOCRIT: 34.2 % — AB (ref 35.0–47.0)
Hemoglobin: 11.5 g/dL — ABNORMAL LOW (ref 12.0–16.0)
LYMPHS ABS: 1.1 10*3/uL (ref 1.0–3.6)
LYMPHS PCT: 21 %
MCH: 30.2 pg (ref 26.0–34.0)
MCHC: 33.6 g/dL (ref 32.0–36.0)
MCV: 89.9 fL (ref 80.0–100.0)
MONO ABS: 0.5 10*3/uL (ref 0.2–0.9)
Monocytes Relative: 10 %
NEUTROS ABS: 3.3 10*3/uL (ref 1.4–6.5)
Neutrophils Relative %: 65 %
PLATELETS: 223 10*3/uL (ref 150–440)
RBC: 3.81 MIL/uL (ref 3.80–5.20)
RDW: 14.3 % (ref 11.5–14.5)
WBC: 5.1 10*3/uL (ref 3.6–11.0)

## 2016-06-03 LAB — COMPREHENSIVE METABOLIC PANEL
ALT: 9 U/L — AB (ref 14–54)
AST: 17 U/L (ref 15–41)
Albumin: 3.6 g/dL (ref 3.5–5.0)
Alkaline Phosphatase: 75 U/L (ref 38–126)
Anion gap: 5 (ref 5–15)
BILIRUBIN TOTAL: 0.4 mg/dL (ref 0.3–1.2)
BUN: 28 mg/dL — AB (ref 6–20)
CO2: 27 mmol/L (ref 22–32)
Calcium: 9 mg/dL (ref 8.9–10.3)
Chloride: 105 mmol/L (ref 101–111)
Creatinine, Ser: 1.1 mg/dL — ABNORMAL HIGH (ref 0.44–1.00)
GFR, EST AFRICAN AMERICAN: 49 mL/min — AB (ref >60)
GFR, EST NON AFRICAN AMERICAN: 43 mL/min — AB (ref >60)
Glucose, Bld: 99 mg/dL (ref 65–99)
POTASSIUM: 3.8 mmol/L (ref 3.5–5.1)
Sodium: 137 mmol/L (ref 135–145)
TOTAL PROTEIN: 6.3 g/dL — AB (ref 6.5–8.1)

## 2016-06-03 LAB — LACTATE DEHYDROGENASE: LDH: 146 U/L (ref 98–192)

## 2016-06-03 MED ORDER — ACETAMINOPHEN 325 MG PO TABS
650.0000 mg | ORAL_TABLET | Freq: Once | ORAL | Status: AC
  Administered 2016-06-03: 650 mg via ORAL
  Filled 2016-06-03: qty 2

## 2016-06-03 MED ORDER — SODIUM CHLORIDE 0.9 % IV SOLN
375.0000 mg/m2 | Freq: Once | INTRAVENOUS | Status: DC
Start: 2016-06-03 — End: 2016-06-03

## 2016-06-03 MED ORDER — SODIUM CHLORIDE 0.9 % IV SOLN
375.0000 mg/m2 | Freq: Once | INTRAVENOUS | Status: AC
  Administered 2016-06-03: 600 mg via INTRAVENOUS
  Filled 2016-06-03: qty 60

## 2016-06-03 MED ORDER — SODIUM CHLORIDE 0.9 % IV SOLN
Freq: Once | INTRAVENOUS | Status: AC
  Administered 2016-06-03: 11:00:00 via INTRAVENOUS
  Filled 2016-06-03: qty 1000

## 2016-06-03 MED ORDER — DIPHENHYDRAMINE HCL 25 MG PO CAPS
50.0000 mg | ORAL_CAPSULE | Freq: Once | ORAL | Status: AC
Start: 2016-06-03 — End: 2016-06-03
  Administered 2016-06-03: 50 mg via ORAL
  Filled 2016-06-03: qty 2

## 2016-06-03 NOTE — Progress Notes (Signed)
PET scan last week.  Pt reports having edema in both legs right being worse.

## 2016-06-03 NOTE — Progress Notes (Signed)
[place] OFFICE PROGRESS NOTE  Patient Care Team: [name], MD as PCP - General (Family Medicine) Seeplaputhur [name], MD as Consulting Physician (General Surgery) Provider Not In System  No matching staging information was found for the patient.   Oncology History   48.  82yo- year-old lady presented with large left neck mass and abdominal palpable mass.  Biopsy of left neck mass is being positive for diffuse B large cell lymphoma; Clinically staged as stage III (June, 2016) 2.  Patient did not want any chemotherapy so was started on prednisone followed by Rituxan in June of 2016 with excellent response 3.  Patient has finished 4 weekly cycle of rituximab on April 16, 2015 with excellent response  4. Because of bulky disease in the past patient is getting maintenance Rituxan therapy started in September of 2016.  # R LE DVT- was found to have a deep vein thrombosis on xeralto. (March, 2017); And right-sided pulmonary emboli  # AUG 2017- PET- NED.       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  female pleasant patient above history of Diffuse large B-cell lymphoma on Rituxan prednisone is here for follow-up/ Review the results of the restaging PET scan.  No new lumps or bumps. Complains of chronic swelling of the legs. Continues to be on Xarelto.  Patient denies any weight loss. Denies night sweats. Denies any new lumps or bumps. Appetite is good. No nausea no vomiting. No chest pain or shortness of breath or cough.  REVIEW OF SYSTEMS:  A complete 10  point review of system is done which is negative except mentioned above/history of present illness.   PAST MEDICAL HISTORY :  Past Medical History:  Diagnosis Date  . Cancer [facility]) 2011   gallbladder  . Chickenpox   . Diffuse large B cell lymphoma ([place]) 03/17/2015  . Elevated blood pressure   . Glaucoma   . History of blood transfusion     PAST SURGICAL HISTORY :   Past Surgical History:  Procedure Laterality Date  . ABDOMINAL HYSTERECTOMY  1978   For fibroids  . BREAST BIOPSY Right 1990  . TRANSURETHRAL RESECTION OF BLADDER TUMOR  2011     FAMILY HISTORY :   Family History  Problem Relation Age of Onset  . Alcoholism    . Cancer - Lung      Parent  . Cancer - Ovarian Mother   . Prostate cancer      Other relative  . Stroke      Other relative   . High blood pressure      Other relative  . Diabetes      Other relative    SOCIAL HISTORY:   Social History  Substance Use Topics  . Smoking status: Never Smoker  . Smokeless tobacco: Never Used  . Alcohol use No    ALLERGIES:  is allergic to iron and codeine.  MEDICATIONS:  Current Outpatient Prescriptions  Medication Sig Dispense  Refill  . brimonidine (ALPHAGAN) 0.2 % ophthalmic solution     . latanoprost (XALATAN) 0.005 % ophthalmic solution     . timolol (TIMOPTIC) 0.5 % ophthalmic solution     . XARELTO 20 MG TABS tablet TAKE 1 TABLET(20 MG) BY MOUTH DAILY 30 tablet 0  . fentaNYL (DURAGESIC - DOSED MCG/HR) 12 MCG/HR Place 1 patch (12.5 mcg total) onto the skin every 3 (three) days. (Patient not taking: Reported on 06/03/2016) 10 patch 0  . HYDROcodone-acetaminophen (NORCO/VICODIN) 5-325 MG tablet 0.5 - 1 tab 4 times a day as needed for pain (Patient not taking: Reported on 06/03/2016) 60 tablet 0   No current facility-administered medications for this visit.     PHYSICAL EXAMINATION: ECOG PERFORMANCE STATUS: 2 - Symptomatic, <50% confined to bed  BP (!) 95/58 (BP Location: Left Arm, Patient Position:  Sitting)   Pulse 76   Temp (!) 95.9 F (35.5 C) (Tympanic)   Resp 16   Ht 5\' 6"  (1.676 m)   Wt 120 lb 14.4 oz (54.8 kg)   BMI 19.51 kg/m   Filed Weights   06/03/16 0941  Weight: 120 lb 14.4 oz (54.8 kg)    GENERAL: [name]  Elderly Caucasian female patient accompanied by daughter. She is able to sit on the exam table with great difficulty. Alert, no distress and comfortable.   She is walking with a walker. EYES: no pallor or icterus OROPHARYNX: no thrush or ulceration; good dentition  NECK: supple, no masses felt LYMPH:  no palpable lymphadenopathy in the cervical, axillary or inguinal regions LUNGS: clear to auscultation and  No wheeze or crackles HEART/CVS: regular rate & rhythm and no murmurs; No lower extremity edema ABDOMEN:abdomen soft, non-tender and normal bowel sounds Musculoskeletal:no cyanosis of digits and no clubbing  PSYCH: alert & oriented x 3 with fluent speech NEURO: no focal motor/sensory deficits SKIN:  no rashes or significant lesions  LABORATORY DATA:  I have reviewed the data as listed    Component Value Date/Time   NA 137 06/03/2016 0900   K 3.8 06/03/2016 0900   CL 105 06/03/2016 0900   CO2 27 06/03/2016 0900   GLUCOSE 99 06/03/2016 0900   BUN 28 (H) 06/03/2016 0900   CREATININE 1.10 (H) 06/03/2016 0900   CALCIUM 9.0 06/03/2016 0900   PROT 6.3 (L) 06/03/2016 0900   ALBUMIN 3.6 06/03/2016 0900   AST 17 06/03/2016 0900   ALT 9 (L) 06/03/2016 0900   ALKPHOS 75 06/03/2016 0900   BILITOT 0.4 06/03/2016 0900   GFRNONAA 43 (L) 06/03/2016 0900   GFRAA 49 (L) 06/03/2016 0900    No results found for: SPEP, UPEP  Lab Results  Component Value Date   WBC 5.1 06/03/2016   NEUTROABS 3.3 06/03/2016   HGB 11.5 (L) 06/03/2016   HCT 34.2 (L) 06/03/2016   MCV 89.9 06/03/2016   PLT 223 06/03/2016      Chemistry      Component Value Date/Time   NA 137 06/03/2016 0900   K 3.8 06/03/2016 0900   CL 105 06/03/2016 0900   CO2 27 06/03/2016 0900   BUN 28  (H) 06/03/2016 0900   CREATININE 1.10 (H) 06/03/2016 0900      Component Value Date/Time   CALCIUM 9.0 06/03/2016 0900   ALKPHOS 75 06/03/2016 0900   AST 17 06/03/2016 0900   ALT 9 (L) 06/03/2016 0900   BILITOT 0.4 06/03/2016 0900       RADIOGRAPHIC STUDIES: I have personally reviewed the radiological  images as listed and agreed with the findings in the report. No results found.   ASSESSMENT & PLAN:  Diffuse large B cell lymphoma (HCC) Diffuse large B-cell lymphoma on Rituxan every 2 months/and prednisone. Clinically no evidence of progression noted. PET- AUG 16th 2017- NED.   # Continue with Proceed with Rituxan today;  STOP prednisone [sec to GI upset] continue rituxan every 2 months  # History of diabetes glaucoma- off prednisone.   # RIght LE DVT/ swelling- recommend elevation/ stocking- History of PE- on Xarelto stable.  Follow up in 2 months/labs/rituxan infusions.   # I reviewed the blood work- with the patient in detail; also reviewed the imaging independently [as summarized above]; and with the patient in detail.   # 25 minutes face-to-face with the patient discussing the above plan of care; more than 50% of time spent on prognosis/ natural history; counseling and coordination.  Orders Placed This Encounter  Procedures  . CBC with Differential    Standing Status:   Future    Standing Expiration Date:   06/03/2017  . Comprehensive metabolic panel    Standing Status:   Future    Standing Expiration Date:   06/03/2017  . Lactate dehydrogenase    Standing Status:   Future    Standing Expiration Date:   06/03/2017   All questions were answered. The patient knows to call the clinic with any problems, questions or concerns.      Cammie Sickle, MD 06/03/2016 5:23 PM

## 2016-06-03 NOTE — Assessment & Plan Note (Signed)
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 Clinically no evidence of progression noted. PET- AUG 16th 2017- NED.   # Continue with Proceed with Rituxan today;  STOP prednisone [sec to GI upset] continue rituxan every 2 months  # History of diabetes glaucoma- off prednisone.   # RIght LE DVT/ swelling- recommend elevation/ stocking- History of PE- on Xarelto stable.  Follow up in 2 months/labs/rituxan infusions.   # I reviewed the blood work- with the patient in detail; also reviewed the imaging independently [as summarized above]; and with the patient in detail.

## 2016-06-04 ENCOUNTER — Other Ambulatory Visit: Payer: Self-pay | Admitting: *Deleted

## 2016-06-25 ENCOUNTER — Other Ambulatory Visit (INDEPENDENT_AMBULATORY_CARE_PROVIDER_SITE_OTHER): Payer: PPO

## 2016-06-25 DIAGNOSIS — R946 Abnormal results of thyroid function studies: Secondary | ICD-10-CM

## 2016-06-25 DIAGNOSIS — R7989 Other specified abnormal findings of blood chemistry: Secondary | ICD-10-CM

## 2016-06-25 LAB — T4, FREE: Free T4: 0.77 ng/dL (ref 0.60–1.60)

## 2016-06-25 LAB — TSH: TSH: 4.17 u[IU]/mL (ref 0.35–4.50)

## 2016-06-25 LAB — T3, FREE: T3, Free: 2.7 pg/mL (ref 2.3–4.2)

## 2016-07-07 ENCOUNTER — Other Ambulatory Visit: Payer: Self-pay | Admitting: Internal Medicine

## 2016-07-07 DIAGNOSIS — C8339 Diffuse large B-cell lymphoma, extranodal and solid organ sites: Secondary | ICD-10-CM

## 2016-07-07 DIAGNOSIS — I2699 Other pulmonary embolism without acute cor pulmonale: Secondary | ICD-10-CM

## 2016-08-03 ENCOUNTER — Inpatient Hospital Stay (HOSPITAL_BASED_OUTPATIENT_CLINIC_OR_DEPARTMENT_OTHER): Payer: PPO | Admitting: Internal Medicine

## 2016-08-03 ENCOUNTER — Inpatient Hospital Stay: Payer: PPO

## 2016-08-03 ENCOUNTER — Inpatient Hospital Stay: Payer: PPO | Attending: Internal Medicine

## 2016-08-03 VITALS — BP 98/58 | HR 78 | Temp 97.5°F | Resp 18 | Wt 121.9 lb

## 2016-08-03 DIAGNOSIS — Z79899 Other long term (current) drug therapy: Secondary | ICD-10-CM

## 2016-08-03 DIAGNOSIS — Z86718 Personal history of other venous thrombosis and embolism: Secondary | ICD-10-CM

## 2016-08-03 DIAGNOSIS — C8330 Diffuse large B-cell lymphoma, unspecified site: Secondary | ICD-10-CM | POA: Diagnosis not present

## 2016-08-03 DIAGNOSIS — C8333 Diffuse large B-cell lymphoma, intra-abdominal lymph nodes: Secondary | ICD-10-CM

## 2016-08-03 DIAGNOSIS — Z8551 Personal history of malignant neoplasm of bladder: Secondary | ICD-10-CM | POA: Insufficient documentation

## 2016-08-03 DIAGNOSIS — E119 Type 2 diabetes mellitus without complications: Secondary | ICD-10-CM

## 2016-08-03 DIAGNOSIS — Z8042 Family history of malignant neoplasm of prostate: Secondary | ICD-10-CM | POA: Insufficient documentation

## 2016-08-03 DIAGNOSIS — Z801 Family history of malignant neoplasm of trachea, bronchus and lung: Secondary | ICD-10-CM | POA: Diagnosis not present

## 2016-08-03 DIAGNOSIS — Z5112 Encounter for antineoplastic immunotherapy: Secondary | ICD-10-CM | POA: Diagnosis not present

## 2016-08-03 DIAGNOSIS — Z8589 Personal history of malignant neoplasm of other organs and systems: Secondary | ICD-10-CM | POA: Insufficient documentation

## 2016-08-03 DIAGNOSIS — Z23 Encounter for immunization: Secondary | ICD-10-CM

## 2016-08-03 DIAGNOSIS — I2699 Other pulmonary embolism without acute cor pulmonale: Secondary | ICD-10-CM

## 2016-08-03 DIAGNOSIS — Z86711 Personal history of pulmonary embolism: Secondary | ICD-10-CM | POA: Diagnosis not present

## 2016-08-03 DIAGNOSIS — Z8041 Family history of malignant neoplasm of ovary: Secondary | ICD-10-CM | POA: Diagnosis not present

## 2016-08-03 DIAGNOSIS — Z7901 Long term (current) use of anticoagulants: Secondary | ICD-10-CM | POA: Diagnosis not present

## 2016-08-03 DIAGNOSIS — C8339 Diffuse large B-cell lymphoma, extranodal and solid organ sites: Secondary | ICD-10-CM

## 2016-08-03 LAB — COMPREHENSIVE METABOLIC PANEL
ALBUMIN: 3.8 g/dL (ref 3.5–5.0)
ALT: 11 U/L — AB (ref 14–54)
AST: 18 U/L (ref 15–41)
Alkaline Phosphatase: 95 U/L (ref 38–126)
Anion gap: 6 (ref 5–15)
BILIRUBIN TOTAL: 0.7 mg/dL (ref 0.3–1.2)
BUN: 32 mg/dL — AB (ref 6–20)
CHLORIDE: 107 mmol/L (ref 101–111)
CO2: 28 mmol/L (ref 22–32)
CREATININE: 1.05 mg/dL — AB (ref 0.44–1.00)
Calcium: 9.3 mg/dL (ref 8.9–10.3)
GFR calc Af Amer: 52 mL/min — ABNORMAL LOW (ref >60)
GFR calc non Af Amer: 45 mL/min — ABNORMAL LOW (ref >60)
GLUCOSE: 135 mg/dL — AB (ref 65–99)
POTASSIUM: 4 mmol/L (ref 3.5–5.1)
Sodium: 141 mmol/L (ref 135–145)
Total Protein: 6.6 g/dL (ref 6.5–8.1)

## 2016-08-03 LAB — CBC WITH DIFFERENTIAL/PLATELET
BASOS PCT: 1 %
Basophils Absolute: 0 10*3/uL (ref 0–0.1)
Eosinophils Absolute: 0.2 10*3/uL (ref 0–0.7)
Eosinophils Relative: 4 %
HEMATOCRIT: 34.6 % — AB (ref 35.0–47.0)
HEMOGLOBIN: 11.7 g/dL — AB (ref 12.0–16.0)
LYMPHS ABS: 1.2 10*3/uL (ref 1.0–3.6)
LYMPHS PCT: 28 %
MCH: 29.5 pg (ref 26.0–34.0)
MCHC: 33.7 g/dL (ref 32.0–36.0)
MCV: 87.5 fL (ref 80.0–100.0)
MONO ABS: 0.4 10*3/uL (ref 0.2–0.9)
MONOS PCT: 10 %
NEUTROS ABS: 2.5 10*3/uL (ref 1.4–6.5)
NEUTROS PCT: 57 %
Platelets: 198 10*3/uL (ref 150–440)
RBC: 3.95 MIL/uL (ref 3.80–5.20)
RDW: 14.3 % (ref 11.5–14.5)
WBC: 4.3 10*3/uL (ref 3.6–11.0)

## 2016-08-03 LAB — LACTATE DEHYDROGENASE: LDH: 145 U/L (ref 98–192)

## 2016-08-03 MED ORDER — ACETAMINOPHEN 325 MG PO TABS
650.0000 mg | ORAL_TABLET | Freq: Once | ORAL | Status: AC
  Administered 2016-08-03: 650 mg via ORAL
  Filled 2016-08-03: qty 2

## 2016-08-03 MED ORDER — RIVAROXABAN 20 MG PO TABS: 20.0000 mg | ORAL_TABLET | Freq: Every day | ORAL | 2 refills | Status: DC

## 2016-08-03 MED ORDER — HEPARIN SOD (PORK) LOCK FLUSH 100 UNIT/ML IV SOLN: 500.0000 [IU] | Freq: Once | INTRAVENOUS | Status: DC | PRN

## 2016-08-03 MED ORDER — SODIUM CHLORIDE 0.9 % IV SOLN
375.0000 mg/m2 | Freq: Once | INTRAVENOUS | Status: AC
  Administered 2016-08-03: 600 mg via INTRAVENOUS
  Filled 2016-08-03: qty 10

## 2016-08-03 MED ORDER — DIPHENHYDRAMINE HCL 25 MG PO CAPS
50.0000 mg | ORAL_CAPSULE | Freq: Once | ORAL | Status: AC
  Administered 2016-08-03: 50 mg via ORAL
  Filled 2016-08-03: qty 2

## 2016-08-03 MED ORDER — SODIUM CHLORIDE 0.9% FLUSH
10.0000 mL | INTRAVENOUS | Status: DC | PRN
  Filled 2016-08-03: qty 10

## 2016-08-03 MED ORDER — INFLUENZA VAC SPLIT QUAD 0.5 ML IM SUSY
0.5000 mL | PREFILLED_SYRINGE | Freq: Once | INTRAMUSCULAR | Status: AC
  Administered 2016-08-03: 0.5 mL via INTRAMUSCULAR
  Filled 2016-08-03: qty 0.5

## 2016-08-03 MED ORDER — SODIUM CHLORIDE 0.9 % IV SOLN: 375.0000 mg/m2 | Freq: Once | INTRAVENOUS | Status: DC

## 2016-08-03 MED ORDER — SODIUM CHLORIDE 0.9 % IV SOLN
Freq: Once | INTRAVENOUS | Status: AC
  Administered 2016-08-03: 10:00:00 via INTRAVENOUS
  Filled 2016-08-03: qty 1000

## 2016-08-03 NOTE — Progress Notes (Signed)
[facility] OFF[facility] PROGRESS NOTE  Patient Care Team: [name], MD as PCP - General (Family Medicine) Seeplaputhur [name], MD as Consulting Physician (General Surgery) Provider Not In System  No matching staging information was found for the patient.   Oncology History   41.  82yo- year-old lady presented with large left neck mass and abdominal palpable mass.  Biopsy of left neck mass is being positive for diffuse B large cell lymphoma; Clinically staged as stage III (June, 2016) 2.  Patient did not want any chemotherapy so was started on prednisone followed by Rituxan in June of 2016 with excellent response 3.  Patient has finished 4 weekly cycle of rituximab on April 16, 2015 with excellent response  4. Because of bulky disease in the past patient is getting maintenance Rituxan therapy started in September of 2016.  # R LE DVT- was found to have a deep vein thrombosis on xeralto. (March, 2017); And right-sided pulmonary emboli  # AUG 2017- PET- NED.       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  female pleasant patient above history of Diffuse large B-cell lymphoma on Rituxan prednisone is here for follow-up/ Proceed with Rituxan.  No new lumps or bumps. Complains of chronic swelling of the legs. Patient denies any weight loss. Denies night sweats. Denies any new lumps or bumps. Appetite is good. No nausea no vomiting. No chest pain or shortness of breath or cough.  REVIEW OF SYSTEMS:  A complete 10 point review of system is done which is negative except  mentioned above/history of present illness.   PAST MEDICAL HISTORY :  Past Medical History:  Diagnosis Date  . Cancer [facility]) 2011   gallbladder  . Chickenpox   . Diffuse large B cell lymphoma ([place]) 03/17/2015  . Elevated blood pressure   . Glaucoma   . History of blood transfusion     PAST SURGICAL HISTORY :   Past Surgical History:  Procedure Laterality Date  . ABDOMINAL HYSTERECTOMY  1978   For fibroids  . BREAST BIOPSY Right 1990  . TRANSURETHRAL RESECTION OF BLADDER TUMOR  2011     FAMILY HISTORY :   Family History  Problem Relation Age of Onset  . Alcoholism    . Cancer - Lung      Parent  . Cancer - Ovarian Mother   . Prostate cancer      Other relative  . Stroke      Other relative   . High blood pressure      Other relative  . Diabetes      Other relative    SOCIAL HISTORY:   Social History  Substance Use Topics  . Smoking status: Never Smoker  . Smokeless tobacco: Never Used  . Alcohol use No    ALLERGIES:  is allergic to iron and codeine.  MEDICATIONS:  Current Outpatient Prescriptions  Medication Sig Dispense Refill  . brimonidine (ALPHAGAN) 0.2 % ophthalmic solution     .  latanoprost (XALATAN) 0.005 % ophthalmic solution     . rivaroxaban (XARELTO) 20 MG TABS tablet Take 1 tablet (20 mg total) by mouth daily. 90 tablet 2  . timolol (TIMOPTIC) 0.5 % ophthalmic solution     . fentaNYL (DURAGESIC - DOSED MCG/HR) 12 MCG/HR Place 1 patch (12.5 mcg total) onto the skin every 3 (three) days. (Patient not taking: Reported on 08/03/2016) 10 patch 0  . HYDROcodone-acetaminophen (NORCO/VICODIN) 5-325 MG tablet 0.5 - 1 tab 4 times a day as needed for pain (Patient not taking: Reported on 08/03/2016) 60 tablet 0   No current facility-administered medications for this visit.     PHYSICAL EXAMINATION: ECOG PERFORMANCE STATUS: 2 - Symptomatic, <50% confined to bed  BP (!) 98/58 (BP Location: Left Arm, Patient Position: Sitting)   Pulse 78   Temp 97.5 F  (36.4 C) (Tympanic)   Resp 18   Wt 121 lb 14.6 oz (55.3 kg)   SpO2 99%   BMI 19.68 kg/m   Filed Weights   08/03/16 1019  Weight: 121 lb 14.6 oz (55.3 kg)    GENERAL: [name]  Elderly Caucasian female patient accompanied by daughter. She is able to sit on the exam table with great difficulty. Alert, no distress and comfortable.   She is walking with a walker. EYES: no pallor or icterus OROPHARYNX: no thrush or ulceration; good dentition  NECK: supple, no masses felt LYMPH:  no palpable lymphadenopathy in the cervical, axillary or inguinal regions LUNGS: clear to auscultation and  No wheeze or crackles HEART/CVS: regular rate & rhythm and no murmurs; No lower extremity edema ABDOMEN:abdomen soft, non-tender and normal bowel sounds Musculoskeletal:no cyanosis of digits and no clubbing  PSYCH: alert & oriented x 3 with fluent speech NEURO: no focal motor/sensory deficits SKIN:  no rashes or significant lesions  LABORATORY DATA:  I have reviewed the data as listed    Component Value Date/Time   NA 141 08/03/2016 0855   K 4.0 08/03/2016 0855   CL 107 08/03/2016 0855   CO2 28 08/03/2016 0855   GLUCOSE 135 (H) 08/03/2016 0855   BUN 32 (H) 08/03/2016 0855   CREATININE 1.05 (H) 08/03/2016 0855   CALCIUM 9.3 08/03/2016 0855   PROT 6.6 08/03/2016 0855   ALBUMIN 3.8 08/03/2016 0855   AST 18 08/03/2016 0855   ALT 11 (L) 08/03/2016 0855   ALKPHOS 95 08/03/2016 0855   BILITOT 0.7 08/03/2016 0855   GFRNONAA 45 (L) 08/03/2016 0855   GFRAA 52 (L) 08/03/2016 0855    No results found for: SPEP, UPEP  Lab Results  Component Value Date   WBC 4.3 08/03/2016   NEUTROABS 2.5 08/03/2016   HGB 11.7 (L) 08/03/2016   HCT 34.6 (L) 08/03/2016   MCV 87.5 08/03/2016   PLT 198 08/03/2016      Chemistry      Component Value Date/Time   NA 141 08/03/2016 0855   K 4.0 08/03/2016 0855   CL 107 08/03/2016 0855   CO2 28 08/03/2016 0855   BUN 32 (H) 08/03/2016 0855   CREATININE 1.05 (H)  08/03/2016 0855      Component Value Date/Time   CALCIUM 9.3 08/03/2016 0855   ALKPHOS 95 08/03/2016 0855   AST 18 08/03/2016 0855   ALT 11 (L) 08/03/2016 0855   BILITOT 0.7 08/03/2016 0855       RADIOGRAPHIC STUDIES: I have personally reviewed the radiological images as listed and agreed with the findings in the report. No results found.  ASSESSMENT & PLAN:  Diffuse large B cell lymphoma (HCC) Diffuse large B-cell lymphoma on Rituxan every 2 months/and prednisone. Clinically no evidence of progression noted. PET- AUG 16th 2017- NED.   # Continue with Proceed with Rituxan today; continue rituxan every 2 months  # History of diabetes glaucoma- off prednisone.   # RIght LE DVT/ swelling- recommend elevation/ stocking- History of PE- on Xarelto stable. New script given for 90 days.  # Okay with flu shot today.   Follow up in 2 months/labs/rituxan infusions.   # 25 minutes face-to-face with the patient discussing the above plan of care; more than 50% of time spent on prognosis/ natural history; counseling and coordination.  Orders Placed This Encounter  Procedures  . CBC with Differential    Standing Status:   Future    Standing Expiration Date:   10/02/2017  . Comprehensive metabolic panel    Standing Status:   Future    Standing Expiration Date:   10/02/2017  . Lactate dehydrogenase    Standing Status:   Future    Standing Expiration Date:   10/02/2017   All questions were answered. The patient knows to call the clinic with any problems, questions or concerns.      Cammie Sickle, MD 08/06/2016 5:39 PM

## 2016-08-03 NOTE — Progress Notes (Signed)
Patient is here for follow up, no complaints  

## 2016-08-03 NOTE — Assessment & Plan Note (Signed)
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 Clinically no evidence of progression noted. PET- AUG 16th 2017- NED.   # Continue with Proceed with Rituxan today; continue rituxan every 2 months  # History of diabetes glaucoma- off prednisone.   # RIght LE DVT/ swelling- recommend elevation/ stocking- History of PE- on Xarelto stable. New script given for 90 days.  # Okay with flu shot today.   Follow up in 2 months/labs/rituxan infusions.

## 2016-08-13 ENCOUNTER — Ambulatory Visit (INDEPENDENT_AMBULATORY_CARE_PROVIDER_SITE_OTHER): Payer: PPO | Admitting: Family Medicine

## 2016-08-13 ENCOUNTER — Encounter: Payer: Self-pay | Admitting: Family Medicine

## 2016-08-13 DIAGNOSIS — G629 Polyneuropathy, unspecified: Secondary | ICD-10-CM | POA: Diagnosis not present

## 2016-08-13 DIAGNOSIS — I2699 Other pulmonary embolism without acute cor pulmonale: Secondary | ICD-10-CM | POA: Diagnosis not present

## 2016-08-13 DIAGNOSIS — R600 Localized edema: Secondary | ICD-10-CM | POA: Diagnosis not present

## 2016-08-13 NOTE — Assessment & Plan Note (Signed)
Continues to have symptoms. Benign exam today. She'll continue to monitor. If worsens she'll let us know.

## 2016-08-13 NOTE — Progress Notes (Signed)
Pre visit review using our clinic review tool, if applicable. No additional management support is needed unless otherwise documented below in the visit note. 

## 2016-08-13 NOTE — Progress Notes (Signed)
  Tommi Rumps, MD Phone: [phone]  Lisa Sosa is a 82 y.o. female who presents today for follow-up.  Neuropathy: Patient notes this is stable. Still with some burning and tingling in her bilateral feet. Notes it is not enough to bother her. No skin lesions on her feet. Not on any medications for this. She desires no medications for this.  PE: Currently on Xarelto. No issues with bleeding. No chest pain or shortness of breath. No unilateral leg swelling.  Venous insufficiency: Bilateral lower extremity edema has persisted. It goes down at night. No orthopnea or PND. Prior lab work unremarkable.  PMH: nonsmoker.   ROS see history of present illness  Objective  Physical Exam Vitals:   08/13/16 1406  BP: 104/62  Pulse: 71  Temp: 97.8 F (36.6 C)    BP Readings from Last 3 Encounters:  08/13/16 104/62  08/03/16 (!) 98/58  06/03/16 (!) 95/58   Wt Readings from Last 3 Encounters:  08/13/16 118 lb 12.8 oz (53.9 kg)  08/03/16 121 lb 14.6 oz (55.3 kg)  06/03/16 120 lb 14.4 oz (54.8 kg)    Physical Exam  Constitutional: No distress.  Cardiovascular: Normal rate, regular rhythm and normal heart sounds.   Pulmonary/Chest: Effort normal and breath sounds normal.  Musculoskeletal: She exhibits no edema.  Neurological: She is alert. Gait normal.  Bilateral feet with sensation light touch intact, 2+ DP pulses bilaterally  Skin: Skin is warm and dry. She is not diaphoretic.     Assessment/Plan: Please see individual problem list.  Pulmonary embolus ([place]) Asymptomatic. Tolerating Xarelto with no bleeding. She'll continue Xarelto. Given return precautions.  Neuropathy ([place]) Continues to have symptoms. Benign exam today. She'll continue to monitor. If worsens she'll let us know.  Bilateral lower extremity edema Suspect venous insufficiency as cause. Prior lab work unremarkable. No CHF symptoms. She'll keep her legs propped up. Consider compression stockings in the  future if she desires.   Tommi Rumps, MD [place]

## 2016-08-13 NOTE — Assessment & Plan Note (Signed)
Asymptomatic. Tolerating Xarelto with no bleeding. She'll continue Xarelto. Given return precautions.

## 2016-08-13 NOTE — Assessment & Plan Note (Signed)
Suspect venous insufficiency as cause. Prior lab work unremarkable. No CHF symptoms. She'll keep her legs propped up. Consider compression stockings in the future if she desires.

## 2016-08-13 NOTE — Patient Instructions (Signed)
Nice to see you. You are doing great. Please start to exercise some. Please monitor your feet and if your neuropathy gets worse please let us know. Please keep your legs elevated as much as possible. If you develop chest pain, shortness of breath, bleeding, swelling in one leg greater than the other, or any new or changing symptoms please seek medical attention.

## 2016-08-28 DIAGNOSIS — H401112 Primary open-angle glaucoma, right eye, moderate stage: Secondary | ICD-10-CM | POA: Diagnosis not present

## 2016-10-02 ENCOUNTER — Inpatient Hospital Stay: Payer: PPO | Attending: Oncology

## 2016-10-02 ENCOUNTER — Inpatient Hospital Stay: Payer: PPO

## 2016-10-02 ENCOUNTER — Inpatient Hospital Stay (HOSPITAL_BASED_OUTPATIENT_CLINIC_OR_DEPARTMENT_OTHER): Payer: PPO | Admitting: Oncology

## 2016-10-02 VITALS — BP 132/69 | HR 83 | Temp 95.8°F | Resp 18 | Wt 119.3 lb

## 2016-10-02 DIAGNOSIS — C8339 Diffuse large B-cell lymphoma, extranodal and solid organ sites: Secondary | ICD-10-CM

## 2016-10-02 DIAGNOSIS — Z801 Family history of malignant neoplasm of trachea, bronchus and lung: Secondary | ICD-10-CM | POA: Insufficient documentation

## 2016-10-02 DIAGNOSIS — Z8041 Family history of malignant neoplasm of ovary: Secondary | ICD-10-CM | POA: Insufficient documentation

## 2016-10-02 DIAGNOSIS — E119 Type 2 diabetes mellitus without complications: Secondary | ICD-10-CM | POA: Diagnosis not present

## 2016-10-02 DIAGNOSIS — Z79899 Other long term (current) drug therapy: Secondary | ICD-10-CM | POA: Insufficient documentation

## 2016-10-02 DIAGNOSIS — I10 Essential (primary) hypertension: Secondary | ICD-10-CM

## 2016-10-02 DIAGNOSIS — C83398 Diffuse large b-cell lymphoma of other extranodal and solid organ sites: Secondary | ICD-10-CM

## 2016-10-02 DIAGNOSIS — C8330 Diffuse large B-cell lymphoma, unspecified site: Secondary | ICD-10-CM | POA: Insufficient documentation

## 2016-10-02 DIAGNOSIS — Z7901 Long term (current) use of anticoagulants: Secondary | ICD-10-CM

## 2016-10-02 DIAGNOSIS — M542 Cervicalgia: Secondary | ICD-10-CM | POA: Diagnosis not present

## 2016-10-02 DIAGNOSIS — Z86718 Personal history of other venous thrombosis and embolism: Secondary | ICD-10-CM

## 2016-10-02 DIAGNOSIS — Z8042 Family history of malignant neoplasm of prostate: Secondary | ICD-10-CM

## 2016-10-02 DIAGNOSIS — Z800 Family history of malignant neoplasm of digestive organs: Secondary | ICD-10-CM | POA: Diagnosis not present

## 2016-10-02 DIAGNOSIS — H409 Unspecified glaucoma: Secondary | ICD-10-CM | POA: Insufficient documentation

## 2016-10-02 DIAGNOSIS — C8333 Diffuse large B-cell lymphoma, intra-abdominal lymph nodes: Secondary | ICD-10-CM

## 2016-10-02 LAB — CBC WITH DIFFERENTIAL/PLATELET
Basophils Absolute: 0 10*3/uL (ref 0–0.1)
Basophils Relative: 1 %
EOS ABS: 0.1 10*3/uL (ref 0–0.7)
Eosinophils Relative: 4 %
HEMATOCRIT: 34.6 % — AB (ref 35.0–47.0)
HEMOGLOBIN: 11.5 g/dL — AB (ref 12.0–16.0)
LYMPHS ABS: 1 10*3/uL (ref 1.0–3.6)
LYMPHS PCT: 32 %
MCH: 28.8 pg (ref 26.0–34.0)
MCHC: 33.2 g/dL (ref 32.0–36.0)
MCV: 86.9 fL (ref 80.0–100.0)
MONOS PCT: 11 %
Monocytes Absolute: 0.3 10*3/uL (ref 0.2–0.9)
NEUTROS PCT: 52 %
Neutro Abs: 1.6 10*3/uL (ref 1.4–6.5)
Platelets: 164 10*3/uL (ref 150–440)
RBC: 3.98 MIL/uL (ref 3.80–5.20)
RDW: 15.5 % — ABNORMAL HIGH (ref 11.5–14.5)
WBC: 3.1 10*3/uL — AB (ref 3.6–11.0)

## 2016-10-02 LAB — COMPREHENSIVE METABOLIC PANEL
ALK PHOS: 89 U/L (ref 38–126)
ALT: 10 U/L — AB (ref 14–54)
ANION GAP: 4 — AB (ref 5–15)
AST: 19 U/L (ref 15–41)
Albumin: 3.8 g/dL (ref 3.5–5.0)
BILIRUBIN TOTAL: 0.7 mg/dL (ref 0.3–1.2)
BUN: 29 mg/dL — ABNORMAL HIGH (ref 6–20)
CALCIUM: 9.2 mg/dL (ref 8.9–10.3)
CO2: 29 mmol/L (ref 22–32)
CREATININE: 0.95 mg/dL (ref 0.44–1.00)
Chloride: 107 mmol/L (ref 101–111)
GFR, EST AFRICAN AMERICAN: 59 mL/min — AB (ref >60)
GFR, EST NON AFRICAN AMERICAN: 51 mL/min — AB (ref >60)
Glucose, Bld: 94 mg/dL (ref 65–99)
Potassium: 3.4 mmol/L — ABNORMAL LOW (ref 3.5–5.1)
Sodium: 140 mmol/L (ref 135–145)
TOTAL PROTEIN: 6.6 g/dL (ref 6.5–8.1)

## 2016-10-02 LAB — LACTATE DEHYDROGENASE: LDH: 132 U/L (ref 98–192)

## 2016-10-02 MED ORDER — SODIUM CHLORIDE 0.9 % IV SOLN
375.0000 mg/m2 | Freq: Once | INTRAVENOUS | Status: AC
  Administered 2016-10-02: 600 mg via INTRAVENOUS
  Filled 2016-10-02: qty 50

## 2016-10-02 MED ORDER — ACETAMINOPHEN 325 MG PO TABS
650.0000 mg | ORAL_TABLET | Freq: Once | ORAL | Status: AC
  Administered 2016-10-02: 650 mg via ORAL
  Filled 2016-10-02: qty 2

## 2016-10-02 MED ORDER — DIPHENHYDRAMINE HCL 25 MG PO CAPS
50.0000 mg | ORAL_CAPSULE | Freq: Once | ORAL | Status: AC
  Administered 2016-10-02: 50 mg via ORAL
  Filled 2016-10-02: qty 2

## 2016-10-02 MED ORDER — SODIUM CHLORIDE 0.9 % IV SOLN
Freq: Once | INTRAVENOUS | Status: AC
  Administered 2016-10-02: 10:00:00 via INTRAVENOUS
  Filled 2016-10-02: qty 1000

## 2016-10-02 MED ORDER — SODIUM CHLORIDE 0.9 % IV SOLN: 375.0000 mg/m2 | Freq: Once | INTRAVENOUS | Status: DC

## 2016-10-02 NOTE — Progress Notes (Signed)
Complains of neck pain x 2 weeks. Pain is relieved with vicodin.

## 2016-10-07 NOTE — Progress Notes (Signed)
Lisa Sosa  [phone] [phone]) [phone]  ID: Lisa Sosa OB: May 06, 1923  MR#: [id number]  [id number]  Patient Care Team: [name], MD as PCP - General (Family Medicine) Seeplaputhur Lisa Haines, MD as Consulting Physician (General Surgery) Provider Not In System  CHIEF COMPLAINT: Diffuse large B-cell lymphoma  INTERVAL HISTORY: Patient is a 82 year old female who presents to clinic for routine evaluation and continuation of Rituxan maintenance. Her only complaint today is of persistent neck pain for last several weeks which is relieved with Vicodin. She otherwise feels well. She has no neurologic complaints. She denies any recent fevers, illnesses, night sweats, or weight loss. She has no chest pain or shortness of breath. She denies any nausea, vomiting, constipation, or diarrhea. She has no urinary complaints. Patient otherwise feels well and offers no further specific complaints.  REVIEW OF SYSTEMS:   Review of Systems  Constitutional: Negative.  Negative for fever, malaise/fatigue and weight loss.  Respiratory: Negative.  Negative for cough.   Cardiovascular: Negative.  Negative for chest pain and leg swelling.  Gastrointestinal: Negative.  Negative for abdominal pain.  Genitourinary: Negative.   Musculoskeletal: Positive for neck pain.  Neurological: Negative.   Psychiatric/Behavioral: Negative.  The patient is not nervous/anxious.     As per HPI. Otherwise, a complete review of systems is negative.  PAST MEDICAL HISTORY: Past Medical History:  Diagnosis Date  . Cancer [facility]) 2011   gallbladder  . Chickenpox   . Diffuse large B cell lymphoma ([place]) 03/17/2015  . Elevated blood pressure   . Glaucoma   . History of blood transfusion     PAST SURGICAL HISTORY: Past Surgical History:  Procedure Laterality Date  . ABDOMINAL HYSTERECTOMY  1978   For fibroids  . BREAST BIOPSY Right 1990  . TRANSURETHRAL RESECTION OF BLADDER  TUMOR  2011     FAMILY HISTORY: Family History  Problem Relation Age of Onset  . Alcoholism    . Cancer - Lung      Parent  . Cancer - Ovarian Mother   . Prostate cancer      Other relative  . Stroke      Other relative   . High blood pressure      Other relative  . Diabetes      Other relative    ADVANCED DIRECTIVES (Y/N):  N  HEALTH MAINTENANCE: Social History  Substance Use Topics  . Smoking status: Never Smoker  . Smokeless tobacco: Never Used  . Alcohol use No     Colonoscopy:  PAP:  Bone density:  Lipid panel:  Allergies  Allergen Reactions  . Iron Nausea And Vomiting  . Codeine Nausea Only    Current Outpatient Prescriptions  Medication Sig Dispense Refill  . brimonidine (ALPHAGAN) 0.2 % ophthalmic solution     . HYDROcodone-acetaminophen (NORCO/VICODIN) 5-325 MG tablet 0.5 - 1 tab 4 times a day as needed for pain 60 tablet 0  . latanoprost (XALATAN) 0.005 % ophthalmic solution     . rivaroxaban (XARELTO) 20 MG TABS tablet Take 1 tablet (20 mg total) by mouth daily. 90 tablet 2  . timolol (TIMOPTIC) 0.5 % ophthalmic solution      No current facility-administered medications for this visit.     OBJECTIVE: Vitals:   10/02/16 0920  BP: 132/69  Pulse: 83  Resp: 18  Temp: (!) 95.8 F (35.4 C)     Body mass index is 19.25 kg/m.    ECOG FS:1 - Symptomatic but  completely ambulatory  General: Well-developed, well-nourished, no acute distress. Eyes: Pink conjunctiva, anicteric sclera. HEENT: Normocephalic, moist mucous membranes, clear oropharnyx. Lungs: Clear to auscultation bilaterally. Heart: Regular rate and rhythm. No rubs, murmurs, or gallops. Abdomen: Soft, nontender, nondistended. No organomegaly noted, normoactive bowel sounds. Musculoskeletal: No edema, cyanosis, or clubbing. Neuro: Alert, answering all questions appropriately. Cranial nerves grossly intact. Skin: No rashes or petechiae noted. Psych: Normal affect. Lymphatics: No  cervical, calvicular, axillary or inguinal LAD.   LAB RESULTS:  Lab Results  Component Value Date   NA 140 10/02/2016   K 3.4 (L) 10/02/2016   CL 107 10/02/2016   CO2 29 10/02/2016   GLUCOSE 94 10/02/2016   BUN 29 (H) 10/02/2016   CREATININE 0.95 10/02/2016   CALCIUM 9.2 10/02/2016   PROT 6.6 10/02/2016   ALBUMIN 3.8 10/02/2016   AST 19 10/02/2016   ALT 10 (L) 10/02/2016   ALKPHOS 89 10/02/2016   BILITOT 0.7 10/02/2016   GFRNONAA 51 (L) 10/02/2016   GFRAA 59 (L) 10/02/2016    Lab Results  Component Value Date   WBC 3.1 (L) 10/02/2016   NEUTROABS 1.6 10/02/2016   HGB 11.5 (L) 10/02/2016   HCT 34.6 (L) 10/02/2016   MCV 86.9 10/02/2016   PLT 164 10/02/2016     STUDIES: No results found.  ASSESSMENT: Diffuse large B-cell lymphoma  PLAN:    Diffuse large B-cell lymphoma on Rituxan every 2 months/and prednisone. Clinically no evidence of progression noted. PET- AUG 16th 2017- NED.   # Continue with Proceed with Rituxan today; continue rituxan every 2 months  # History of diabetes glaucoma- off prednisone.   # RIght LE DVT/ swelling- recommend elevation/ stocking- History of PE- on Xarelto stable.   # Neck pain: Likely musculoskeletal in nature. Continue Vicodin as prescribed.   Follow up in 2 months/labs/rituxan infusions. 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 Clinically no evidence of progression noted. PET- AUG 16th 2017- NED.    Patient expressed understanding and was in agreement with this plan. She also understands that She can call clinic at any time with any questions, concerns, or complaints.    [name], MD   10/07/2016 8:32 AM

## 2016-12-01 ENCOUNTER — Other Ambulatory Visit: Payer: Self-pay | Admitting: *Deleted

## 2016-12-01 DIAGNOSIS — C672 Malignant neoplasm of lateral wall of bladder: Secondary | ICD-10-CM

## 2016-12-01 DIAGNOSIS — M542 Cervicalgia: Secondary | ICD-10-CM | POA: Diagnosis not present

## 2016-12-01 DIAGNOSIS — M6530 Trigger finger, unspecified finger: Secondary | ICD-10-CM | POA: Insufficient documentation

## 2016-12-01 DIAGNOSIS — M65312 Trigger thumb, left thumb: Secondary | ICD-10-CM | POA: Diagnosis not present

## 2016-12-03 ENCOUNTER — Inpatient Hospital Stay: Payer: PPO

## 2016-12-03 ENCOUNTER — Inpatient Hospital Stay: Payer: PPO | Attending: Internal Medicine | Admitting: Internal Medicine

## 2016-12-03 DIAGNOSIS — Z8041 Family history of malignant neoplasm of ovary: Secondary | ICD-10-CM | POA: Diagnosis not present

## 2016-12-03 DIAGNOSIS — C8581 Other specified types of non-Hodgkin lymphoma, lymph nodes of head, face, and neck: Secondary | ICD-10-CM | POA: Insufficient documentation

## 2016-12-03 DIAGNOSIS — C8330 Diffuse large B-cell lymphoma, unspecified site: Secondary | ICD-10-CM | POA: Insufficient documentation

## 2016-12-03 DIAGNOSIS — Z79899 Other long term (current) drug therapy: Secondary | ICD-10-CM

## 2016-12-03 DIAGNOSIS — C8333 Diffuse large B-cell lymphoma, intra-abdominal lymph nodes: Secondary | ICD-10-CM

## 2016-12-03 DIAGNOSIS — Z7901 Long term (current) use of anticoagulants: Secondary | ICD-10-CM | POA: Insufficient documentation

## 2016-12-03 DIAGNOSIS — I10 Essential (primary) hypertension: Secondary | ICD-10-CM | POA: Diagnosis not present

## 2016-12-03 DIAGNOSIS — Z8551 Personal history of malignant neoplasm of bladder: Secondary | ICD-10-CM | POA: Insufficient documentation

## 2016-12-03 DIAGNOSIS — Z86718 Personal history of other venous thrombosis and embolism: Secondary | ICD-10-CM | POA: Insufficient documentation

## 2016-12-03 DIAGNOSIS — Z5112 Encounter for antineoplastic immunotherapy: Secondary | ICD-10-CM | POA: Diagnosis not present

## 2016-12-03 DIAGNOSIS — Z801 Family history of malignant neoplasm of trachea, bronchus and lung: Secondary | ICD-10-CM | POA: Insufficient documentation

## 2016-12-03 DIAGNOSIS — E119 Type 2 diabetes mellitus without complications: Secondary | ICD-10-CM | POA: Insufficient documentation

## 2016-12-03 DIAGNOSIS — Z8042 Family history of malignant neoplasm of prostate: Secondary | ICD-10-CM | POA: Insufficient documentation

## 2016-12-03 DIAGNOSIS — C672 Malignant neoplasm of lateral wall of bladder: Secondary | ICD-10-CM

## 2016-12-03 LAB — CBC WITH DIFFERENTIAL/PLATELET
Basophils Absolute: 0 10*3/uL (ref 0–0.1)
Basophils Relative: 1 %
Eosinophils Absolute: 0.1 10*3/uL (ref 0–0.7)
Eosinophils Relative: 2 %
HCT: 35.9 % (ref 35.0–47.0)
Hemoglobin: 12 g/dL (ref 12.0–16.0)
LYMPHS ABS: 1.3 10*3/uL (ref 1.0–3.6)
LYMPHS PCT: 24 %
MCH: 29.6 pg (ref 26.0–34.0)
MCHC: 33.4 g/dL (ref 32.0–36.0)
MCV: 88.5 fL (ref 80.0–100.0)
MONO ABS: 0.5 10*3/uL (ref 0.2–0.9)
Monocytes Relative: 9 %
Neutro Abs: 3.6 10*3/uL (ref 1.4–6.5)
Neutrophils Relative %: 64 %
PLATELETS: 177 10*3/uL (ref 150–440)
RBC: 4.06 MIL/uL (ref 3.80–5.20)
RDW: 15.6 % — AB (ref 11.5–14.5)
WBC: 5.6 10*3/uL (ref 3.6–11.0)

## 2016-12-03 LAB — COMPREHENSIVE METABOLIC PANEL
ALBUMIN: 4.2 g/dL (ref 3.5–5.0)
ALK PHOS: 91 U/L (ref 38–126)
ALT: 18 U/L (ref 14–54)
ANION GAP: 5 (ref 5–15)
AST: 25 U/L (ref 15–41)
BUN: 32 mg/dL — ABNORMAL HIGH (ref 6–20)
CALCIUM: 9.2 mg/dL (ref 8.9–10.3)
CO2: 29 mmol/L (ref 22–32)
CREATININE: 0.97 mg/dL (ref 0.44–1.00)
Chloride: 108 mmol/L (ref 101–111)
GFR calc Af Amer: 57 mL/min — ABNORMAL LOW (ref >60)
GFR calc non Af Amer: 50 mL/min — ABNORMAL LOW (ref >60)
GLUCOSE: 78 mg/dL (ref 65–99)
Potassium: 3.9 mmol/L (ref 3.5–5.1)
SODIUM: 142 mmol/L (ref 135–145)
Total Bilirubin: 0.8 mg/dL (ref 0.3–1.2)
Total Protein: 6.9 g/dL (ref 6.5–8.1)

## 2016-12-03 LAB — LACTATE DEHYDROGENASE: LDH: 149 U/L (ref 98–192)

## 2016-12-03 MED ORDER — SODIUM CHLORIDE 0.9 % IV SOLN
Freq: Once | INTRAVENOUS | Status: AC
  Administered 2016-12-03: 11:00:00 via INTRAVENOUS
  Filled 2016-12-03: qty 1000

## 2016-12-03 MED ORDER — SODIUM CHLORIDE 0.9 % IV SOLN
600.0000 mg | Freq: Once | INTRAVENOUS | Status: AC
  Administered 2016-12-03: 600 mg via INTRAVENOUS
  Filled 2016-12-03: qty 50

## 2016-12-03 MED ORDER — ACETAMINOPHEN 325 MG PO TABS
650.0000 mg | ORAL_TABLET | Freq: Once | ORAL | Status: AC
  Administered 2016-12-03: 650 mg via ORAL
  Filled 2016-12-03: qty 2

## 2016-12-03 MED ORDER — DIPHENHYDRAMINE HCL 25 MG PO CAPS
50.0000 mg | ORAL_CAPSULE | Freq: Once | ORAL | Status: AC
  Administered 2016-12-03: 50 mg via ORAL
  Filled 2016-12-03: qty 2

## 2016-12-03 MED ORDER — SODIUM CHLORIDE 0.9 % IV SOLN: 375.0000 mg/m2 | Freq: Once | INTRAVENOUS | Status: DC

## 2016-12-03 NOTE — Progress Notes (Signed)
[place] OFFICE PROGRESS NOTE  Patient Care Team: Lisa Haven, MD as PCP - General (Family Medicine) Seeplaputhur Lisa Haines, MD as Consulting Physician (General Surgery) Provider Not In System  No matching staging information was found for the patient.   Oncology History   81.  83yo- year-old lady presented with large left neck mass and abdominal palpable mass.  Biopsy of left neck mass is being positive for diffuse B large cell lymphoma; Clinically staged as stage III (June, 2016) 2.  Patient did not want any chemotherapy so was started on prednisone followed by Rituxan in June of 2016 with excellent response 3.  Patient has finished 4 weekly cycle of rituximab on April 16, 2015 with excellent response  4. Because of bulky disease in the past patient is getting maintenance Rituxan therapy started in September of 2016.  # R LE DVT- was found to have a deep vein thrombosis on xeralto. (March, 2017); And right-sided pulmonary emboli  # AUG 2017- PET- NED.       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  female pleasant patient above history of Diffuse large B-cell lymphoma on Rituxan prednisone is here for follow-up/ Proceed with Rituxan.   Denies any new lumps or bumps. Appetite is good. No nausea no vomiting. No chest pain or shortness of breath or cough. No infections or hospitalization. No new lumps or bumps. Complains of chronic swelling of the legs. Patient denies any weight loss. Denies night sweats. She  continues to walk with a rolling walker. No falls.Recently diagnosed with trigger finger of the left hand.  REVIEW OF SYSTEMS:  A complete 10 point review of system is done which is negative except mentioned above/history of present illness.   PAST MEDICAL HISTORY :  Past Medical History:  Diagnosis Date  . Cancer [facility]) 2011   gallbladder  . Chickenpox   . Diffuse large B cell lymphoma ([place]) 03/17/2015  . Elevated blood pressure   . Glaucoma   . History of blood transfusion     PAST SURGICAL HISTORY :   Past Surgical History:  Procedure Laterality Date  . ABDOMINAL HYSTERECTOMY  1978   For fibroids  . BREAST BIOPSY Right 1990  . TRANSURETHRAL RESECTION OF BLADDER TUMOR  2011     FAMILY HISTORY :   Family History  Problem Relation Age of Onset  . Alcoholism    . Cancer - Lung      Parent  . Cancer - Ovarian Mother   . Prostate cancer      Other relative  . Stroke      Other relative   . High blood pressure      Other relative  . Diabetes      Other relative    SOCIAL HISTORY:   Social History  Substance Use Topics  . Smoking status: Never Smoker  . Smokeless tobacco: Never Used  .  Alcohol use No    ALLERGIES:  is allergic to iron and codeine.  MEDICATIONS:  Current Outpatient Prescriptions  Medication Sig Dispense Refill  . brimonidine (ALPHAGAN) 0.2 % ophthalmic solution     . HYDROcodone-acetaminophen (NORCO/VICODIN) 5-325 MG tablet 0.5 - 1 tab 4 times a day as needed for pain 60 tablet 0  . latanoprost (XALATAN) 0.005 % ophthalmic solution     . methocarbamol (ROBAXIN) 500 MG tablet Take 500 mg by mouth.  1  . rivaroxaban (XARELTO) 20 MG TABS tablet Take 1 tablet (20 mg total) by mouth daily. 90 tablet 2  . timolol (TIMOPTIC) 0.5 % ophthalmic solution      No current facility-administered medications for this visit.     PHYSICAL EXAMINATION: ECOG PERFORMANCE STATUS: 2 - Symptomatic, <50% confined to bed  BP 119/60 (BP Location: Right Arm, Patient  Position: Sitting)   Pulse 65   Temp (!) 96.2 F (35.7 C) (Tympanic)   Wt 117 lb (53.1 kg)   BMI 18.88 kg/m   Filed Weights   12/03/16 0929  Weight: 117 lb (53.1 kg)    GENERAL: 83y  Elderly Caucasian female patient accompanied by daughter. She is able to sit on the exam table with great difficulty. Alert, no distress and comfortable.   She is walking with a walker. EYES: no pallor or icterus OROPHARYNX: no thrush or ulceration; good dentition  NECK: supple, no masses felt LYMPH:  no palpable lymphadenopathy in the cervical, axillary or inguinal regions LUNGS: clear to auscultation and  No wheeze or crackles HEART/CVS: regular rate & rhythm and no murmurs; No lower extremity edema ABDOMEN:abdomen soft, non-tender and normal bowel sounds Musculoskeletal:no cyanosis of digits and no clubbing  PSYCH: alert & oriented x 3 with fluent speech NEURO: no focal motor/sensory deficits SKIN:  no rashes or significant lesions  LABORATORY DATA:  I have reviewed the data as listed    Component Value Date/Time   NA 142 12/03/2016 0900   K 3.9 12/03/2016 0900   CL 108 12/03/2016 0900   CO2 29 12/03/2016 0900   GLUCOSE 78 12/03/2016 0900   BUN 32 (H) 12/03/2016 0900   CREATININE 0.97 12/03/2016 0900   CALCIUM 9.2 12/03/2016 0900   PROT 6.9 12/03/2016 0900   ALBUMIN 4.2 12/03/2016 0900   AST 25 12/03/2016 0900   ALT 18 12/03/2016 0900   ALKPHOS 91 12/03/2016 0900   BILITOT 0.8 12/03/2016 0900   GFRNONAA 50 (L) 12/03/2016 0900   GFRAA 57 (L) 12/03/2016 0900    No results found for: SPEP, UPEP  Lab Results  Component Value Date   WBC 5.6 12/03/2016   NEUTROABS 3.6 12/03/2016   HGB 12.0 12/03/2016   HCT 35.9 12/03/2016   MCV 88.5 12/03/2016   PLT 177 12/03/2016      Chemistry      Component Value Date/Time   NA 142 12/03/2016 0900   K 3.9 12/03/2016 0900   CL 108 12/03/2016 0900   CO2 29 12/03/2016 0900   BUN 32 (H) 12/03/2016 0900   CREATININE 0.97 12/03/2016 0900       Component Value Date/Time   CALCIUM 9.2 12/03/2016 0900   ALKPHOS 91 12/03/2016 0900   AST 25 12/03/2016 0900   ALT 18 12/03/2016 0900   BILITOT 0.8 12/03/2016 0900       RADIOGRAPHIC STUDIES: I have personally reviewed the radiological images as listed and agreed with the findings in the report. No results found.   ASSESSMENT & PLAN:  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) Diffuse large B-cell lymphoma on Rituxan every 2 months/and prednisone. Clinically no evidence of progression noted. PET- AUG 16th 2017- NED.   # Continue with Proceed with Rituxan today; continue rituxan every 2 months. Tolerating without any major side effects. Clinically no evidence of progression/recurrence.   # neck pain/ trigger finger- improved.   # History of diabetes glaucoma- off prednisone.   # RIght LE DVT/ swelling- recommend elevation/ stocking- History of PE- on Xarelto stable.   Follow up in 2 months/labs/rituxan infusions; PET scan prior.  # 25 minutes face-to-face with the patient discussing the above plan of care; more than 50% of time spent on prognosis/ natural history; counseling and coordination.  Orders Placed This Encounter  Procedures  . NM PET Image Restag (PS) Skull Base To Thigh    Standing Status:   Future    Standing Expiration Date:   02/02/2018    Order Specific Question:   Reason for Exam (SYMPTOM  OR DIAGNOSIS REQUIRED)    Answer:   lymphoma    Order Specific Question:   Preferred imaging location?    Answer:   [facility]  . CBC with Differential    Standing Status:   Future    Standing Expiration Date:   12/03/2017  . Comprehensive metabolic panel    Standing Status:   Future    Standing Expiration Date:   12/03/2017  . Lactate dehydrogenase    Standing Status:   Future    Standing Expiration Date:   12/03/2017   All questions were answered. The patient knows to call the clinic with any problems, questions or concerns.      Cammie Sickle, MD 12/04/2016 1:38 PM

## 2016-12-03 NOTE — Assessment & Plan Note (Addendum)
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 Clinically no evidence of progression noted. PET- AUG 16th 2017- NED.   # Continue with Proceed with Rituxan today; continue rituxan every 2 months. Tolerating without any major side effects. Clinically no evidence of progression/recurrence.   # neck pain/ trigger finger- improved.   # History of diabetes glaucoma- off prednisone.   # RIght LE DVT/ swelling- recommend elevation/ stocking- History of PE- on Xarelto stable.   Follow up in 2 months/labs/rituxan infusions; PET scan prior.

## 2016-12-03 NOTE — Progress Notes (Signed)
Patient here today for follow up.  Patient states no new concerns today  

## 2016-12-22 ENCOUNTER — Telehealth: Payer: Self-pay | Admitting: Family Medicine

## 2016-12-22 NOTE — Telephone Encounter (Signed)
Left pt message asking to call Allison back directly at [phone] to schedule AWV. Thanks! °

## 2017-01-21 DIAGNOSIS — M47812 Spondylosis without myelopathy or radiculopathy, cervical region: Secondary | ICD-10-CM | POA: Diagnosis not present

## 2017-01-25 ENCOUNTER — Ambulatory Visit
Admission: RE | Admit: 2017-01-25 | Discharge: 2017-01-25 | Disposition: A | Discharge status: Home / Self Care | Payer: PPO | Source: Ambulatory Visit | Attending: Internal Medicine | Admitting: Internal Medicine

## 2017-01-25 DIAGNOSIS — C8581 Other specified types of non-Hodgkin lymphoma, lymph nodes of head, face, and neck: Secondary | ICD-10-CM

## 2017-01-25 DIAGNOSIS — C8591 Non-Hodgkin lymphoma, unspecified, lymph nodes of head, face, and neck: Secondary | ICD-10-CM | POA: Diagnosis not present

## 2017-01-25 DIAGNOSIS — J984 Other disorders of lung: Secondary | ICD-10-CM | POA: Diagnosis not present

## 2017-01-25 DIAGNOSIS — C8330 Diffuse large B-cell lymphoma, unspecified site: Secondary | ICD-10-CM | POA: Diagnosis not present

## 2017-01-25 LAB — GLUCOSE, CAPILLARY: Glucose-Capillary: 88 mg/dL (ref 65–99)

## 2017-01-25 MED ORDER — FLUDEOXYGLUCOSE F - 18 (FDG) INJECTION
12.0000 | Freq: Once | INTRAVENOUS | Status: AC | PRN
  Administered 2017-01-25: 13.03 via INTRAVENOUS

## 2017-01-28 ENCOUNTER — Inpatient Hospital Stay (HOSPITAL_BASED_OUTPATIENT_CLINIC_OR_DEPARTMENT_OTHER): Payer: PPO | Admitting: Internal Medicine

## 2017-01-28 ENCOUNTER — Inpatient Hospital Stay: Payer: PPO

## 2017-01-28 ENCOUNTER — Inpatient Hospital Stay: Payer: PPO | Attending: Internal Medicine

## 2017-01-28 VITALS — BP 124/65 | HR 79 | Temp 96.4°F | Wt 113.4 lb

## 2017-01-28 DIAGNOSIS — E119 Type 2 diabetes mellitus without complications: Secondary | ICD-10-CM | POA: Insufficient documentation

## 2017-01-28 DIAGNOSIS — C8330 Diffuse large B-cell lymphoma, unspecified site: Secondary | ICD-10-CM | POA: Insufficient documentation

## 2017-01-28 DIAGNOSIS — Z8042 Family history of malignant neoplasm of prostate: Secondary | ICD-10-CM | POA: Diagnosis not present

## 2017-01-28 DIAGNOSIS — Z7901 Long term (current) use of anticoagulants: Secondary | ICD-10-CM | POA: Diagnosis not present

## 2017-01-28 DIAGNOSIS — Z86718 Personal history of other venous thrombosis and embolism: Secondary | ICD-10-CM

## 2017-01-28 DIAGNOSIS — Z79899 Other long term (current) drug therapy: Secondary | ICD-10-CM

## 2017-01-28 DIAGNOSIS — C83398 Diffuse large b-cell lymphoma of other extranodal and solid organ sites: Secondary | ICD-10-CM

## 2017-01-28 DIAGNOSIS — Z8551 Personal history of malignant neoplasm of bladder: Secondary | ICD-10-CM | POA: Diagnosis not present

## 2017-01-28 DIAGNOSIS — C8581 Other specified types of non-Hodgkin lymphoma, lymph nodes of head, face, and neck: Secondary | ICD-10-CM

## 2017-01-28 DIAGNOSIS — Z8041 Family history of malignant neoplasm of ovary: Secondary | ICD-10-CM | POA: Diagnosis not present

## 2017-01-28 DIAGNOSIS — C8339 Diffuse large B-cell lymphoma, extranodal and solid organ sites: Secondary | ICD-10-CM

## 2017-01-28 DIAGNOSIS — Z801 Family history of malignant neoplasm of trachea, bronchus and lung: Secondary | ICD-10-CM

## 2017-01-28 DIAGNOSIS — Z5112 Encounter for antineoplastic immunotherapy: Secondary | ICD-10-CM | POA: Insufficient documentation

## 2017-01-28 DIAGNOSIS — I2699 Other pulmonary embolism without acute cor pulmonale: Secondary | ICD-10-CM

## 2017-01-28 LAB — CBC WITH DIFFERENTIAL/PLATELET
BASOS ABS: 0 10*3/uL (ref 0–0.1)
Basophils Relative: 1 %
EOS ABS: 0.1 10*3/uL (ref 0–0.7)
EOS PCT: 3 %
HEMATOCRIT: 36.7 % (ref 35.0–47.0)
Hemoglobin: 12.4 g/dL (ref 12.0–16.0)
Lymphocytes Relative: 20 %
Lymphs Abs: 0.9 10*3/uL — ABNORMAL LOW (ref 1.0–3.6)
MCH: 30 pg (ref 26.0–34.0)
MCHC: 33.8 g/dL (ref 32.0–36.0)
MCV: 88.7 fL (ref 80.0–100.0)
MONO ABS: 0.4 10*3/uL (ref 0.2–0.9)
MONOS PCT: 10 %
Neutro Abs: 3 10*3/uL (ref 1.4–6.5)
Neutrophils Relative %: 66 %
PLATELETS: 190 10*3/uL (ref 150–440)
RBC: 4.14 MIL/uL (ref 3.80–5.20)
RDW: 14.8 % — AB (ref 11.5–14.5)
WBC: 4.5 10*3/uL (ref 3.6–11.0)

## 2017-01-28 LAB — COMPREHENSIVE METABOLIC PANEL
ALT: 14 U/L (ref 14–54)
ANION GAP: 3 — AB (ref 5–15)
AST: 19 U/L (ref 15–41)
Albumin: 4.2 g/dL (ref 3.5–5.0)
Alkaline Phosphatase: 89 U/L (ref 38–126)
BILIRUBIN TOTAL: 0.7 mg/dL (ref 0.3–1.2)
BUN: 28 mg/dL — ABNORMAL HIGH (ref 6–20)
CHLORIDE: 108 mmol/L (ref 101–111)
CO2: 30 mmol/L (ref 22–32)
Calcium: 9.2 mg/dL (ref 8.9–10.3)
Creatinine, Ser: 1 mg/dL (ref 0.44–1.00)
GFR calc Af Amer: 55 mL/min — ABNORMAL LOW (ref >60)
GFR calc non Af Amer: 48 mL/min — ABNORMAL LOW (ref >60)
GLUCOSE: 87 mg/dL (ref 65–99)
POTASSIUM: 3.6 mmol/L (ref 3.5–5.1)
Sodium: 141 mmol/L (ref 135–145)
TOTAL PROTEIN: 6.7 g/dL (ref 6.5–8.1)

## 2017-01-28 LAB — LACTATE DEHYDROGENASE: LDH: 146 U/L (ref 98–192)

## 2017-01-28 MED ORDER — SODIUM CHLORIDE 0.9 % IV SOLN
375.0000 mg/m2 | Freq: Once | INTRAVENOUS | Status: AC
  Administered 2017-01-28: 600 mg via INTRAVENOUS
  Filled 2017-01-28: qty 10

## 2017-01-28 MED ORDER — SODIUM CHLORIDE 0.9 % IV SOLN: 375.0000 mg/m2 | Freq: Once | INTRAVENOUS | Status: DC

## 2017-01-28 MED ORDER — SODIUM CHLORIDE 0.9 % IV SOLN
Freq: Once | INTRAVENOUS | Status: AC
  Administered 2017-01-28: 10:00:00 via INTRAVENOUS
  Filled 2017-01-28: qty 1000

## 2017-01-28 MED ORDER — ACETAMINOPHEN 325 MG PO TABS
650.0000 mg | ORAL_TABLET | Freq: Once | ORAL | Status: AC
  Administered 2017-01-28: 650 mg via ORAL
  Filled 2017-01-28: qty 2

## 2017-01-28 MED ORDER — DIPHENHYDRAMINE HCL 25 MG PO CAPS
50.0000 mg | ORAL_CAPSULE | Freq: Once | ORAL | Status: AC
Start: 2017-01-28 — End: 2017-01-28
  Administered 2017-01-28: 50 mg via ORAL
  Filled 2017-01-28: qty 2

## 2017-01-28 MED ORDER — RIVAROXABAN 10 MG PO TABS: 10.0000 mg | ORAL_TABLET | Freq: Every day | ORAL | 4 refills | Status: DC

## 2017-01-28 NOTE — Progress Notes (Signed)
Patient here today for follow up.   

## 2017-01-28 NOTE — Assessment & Plan Note (Addendum)
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 Clinically no evidence of progression noted. PET- April 12th 2018- no evidence of lymphoma noted.  # Today Continue with Proceed with Rituxan; Tolerating without any major side effects. However given the negative PET scan as reviewed above; her age; and also the increasing risk of infections-I think would be reasonable to hold off any further treatments at this time.  # History of diabetes glaucoma- off prednisone.   # RIght LE DVT/ History of PE [March 2017]- on Xarelto stable; recommend cutting down dose of xarelto to 10 mg/day.   Follow up in 4 months/labs No infusion;  PET scan prior.

## 2017-01-28 NOTE — Progress Notes (Signed)
[place] OFFICE PROGRESS NOTE  Patient Care Team: [name], MD as PCP - General (Family Medicine) Seeplaputhur [name], MD as Consulting Physician (General Surgery) Provider Not In System  No matching staging information was found for the patient.   Oncology History   37.  83yo- year-old lady presented with large left neck mass and abdominal palpable mass.  Biopsy of left neck mass is being positive for diffuse B large cell lymphoma; Clinically staged as stage III (June, 2016) 2.  Patient did not want any chemotherapy so was started on prednisone followed by Rituxan in June of 2016 with excellent response 3.  Patient has finished 4 weekly cycle of rituximab on April 16, 2015 with excellent response  4. Because of bulky disease in the past patient is getting maintenance Rituxan therapy started in September of 2016.  # R LE DVT- was found to have a deep vein thrombosis on xeralto. (March, 2017); And right-sided pulmonary emboli  # AUG 2017- PET- NED.       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  female pleasant patient above history of Diffuse large B-cell lymphoma on Rituxan is here for follow-up/To review the results of the PET scan.  Patient denies any recent infections. No new lumps or bumps.Patient denies any weight loss. Denies night sweats. She continues to walk with a rolling walker. No falls. in general appetite is good. No unusual night sweats.  REVIEW OF SYSTEMS:  A complete 10 point review of system is done which is negative except mentioned above/history of present illness.   PAST MEDICAL HISTORY  :  Past Medical History:  Diagnosis Date  . Cancer [facility]) 2011   gallbladder  . Chickenpox   . Diffuse large B cell lymphoma ([place]) 03/17/2015  . Elevated blood pressure   . Glaucoma   . History of blood transfusion     PAST SURGICAL HISTORY :   Past Surgical History:  Procedure Laterality Date  . ABDOMINAL HYSTERECTOMY  1978   For fibroids  . BREAST BIOPSY Right 1990  . TRANSURETHRAL RESECTION OF BLADDER TUMOR  2011     FAMILY HISTORY :   Family History  Problem Relation Age of Onset  . Alcoholism    . Cancer - Lung      Parent  . Cancer - Ovarian Mother   . Prostate cancer      Other relative  . Stroke      Other relative   . High blood pressure      Other relative  . Diabetes      Other relative    SOCIAL HISTORY:   Social History  Substance Use Topics  . Smoking status: Never Smoker  . Smokeless tobacco: Never Used  . Alcohol use No    ALLERGIES:  is allergic to iron and codeine.  MEDICATIONS:  Current Outpatient Prescriptions  Medication Sig Dispense Refill  . brimonidine (ALPHAGAN) 0.2 % ophthalmic solution     . latanoprost (XALATAN) 0.005 % ophthalmic solution     . methocarbamol (ROBAXIN)  500 MG tablet Take 500 mg by mouth.  1  . rivaroxaban (XARELTO) 10 MG TABS tablet Take 1 tablet (10 mg total) by mouth daily. 30 tablet 4  . timolol (TIMOPTIC) 0.5 % ophthalmic solution     . HYDROcodone-acetaminophen (NORCO/VICODIN) 5-325 MG tablet 0.5 - 1 tab 4 times a day as needed for pain (Patient not taking: Reported on 01/28/2017) 60 tablet 0   No current facility-administered medications for this visit.     PHYSICAL EXAMINATION: ECOG PERFORMANCE STATUS: 2 - Symptomatic, <50% confined to bed  BP 124/65 (BP Location: Left Arm, Patient Position: Sitting)   Pulse 79   Temp (!) 96.4 F (35.8 C) (Tympanic)   Wt 113 lb 6 oz (51.4 kg)   BMI 18.30 kg/m   Filed Weights   01/28/17 0903  Weight: 113 lb 6 oz (51.4 kg)    GENERAL: [name]  Elderly Caucasian  female patient accompanied by daughter. She is able to sit on the exam table with great difficulty. Alert, no distress and comfortable.   She is walking with a walker. EYES: no pallor or icterus OROPHARYNX: no thrush or ulceration; good dentition  NECK: supple, no masses felt LYMPH:  no palpable lymphadenopathy in the cervical, axillary or inguinal regions LUNGS: clear to auscultation and  No wheeze or crackles HEART/CVS: regular rate & rhythm and no murmurs; No lower extremity edema ABDOMEN:abdomen soft, non-tender and normal bowel sounds Musculoskeletal:no cyanosis of digits and no clubbing  PSYCH: alert & oriented x 3 with fluent speech NEURO: no focal motor/sensory deficits SKIN:  no rashes or significant lesions  LABORATORY DATA:  I have reviewed the data as listed    Component Value Date/Time   NA 141 01/28/2017 0835   K 3.6 01/28/2017 0835   CL 108 01/28/2017 0835   CO2 30 01/28/2017 0835   GLUCOSE 87 01/28/2017 0835   BUN 28 (H) 01/28/2017 0835   CREATININE 1.00 01/28/2017 0835   CALCIUM 9.2 01/28/2017 0835   PROT 6.7 01/28/2017 0835   ALBUMIN 4.2 01/28/2017 0835   AST 19 01/28/2017 0835   ALT 14 01/28/2017 0835   ALKPHOS 89 01/28/2017 0835   BILITOT 0.7 01/28/2017 0835   GFRNONAA 48 (L) 01/28/2017 0835   GFRAA 55 (L) 01/28/2017 0835    No results found for: SPEP, UPEP  Lab Results  Component Value Date   WBC 4.5 01/28/2017   NEUTROABS 3.0 01/28/2017   HGB 12.4 01/28/2017   HCT 36.7 01/28/2017   MCV 88.7 01/28/2017   PLT 190 01/28/2017      Chemistry      Component Value Date/Time   NA 141 01/28/2017 0835   K 3.6 01/28/2017 0835   CL 108 01/28/2017 0835   CO2 30 01/28/2017 0835   BUN 28 (H) 01/28/2017 0835   CREATININE 1.00 01/28/2017 0835      Component Value Date/Time   CALCIUM 9.2 01/28/2017 0835   ALKPHOS 89 01/28/2017 0835   AST 19 01/28/2017 0835   ALT 14 01/28/2017 0835   BILITOT 0.7 01/28/2017 0835       RADIOGRAPHIC STUDIES: I have  personally reviewed the radiological images as listed and agreed with the findings in the report. No results found.   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 Clinically no evidence of progression noted. PET- April 12th 2018- no evidence of lymphoma noted.  # Today Continue with Proceed with  Rituxan; Tolerating without any major side effects. However given the negative PET scan as reviewed above; her age; and also the increasing risk of infections-I think would be reasonable to hold off any further treatments at this time.  # History of diabetes glaucoma- off prednisone.   # RIght LE DVT/ History of PE [March 2017]- on Xarelto stable; recommend cutting down dose of xarelto to 10 mg/day.   Follow up in 4 months/labs No infusion;  PET scan prior.  # I reviewed the blood work- with the patient in detail; also reviewed the imaging independently [as summarized above]; and with the patient in detail.    Orders Placed This Encounter  Procedures  . NM PET Image Restag (PS) Skull Base To Thigh    Standing Status:   Future    Standing Expiration Date:   03/30/2018    Order Specific Question:   Reason for Exam (SYMPTOM  OR DIAGNOSIS REQUIRED)    Answer:   lymphoma    Order Specific Question:   Preferred imaging location?    Answer:   [facility]  . CBC with Differential/Platelet    Standing Status:   Future    Standing Expiration Date:   01/28/2018  . Comprehensive metabolic panel    Standing Status:   Future    Standing Expiration Date:   01/28/2018   All questions were answered. The patient knows to call the clinic with any problems, questions or concerns.      Cammie Sickle, MD 02/04/2017 4:14 PM

## 2017-02-05 NOTE — Telephone Encounter (Signed)
Called pt to schedule AWV. No vm setup.

## 2017-02-10 ENCOUNTER — Encounter: Payer: Self-pay | Admitting: Family Medicine

## 2017-02-10 ENCOUNTER — Ambulatory Visit (INDEPENDENT_AMBULATORY_CARE_PROVIDER_SITE_OTHER): Payer: PPO | Admitting: Family Medicine

## 2017-02-10 DIAGNOSIS — Z86711 Personal history of pulmonary embolism: Secondary | ICD-10-CM

## 2017-02-10 DIAGNOSIS — F320 Major depressive disorder, single episode, mild: Secondary | ICD-10-CM

## 2017-02-10 DIAGNOSIS — R634 Abnormal weight loss: Secondary | ICD-10-CM | POA: Diagnosis not present

## 2017-02-10 DIAGNOSIS — M25511 Pain in right shoulder: Secondary | ICD-10-CM | POA: Diagnosis not present

## 2017-02-10 NOTE — Progress Notes (Signed)
Tommi Rumps, MD Phone: [phone]  Lisa Sosa is a 83 y.o. female who presents today for follow-up.  History of PE: Currently on Xarelto 10 mg daily. Dose was decreased by her oncologist. She's had no bleeding issues. No shortness breath or chest pain. No swelling.  Does note some depression. She gets lonely at times. This has been going on for years. She's never been on medication or seen a therapist. No SI. She does not want any treatment for this. Her daughter reports to me with [place] outside of the room that the patient has been fairly depressed for a long time. She does not eat well due to her trying to watch what she eats given that she is living with her daughter now  Right shoulder pain: Has followed with orthopedics for this. Has been going on for months. They x-rayed her shoulder and noted it was osteoarthritis. She starts physical therapy this week. She's been using Robaxin as needed. No excessive drowsiness with this.  Weight loss: Her weight has trended down some. She notes she is not eating as much. Appetite not quite as good as it used to be. No early satiety. No night sweats. Does have a history of lymphoma though her most recent PET scan in April was reassuring.  PMH: nonsmoker.   ROS see history of present illness  Objective  Physical Exam Vitals:   02/10/17 1344 02/10/17 1415  BP: (!) 94/58 (!) 102/58  Pulse: 78   Temp: 98.3 F (36.8 C)     BP Readings from Last 3 Encounters:  02/10/17 (!) 102/58  01/28/17 124/65  12/03/16 119/60   Wt Readings from Last 3 Encounters:  02/10/17 111 lb 6.4 oz (50.5 kg)  01/28/17 113 lb 6 oz (51.4 kg)  12/03/16 117 lb (53.1 kg)    Physical Exam  Constitutional: No distress.  Cardiovascular: Normal rate, regular rhythm and normal heart sounds.   Pulmonary/Chest: Effort normal and breath sounds normal.  Musculoskeletal:  No midline neck tenderness, no midline neck step-off, no muscular neck tenderness, mild  trapezius tenderness on the right, mild discomfort on abduction and external rotation of the right shoulder, otherwise no discomfort on range of motion on the right, full range of motion on the left  Neurological: She is alert.  Skin: Skin is warm and dry. She is not diaphoretic.  Psychiatric:  Mood depressed, affect flat     Assessment/Plan: Please see individual problem list.  History of pulmonary embolus (PE) Asymptomatic and tolerating Xarelto with no bleeding. Dose decreased by her oncologist. She'll continue this. Monitor for recurrence of symptoms.  Depression, major, single episode, mild ([place]) Patient with depression. No SI. I suspect this is likely contributing to her weight loss as she has had some appetite loss related to this. Discussed potential options for treatment though the patient deferred these. They will monitor and if her depression worsens will let us know.  Weight loss Weight loss noted over the last several months. Had been gaining after having lost weight previously. Suspect this is related to depression and decreased food intake and decreased appetite. It is reassuring that she had a PET scan recently that did not reveal any recurrence of her lymphoma. Discussed high-calorie foods and getting 3 good meals a day. Offered treatment for her depression though she declined. We'll recheck her in a couple of months to see what her weight is doing.  Right shoulder pain Following with orthopedics. She'll do physical therapy next week. Cautioned against excessive  use of Robaxin. Can use heat as well. She'll continue to monitor.   Tommi Rumps, MD [place]

## 2017-02-10 NOTE — Patient Instructions (Signed)
Nice to see you. Please start eating high calorie foods. These can include high-calorie dairy products, high-calorie not products, boost or ensure meal supplement shakes. These will help maintain your weight. Please monitor the depression and if this worsens let us know.

## 2017-02-10 NOTE — Progress Notes (Signed)
Pre visit review using our clinic review tool, if applicable. No additional management support is needed unless otherwise documented below in the visit note. 

## 2017-02-10 NOTE — Assessment & Plan Note (Signed)
Asymptomatic and tolerating Xarelto with no bleeding. Dose decreased by her oncologist. She'll continue this. Monitor for recurrence of symptoms.

## 2017-02-10 NOTE — Assessment & Plan Note (Signed)
Patient with depression. No SI. I suspect this is likely contributing to her weight loss as she has had some appetite loss related to this. Discussed potential options for treatment though the patient deferred these. They will monitor and if her depression worsens will let us know.

## 2017-02-10 NOTE — Assessment & Plan Note (Signed)
Following with orthopedics. She'll do physical therapy next week. Cautioned against excessive use of Robaxin. Can use heat as well. She'll continue to monitor.

## 2017-02-10 NOTE — Assessment & Plan Note (Signed)
Weight loss noted over the last several months. Had been gaining after having lost weight previously. Suspect this is related to depression and decreased food intake and decreased appetite. It is reassuring that she had a PET scan recently that did not reveal any recurrence of her lymphoma. Discussed high-calorie foods and getting 3 good meals a day. Offered treatment for her depression though she declined. We'll recheck her in a couple of months to see what her weight is doing.

## 2017-02-12 DIAGNOSIS — M47812 Spondylosis without myelopathy or radiculopathy, cervical region: Secondary | ICD-10-CM | POA: Diagnosis not present

## 2017-02-12 DIAGNOSIS — M542 Cervicalgia: Secondary | ICD-10-CM | POA: Diagnosis not present

## 2017-02-14 IMAGING — CT CT ABDOMEN W/ CM
2 of 5 series · 14 of 46 positions shown, 16 images · IV contrast (iopamidol)
Comparison: Right lower extremity ultrasound of 01/06/2016.
Abdominal pelvic CT of 12/13/2015. No comparison chest CT.

CLINICAL DATA: Diffuse large B-cell lymphoma. Abdominal pain and
right chest/ shoulder pain. Right lower extremity edema for 2
months. Recently treated for diverticulitis.

EXAM:
CT CHEST AND ABDOMEN WITH CONTRAST
TECHNIQUE: Multidetector CT imaging of the chest and abdomen was performed
following the standard protocol during bolus administration of
intravenous contrast.
CONTRAST:  100mL CI2JM5-TFF IOPAMIDOL (CI2JM5-TFF) INJECTION 61%

[Series 2: cap with · axial · 0.66mm/px · z∈[+683,+1063]mm · 11 of 88 slices shown, 13 images]
[im 6/88  soft-tissue]
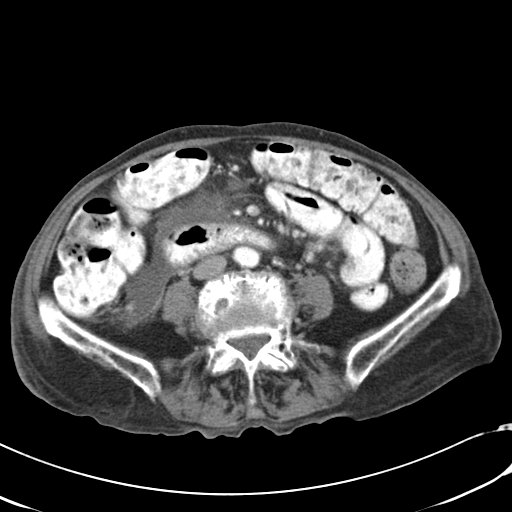
[im 6/88  bone]
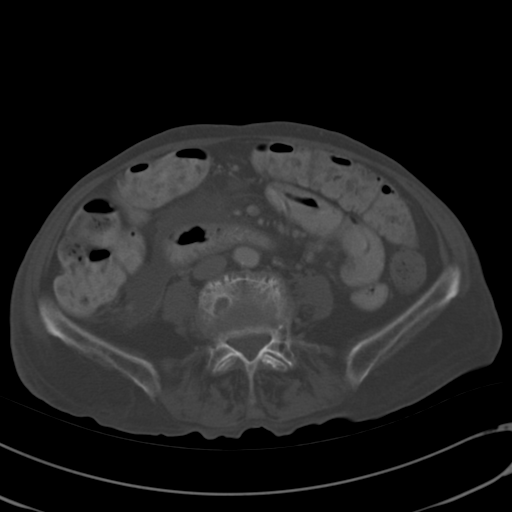
[im 12/88  soft-tissue]
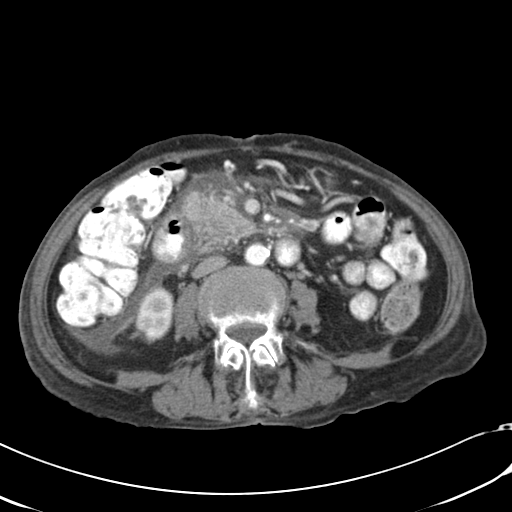
[im 24/88  soft-tissue]
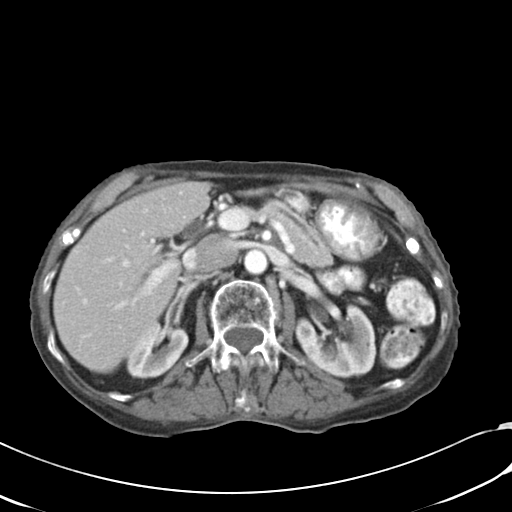
[im 30/88  soft-tissue]
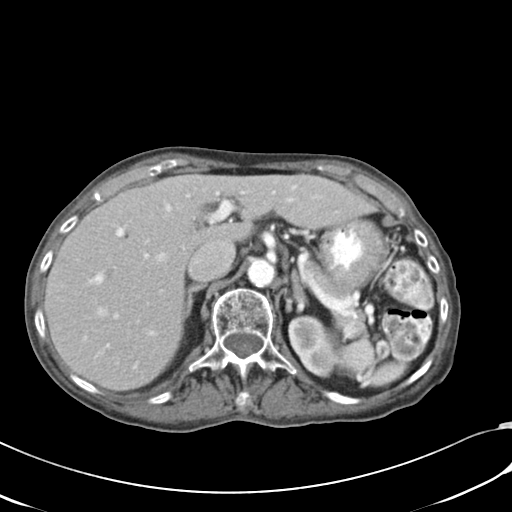
[im 35/88  soft-tissue]
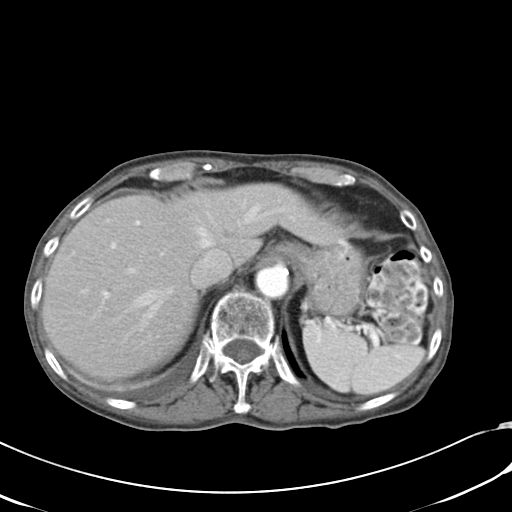
[im 47/88  soft-tissue]
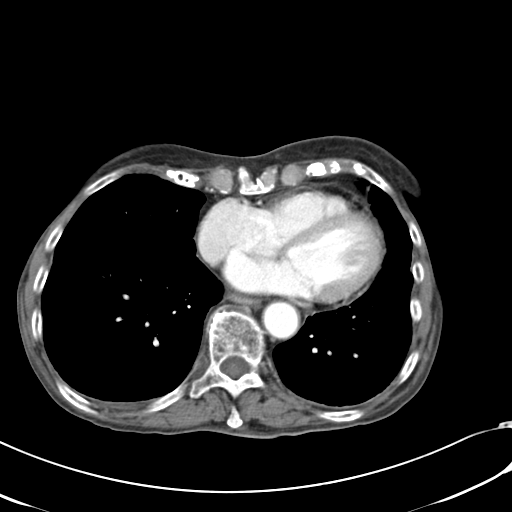
[im 53/88  soft-tissue]
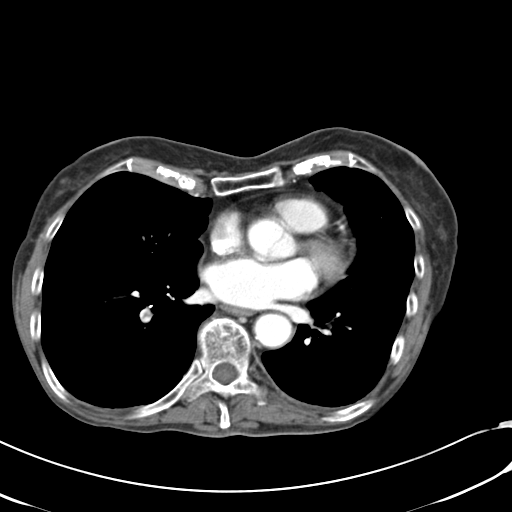
[im 59/88  soft-tissue]
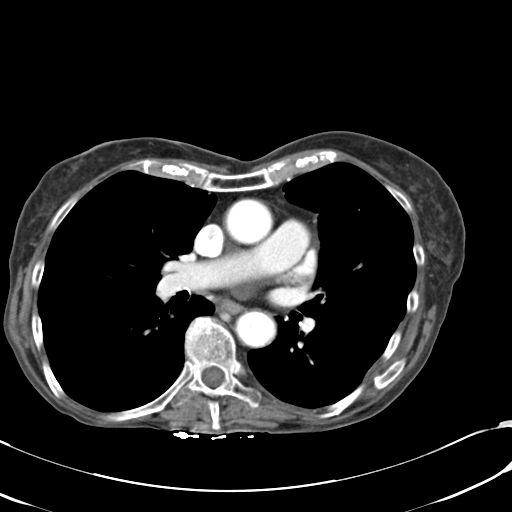
[im 64/88  soft-tissue]
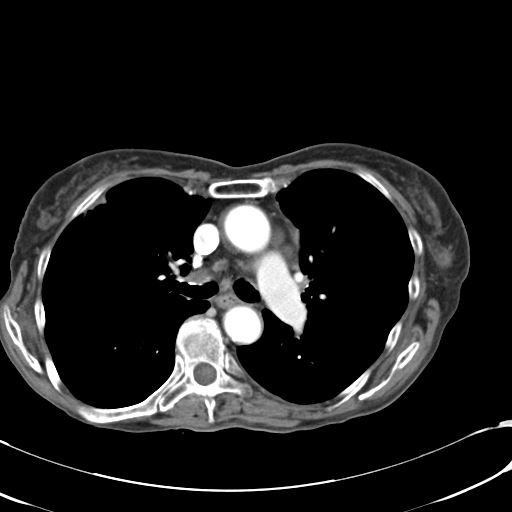
[im 64/88  bone]
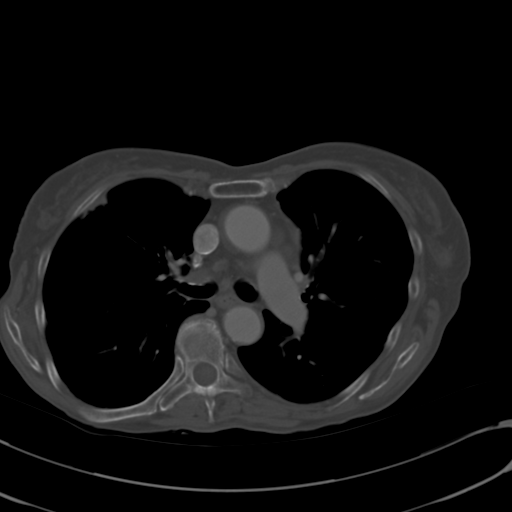
[im 76/88  soft-tissue]
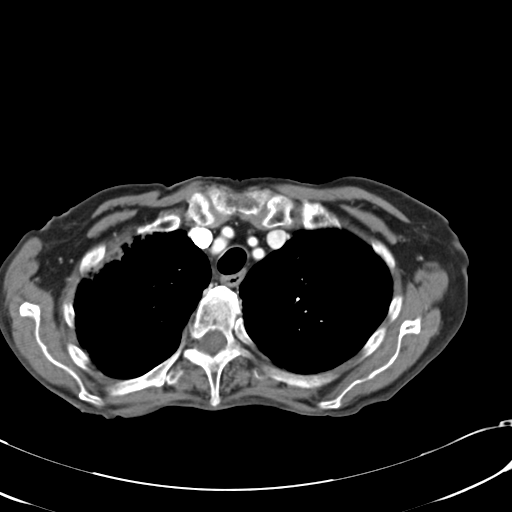
[im 82/88  soft-tissue]
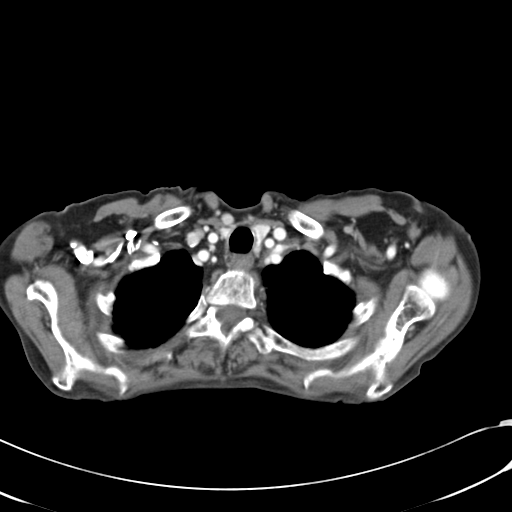

[Series 6: cor cap with cor cor · coronal · 0.56mm/px · 3 of 101 slices shown]
[im 34/101  soft-tissue]
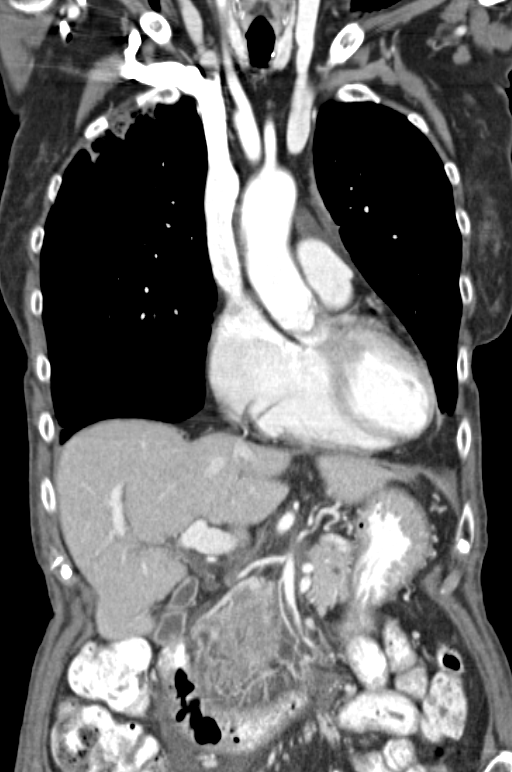
[im 45/101  soft-tissue]
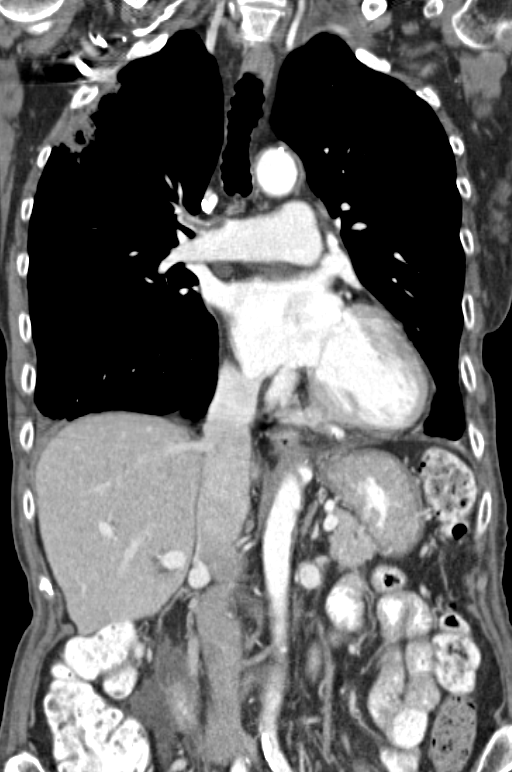
[im 56/101  soft-tissue]
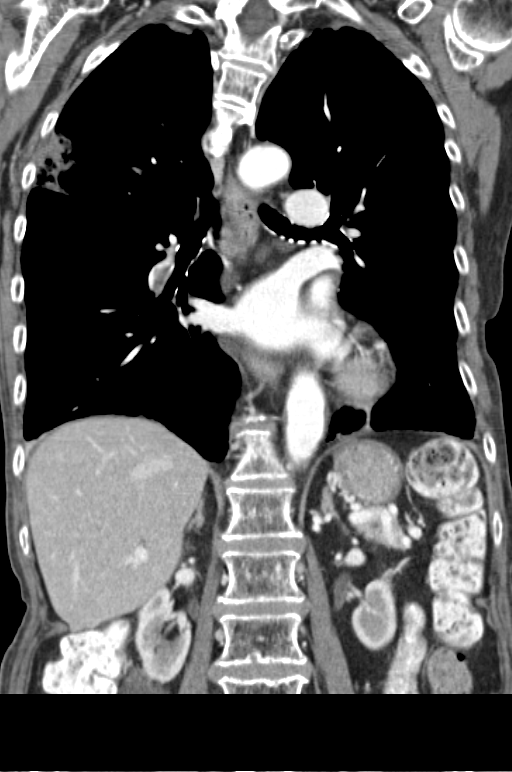

[14 of 46 positions shown; findings below may reference images not displayed]

FINDINGS: CT CHEST WITH CONTRAST

Mediastinum/Lymph Nodes: No supraclavicular adenopathy. No axillary
adenopathy. Tortuous thoracic aorta. Mild cardiomegaly, accentuated
by a pectus excavatum deformity.

Right-sided pulmonary emboli, including in the main right pulmonary
artery on image 26/series 2. Extension into right upper and right
lower lobe pulmonary artery branches.

No evidence of elevated right heart pressure. The RV/LV ratio is
significantly less than the 0.9.

No mediastinal or hilar adenopathy. Subtle fluid level within the
upper esophagus on image 17/series 2.

Lungs/Pleura: Small right and trace left pleural effusions.

Patchy pleural-based right upper lobe airspace and ground-glass
opacity.

Musculoskeletal: No acute osseous abnormality. Convex right thoracic
spine curvature.

CT ABDOMEN WITH CONTRAST

Hepatobiliary: 5 mm hypo attenuating segment 4 lesion is unchanged
and likely a cyst. Normal gallbladder, without biliary ductal
dilatation.

Pancreas: The pancreas enhances normally. Fluid is identified about
the pancreatic head and adjacent duodenal C-loop. Example image 78/
series 2. New since 12/13/2015.

Spleen: Normal in size, without focal abnormality.

Adrenals/Urinary Tract: Normal adrenal glands. bilateral too small
to characterize lesions. No hydronephrosis.

Stomach/Bowel: Normal stomach, without wall thickening. normal
abdominal portions of the colon. Otherwise normal small bowel.

Vascular/Lymphatic: Aortic and branch vessel atherosclerosis.
Residual soft tissue thickening within the retroperitoneum, at the
site of adenopathy back in [REDACTED]. Example adjacent the superior
mesenteric artery at 1.6 x 1.1 cm on image 62/ series 2. Compare
x 1.5 cm on the prior.

Other: No significant intraperitoneal fluid.

Musculoskeletal: Osteopenia.
IMPRESSION: 1. Relatively large volume of right-sided pulmonary emboli. Per
technologist notes, Dr. Araujo was called with these preliminary
results at [DATE] a.m. these results will also be called to the
ordering clinician or representative by the Radiologist Assistant,
and communication documented in the PACS or zVision Dashboard.
2. Retroperitoneal fluid centered about the pancreatic head and
duodenal C-loop. This could represent pancreatitis or duodenitis.
Correlate with pancreatic enzymes.
3. Further improvement in soft tissue thickening within the
retroperitoneum, consistent with treated lymphoma.
4. Right upper lobe pulmonary infarct.
5. Trace bilateral pleural effusions.
6. Esophageal air fluid level suggests dysmotility or
gastroesophageal reflux.

## 2017-02-19 DIAGNOSIS — M542 Cervicalgia: Secondary | ICD-10-CM | POA: Diagnosis not present

## 2017-02-19 DIAGNOSIS — M47812 Spondylosis without myelopathy or radiculopathy, cervical region: Secondary | ICD-10-CM | POA: Diagnosis not present

## 2017-02-24 DIAGNOSIS — H401112 Primary open-angle glaucoma, right eye, moderate stage: Secondary | ICD-10-CM | POA: Diagnosis not present

## 2017-02-24 DIAGNOSIS — H401123 Primary open-angle glaucoma, left eye, severe stage: Secondary | ICD-10-CM | POA: Diagnosis not present

## 2017-03-01 DIAGNOSIS — H401123 Primary open-angle glaucoma, left eye, severe stage: Secondary | ICD-10-CM | POA: Diagnosis not present

## 2017-04-12 ENCOUNTER — Ambulatory Visit: Payer: PPO | Admitting: Family Medicine

## 2017-04-20 ENCOUNTER — Ambulatory Visit: Payer: PPO | Admitting: Family Medicine

## 2017-05-24 ENCOUNTER — Ambulatory Visit
Admission: RE | Admit: 2017-05-24 | Discharge: 2017-05-24 | Disposition: A | Discharge status: Home / Self Care | Payer: PPO | Source: Ambulatory Visit | Attending: Internal Medicine | Admitting: Internal Medicine

## 2017-05-24 DIAGNOSIS — C8581 Other specified types of non-Hodgkin lymphoma, lymph nodes of head, face, and neck: Secondary | ICD-10-CM

## 2017-05-24 DIAGNOSIS — K5730 Diverticulosis of large intestine without perforation or abscess without bleeding: Secondary | ICD-10-CM | POA: Insufficient documentation

## 2017-05-24 DIAGNOSIS — Q676 Pectus excavatum: Secondary | ICD-10-CM | POA: Diagnosis not present

## 2017-05-24 DIAGNOSIS — I700 Atherosclerosis of aorta: Secondary | ICD-10-CM | POA: Insufficient documentation

## 2017-05-24 DIAGNOSIS — I517 Cardiomegaly: Secondary | ICD-10-CM | POA: Diagnosis not present

## 2017-05-24 DIAGNOSIS — C8591 Non-Hodgkin lymphoma, unspecified, lymph nodes of head, face, and neck: Secondary | ICD-10-CM | POA: Diagnosis not present

## 2017-05-24 LAB — GLUCOSE, CAPILLARY: Glucose-Capillary: 80 mg/dL (ref 65–99)

## 2017-05-24 MED ORDER — FLUDEOXYGLUCOSE F - 18 (FDG) INJECTION
12.0000 | Freq: Once | INTRAVENOUS | Status: AC | PRN
  Administered 2017-05-24: 11.89 via INTRAVENOUS

## 2017-05-31 ENCOUNTER — Inpatient Hospital Stay (HOSPITAL_BASED_OUTPATIENT_CLINIC_OR_DEPARTMENT_OTHER): Payer: PPO | Admitting: Internal Medicine

## 2017-05-31 ENCOUNTER — Inpatient Hospital Stay: Payer: PPO | Attending: Internal Medicine

## 2017-05-31 VITALS — BP 129/70 | HR 70 | Temp 96.6°F | Resp 16 | Wt 110.0 lb

## 2017-05-31 DIAGNOSIS — I2510 Atherosclerotic heart disease of native coronary artery without angina pectoris: Secondary | ICD-10-CM | POA: Diagnosis not present

## 2017-05-31 DIAGNOSIS — Z801 Family history of malignant neoplasm of trachea, bronchus and lung: Secondary | ICD-10-CM

## 2017-05-31 DIAGNOSIS — Z8042 Family history of malignant neoplasm of prostate: Secondary | ICD-10-CM

## 2017-05-31 DIAGNOSIS — Z86718 Personal history of other venous thrombosis and embolism: Secondary | ICD-10-CM | POA: Diagnosis not present

## 2017-05-31 DIAGNOSIS — Z8041 Family history of malignant neoplasm of ovary: Secondary | ICD-10-CM

## 2017-05-31 DIAGNOSIS — Z8572 Personal history of non-Hodgkin lymphomas: Secondary | ICD-10-CM | POA: Insufficient documentation

## 2017-05-31 DIAGNOSIS — I517 Cardiomegaly: Secondary | ICD-10-CM | POA: Insufficient documentation

## 2017-05-31 DIAGNOSIS — Z79899 Other long term (current) drug therapy: Secondary | ICD-10-CM | POA: Insufficient documentation

## 2017-05-31 DIAGNOSIS — Z8551 Personal history of malignant neoplasm of bladder: Secondary | ICD-10-CM | POA: Diagnosis not present

## 2017-05-31 DIAGNOSIS — Z8719 Personal history of other diseases of the digestive system: Secondary | ICD-10-CM

## 2017-05-31 DIAGNOSIS — C8581 Other specified types of non-Hodgkin lymphoma, lymph nodes of head, face, and neck: Secondary | ICD-10-CM

## 2017-05-31 DIAGNOSIS — Z7901 Long term (current) use of anticoagulants: Secondary | ICD-10-CM

## 2017-05-31 LAB — CBC WITH DIFFERENTIAL/PLATELET
Basophils Absolute: 0 10*3/uL (ref 0–0.1)
Basophils Relative: 1 %
EOS ABS: 0.1 10*3/uL (ref 0–0.7)
EOS PCT: 3 %
HCT: 36.7 % (ref 35.0–47.0)
Hemoglobin: 12.5 g/dL (ref 12.0–16.0)
Lymphocytes Relative: 23 %
Lymphs Abs: 1.1 10*3/uL (ref 1.0–3.6)
MCH: 30.8 pg (ref 26.0–34.0)
MCHC: 34.1 g/dL (ref 32.0–36.0)
MCV: 90.3 fL (ref 80.0–100.0)
MONO ABS: 0.5 10*3/uL (ref 0.2–0.9)
MONOS PCT: 11 %
Neutro Abs: 2.9 10*3/uL (ref 1.4–6.5)
Neutrophils Relative %: 62 %
PLATELETS: 162 10*3/uL (ref 150–440)
RBC: 4.07 MIL/uL (ref 3.80–5.20)
RDW: 14.2 % (ref 11.5–14.5)
WBC: 4.6 10*3/uL (ref 3.6–11.0)

## 2017-05-31 LAB — COMPREHENSIVE METABOLIC PANEL
ALT: 15 U/L (ref 14–54)
ANION GAP: 8 (ref 5–15)
AST: 21 U/L (ref 15–41)
Albumin: 4.1 g/dL (ref 3.5–5.0)
Alkaline Phosphatase: 66 U/L (ref 38–126)
BUN: 30 mg/dL — ABNORMAL HIGH (ref 6–20)
CHLORIDE: 107 mmol/L (ref 101–111)
CO2: 27 mmol/L (ref 22–32)
CREATININE: 1.06 mg/dL — AB (ref 0.44–1.00)
Calcium: 9.3 mg/dL (ref 8.9–10.3)
GFR, EST AFRICAN AMERICAN: 51 mL/min — AB (ref >60)
GFR, EST NON AFRICAN AMERICAN: 44 mL/min — AB (ref >60)
Glucose, Bld: 88 mg/dL (ref 65–99)
POTASSIUM: 4.8 mmol/L (ref 3.5–5.1)
SODIUM: 142 mmol/L (ref 135–145)
Total Bilirubin: 0.8 mg/dL (ref 0.3–1.2)
Total Protein: 6.7 g/dL (ref 6.5–8.1)

## 2017-05-31 NOTE — Progress Notes (Signed)
[place] OFFICE PROGRESS NOTE  Patient Care Team: [name], MD as PCP - General (Family Medicine) Christene Lye, MD as Consulting Physician (General Surgery) System, Provider Not In  No matching staging information was found for the patient.   Oncology History   41.  83yo- year-old lady presented with large left neck mass and abdominal palpable mass.  Biopsy of left neck mass is being positive for diffuse B large cell lymphoma; Clinically staged as stage III (June, 2016) 2.  Patient did not want any chemotherapy so was started on prednisone followed by Rituxan in June of 2016 with excellent response 3.  Patient has finished 4 weekly cycle of rituximab on April 16, 2015 with excellent response  4. Because of bulky disease in the past patient is getting maintenance Rituxan therapy started in September of 2016.  # R LE DVT- was found to have a deep vein thrombosis on xeralto. (March, 2017); And right-sided pulmonary emboli  # AUG 2017- PET- NED.       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  female pleasant patient above history of Diffuse large B-cell lymphoma on Rituxan is here for follow-up/To review the results of the PET scan.  Right axilla was held approximate 4 months ago; because a previous CT scan that was improved. She denies any falls. She continues in the blood thinners. Patient denies any weight loss. Denies night sweats. She continues to walk with a rolling walker.   Patient denies any recent infections. No new lumps or bumps. In general appetite is good. No unusual night sweats.  REVIEW  OF SYSTEMS:  A complete 10 point review of system is done which is negative except mentioned above/history of present illness.   PAST MEDICAL HISTORY :  Past Medical History:  Diagnosis Date  . Cancer [facility]) 2011   gallbladder  . Chickenpox   . Diffuse large B cell lymphoma ([place]) 03/17/2015  . Elevated blood pressure   . Glaucoma   . History of blood transfusion     PAST SURGICAL HISTORY :   Past Surgical History:  Procedure Laterality Date  . ABDOMINAL HYSTERECTOMY  1978   For fibroids  . BREAST BIOPSY Right 1990  . TRANSURETHRAL RESECTION OF BLADDER TUMOR  2011     FAMILY HISTORY :   Family History  Problem Relation Age of Onset  . Alcoholism Unknown   . Cancer - Lung Unknown        Parent  . Cancer - Ovarian Mother   . Prostate cancer Unknown        Other relative  . Stroke Unknown        Other relative   . High blood pressure Unknown        Other relative  . Diabetes Unknown        Other relative    SOCIAL HISTORY:   Social History  Substance Use Topics  . Smoking status: Never Smoker  . Smokeless tobacco: Never Used  . Alcohol use No    ALLERGIES:  is  allergic to iron and codeine.  MEDICATIONS:  Current Outpatient Prescriptions  Medication Sig Dispense Refill  . brimonidine (ALPHAGAN) 0.2 % ophthalmic solution     . latanoprost (XALATAN) 0.005 % ophthalmic solution     . methocarbamol (ROBAXIN) 500 MG tablet Take 500 mg by mouth.  1  . rivaroxaban (XARELTO) 10 MG TABS tablet Take 1 tablet (10 mg total) by mouth daily. 30 tablet 4  . timolol (TIMOPTIC) 0.5 % ophthalmic solution      No current facility-administered medications for this visit.     PHYSICAL EXAMINATION: ECOG PERFORMANCE STATUS: 2 - Symptomatic, <50% confined to bed  BP 129/70   Pulse 70   Temp (!) 96.6 F (35.9 C) (Tympanic)   Resp 16   Wt 110 lb (49.9 kg)   BMI 17.75 kg/m   Filed Weights   05/31/17 1406  Weight: 110 lb (49.9 kg)    GENERAL: [name]  Elderly Caucasian female  patient accompanied by daughter. She is able to sit on the exam table with great difficulty. Alert, no distress and comfortable.   She is walking with a walker. With her daughter.  EYES: no pallor or icterus OROPHARYNX: no thrush or ulceration; good dentition  NECK: supple, no masses felt LYMPH:  no palpable lymphadenopathy in the cervical, axillary or inguinal regions LUNGS: clear to auscultation and  No wheeze or crackles HEART/CVS: regular rate & rhythm and no murmurs; No lower extremity edema ABDOMEN:abdomen soft, non-tender and normal bowel sounds Musculoskeletal:no cyanosis of digits and no clubbing  PSYCH: alert & oriented x 3 with fluent speech NEURO: no focal motor/sensory deficits SKIN:  no rashes or significant lesions  LABORATORY DATA:  I have reviewed the data as listed    Component Value Date/Time   NA 142 05/31/2017 1344   K 4.8 05/31/2017 1344   CL 107 05/31/2017 1344   CO2 27 05/31/2017 1344   GLUCOSE 88 05/31/2017 1344   BUN 30 (H) 05/31/2017 1344   CREATININE 1.06 (H) 05/31/2017 1344   CALCIUM 9.3 05/31/2017 1344   PROT 6.7 05/31/2017 1344   ALBUMIN 4.1 05/31/2017 1344   AST 21 05/31/2017 1344   ALT 15 05/31/2017 1344   ALKPHOS 66 05/31/2017 1344   BILITOT 0.8 05/31/2017 1344   GFRNONAA 44 (L) 05/31/2017 1344   GFRAA 51 (L) 05/31/2017 1344    No results found for: SPEP, UPEP  Lab Results  Component Value Date   WBC 4.6 05/31/2017   NEUTROABS 2.9 05/31/2017   HGB 12.5 05/31/2017   HCT 36.7 05/31/2017   MCV 90.3 05/31/2017   PLT 162 05/31/2017      Chemistry      Component Value Date/Time   NA 142 05/31/2017 1344   K 4.8 05/31/2017 1344   CL 107 05/31/2017 1344   CO2 27 05/31/2017 1344   BUN 30 (H) 05/31/2017 1344   CREATININE 1.06 (H) 05/31/2017 1344      Component Value Date/Time   CALCIUM 9.3 05/31/2017 1344   ALKPHOS 66 05/31/2017 1344   AST 21 05/31/2017 1344   ALT 15 05/31/2017 1344   BILITOT 0.8 05/31/2017 1344      IMPRESSION: 1. Slight improvement in the right hilar and infrahilar hypermetabolic activity, without nodal enlargement. Appearance nonspecific for low-grade malignancy versus reactive lymph nodes. 2. Other imaging findings of potential clinical significance: Aortic Atherosclerosis (ICD10-I70.0). Pectus excavatum. Mild cardiomegaly. Sigmoid diverticulosis.   Electronically Signed   By: Van Clines M.D.   On: 05/24/2017 14:24  RADIOGRAPHIC STUDIES: I have personally reviewed the radiological images as listed and agreed with the findings in the report. No results found.   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 Clinically no evidence of progression noted. last Rituxan- June 2018.  PET- AUG 15th 2018- no evidence of lymphoma noted.   # RIght LE DVT/ History of PE [March 2017]- on Xarelto 10 mg/day. No falls. Continue the same.   Follow up in 4 months/labs No infusion; PET prior.   # I reviewed the blood work- with the patient in detail; also reviewed the imaging independently [as summarized above]; and with the patient in detail.   # 25 minutes face-to-face with the patient discussing the above plan of care; more than 50% of time spent on prognosis/ natural history; counseling and coordination.  # I reviewed the blood work- with the patient in detail; also reviewed the imaging independently [as summarized above]; and with the patient in detail.    Orders Placed This Encounter  Procedures  . NM PET Image Restag (PS) Skull Base To Thigh    Standing Status:   Future    Standing Expiration Date:   05/31/2018    Order Specific Question:   If indicated for the ordered procedure, I authorize the administration of a radiopharmaceutical per Radiology protocol    Answer:   Yes    Order Specific Question:   Preferred imaging location?    Answer:   [facility]    Order Specific Question:    Radiology Contrast Protocol - do NOT remove file path    Answer:   [email or web address]    Order Specific Question:   Reason for Exam additional comments    Answer:   large cell lymphoma  . CBC with Differential    Standing Status:   Future    Standing Expiration Date:   05/31/2018  . Comprehensive metabolic panel    Standing Status:   Future    Standing Expiration Date:   05/31/2018   All questions were answered. The patient knows to call the clinic with any problems, questions or concerns.      Cammie Sickle, MD 05/31/2017 2:47 PM

## 2017-05-31 NOTE — Progress Notes (Signed)
Patient does not offer any problems today.  

## 2017-05-31 NOTE — Assessment & Plan Note (Addendum)
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 Clinically no evidence of progression noted. last Rituxan- June 2018.  PET- AUG 15th 2018- no evidence of lymphoma noted.   # RIght LE DVT/ History of PE [March 2017]- on Xarelto 10 mg/day. No falls. Continue the same.   Follow up in 4 months/labs No infusion; PET prior.   # I reviewed the blood work- with the patient in detail; also reviewed the imaging independently [as summarized above]; and with the patient in detail.   # 25 minutes face-to-face with the patient discussing the above plan of care; more than 50% of time spent on prognosis/ natural history; counseling and coordination.

## 2017-06-07 DIAGNOSIS — H401123 Primary open-angle glaucoma, left eye, severe stage: Secondary | ICD-10-CM | POA: Diagnosis not present

## 2017-07-09 DIAGNOSIS — H401123 Primary open-angle glaucoma, left eye, severe stage: Secondary | ICD-10-CM | POA: Diagnosis not present

## 2017-07-20 ENCOUNTER — Other Ambulatory Visit: Payer: Self-pay | Admitting: Internal Medicine

## 2017-07-20 DIAGNOSIS — I2699 Other pulmonary embolism without acute cor pulmonale: Secondary | ICD-10-CM

## 2017-07-20 DIAGNOSIS — C8339 Diffuse large B-cell lymphoma, extranodal and solid organ sites: Secondary | ICD-10-CM

## 2017-07-30 DIAGNOSIS — H401123 Primary open-angle glaucoma, left eye, severe stage: Secondary | ICD-10-CM | POA: Diagnosis not present

## 2017-08-16 ENCOUNTER — Ambulatory Visit (INDEPENDENT_AMBULATORY_CARE_PROVIDER_SITE_OTHER): Payer: PPO

## 2017-08-16 DIAGNOSIS — Z23 Encounter for immunization: Secondary | ICD-10-CM

## 2017-08-18 ENCOUNTER — Ambulatory Visit: Payer: PPO | Admitting: Family

## 2017-08-18 ENCOUNTER — Encounter: Payer: Self-pay | Admitting: Family

## 2017-08-18 ENCOUNTER — Other Ambulatory Visit: Payer: Self-pay | Admitting: Internal Medicine

## 2017-08-18 ENCOUNTER — Ambulatory Visit: Payer: Self-pay

## 2017-08-18 VITALS — BP 142/90 | HR 91 | Temp 98.1°F | Ht 66.0 in | Wt 109.6 lb

## 2017-08-18 DIAGNOSIS — L0390 Cellulitis, unspecified: Secondary | ICD-10-CM

## 2017-08-18 DIAGNOSIS — C8339 Diffuse large B-cell lymphoma, extranodal and solid organ sites: Secondary | ICD-10-CM

## 2017-08-18 DIAGNOSIS — I2699 Other pulmonary embolism without acute cor pulmonale: Secondary | ICD-10-CM

## 2017-08-18 MED ORDER — CEPHALEXIN 500 MG PO CAPS: 500.0000 mg | ORAL_CAPSULE | Freq: Two times a day (BID) | ORAL | 0 refills | Status: AC

## 2017-08-18 NOTE — Progress Notes (Signed)
Pre visit review using our clinic review tool, if applicable. No additional management support is needed unless otherwise documented below in the visit note. 

## 2017-08-18 NOTE — Telephone Encounter (Signed)
Noted.  Seen by Joycelyn Schmid.

## 2017-08-18 NOTE — Telephone Encounter (Signed)
Pt received flu shot to left arm Monday and initially developed a lemon sized area of redness under the shot area. Daughter states that pt now has red streaks down her arm midway toward the elbow and is warm to touch. States pt was not feeling well yesterday and feels worse today. Pt has nausea today. Appt made today At 1130 with Dr Vidal Schwalbe Reason for Disposition . [1] Redness or red streak around the injection site AND [2] begins > 48 hours after shot AND [3] no fever  (Exception: red area < 1 inch or 2.5 cm wide)  Answer Assessment - Initial Assessment Questions 1. SYMPTOMS: "What is the main symptom?" (e.g., redness, swelling, pain)      Left upper redness and swelling that spread down arm halfway to elbow 2. ONSET: "When was the vaccine (shot) given?" "How much later did the __________ begin?" (e.g., hours, days ago)      Monday 08/16/17 3. SEVERITY: "How bad is it?"      Initially the size of a lemon now has streaks down half way toward the elbow feels warm to the touch 4. FEVER: "Is there a fever?" If so, ask: "What is it, how was it measured, and when did it start?"      Per daughter pt doesn't feel warm 5. IMMUNIZATIONS GIVEN: "What shots have you recently received?"     Flu shot 6. PAST REACTIONS: "Have you reacted to immunizations before?" If so, ask: "What happened?"     No just soreness for a few days  7. OTHER SYMPTOMS: "Do you have any other symptoms?"     nausea  Protocols used: IMMUNIZATION REACTIONS-A-AH

## 2017-08-18 NOTE — Telephone Encounter (Signed)
fyi

## 2017-08-18 NOTE — Progress Notes (Signed)
Subjective:    Patient ID: Lisa Sosa, female    DOB: [birth year], 83 y.o.   MRN: [id number]  CC: Lisa Sosa is a 83 y.o. female who presents today for an acute visit.    HPI: Left upper arm redness, swelling, x 1 one day, worsening. Accompanied by daughter.   Red, swollen left arm. No purulent discharge from site, fever, sob, trouble breathing, le weakness.   Tried ibuprofen with some relief.   Never had immunization reaction in past  High dose influenza 11/5  No h/o mrsa    HISTORY:  Past Medical History:  Diagnosis Date  . Cancer [facility]) 2011   gallbladder  . Chickenpox   . Diffuse large B cell lymphoma ([place]) 03/17/2015  . Elevated blood pressure   . Glaucoma   . History of blood transfusion    Past Surgical History:  Procedure Laterality Date  . ABDOMINAL HYSTERECTOMY  1978   For fibroids  . BREAST BIOPSY Right 1990  . TRANSURETHRAL RESECTION OF BLADDER TUMOR  2011    Family History  Problem Relation Age of Onset  . Alcoholism Unknown   . Cancer - Lung Unknown        Parent  . Cancer - Ovarian Mother   . Prostate cancer Unknown        Other relative  . Stroke Unknown        Other relative   . High blood pressure Unknown        Other relative  . Diabetes Unknown        Other relative    Allergies: Iron and Codeine Current Outpatient Medications on File Prior to Visit  Medication Sig Dispense Refill  . brimonidine (ALPHAGAN) 0.2 % ophthalmic solution     . latanoprost (XALATAN) 0.005 % ophthalmic solution     . methocarbamol (ROBAXIN) 500 MG tablet Take 500 mg by mouth.  1  . timolol (TIMOPTIC) 0.5 % ophthalmic solution     . XARELTO 10 MG TABS tablet TAKE 1 TABLET(10 MG) BY MOUTH DAILY 30 tablet 0   No current facility-administered medications on file prior to visit.     Social History   Tobacco Use  . Smoking status: Never Smoker  . Smokeless tobacco: Never Used  Substance Use Topics  . Alcohol use: No    Alcohol/week: 0.0 oz  . Drug  use: No    Review of Systems  Constitutional: Negative for chills and fever.  Respiratory: Negative for cough.   Cardiovascular: Negative for chest pain and palpitations.  Gastrointestinal: Negative for nausea and vomiting.  Skin: Positive for color change and rash.  Neurological: Negative for headaches.      Objective:    There were no vitals taken for this visit.   Physical Exam  Constitutional: She appears well-developed and well-nourished.  Eyes: Conjunctivae are normal.  Cardiovascular: Normal rate, regular rhythm, normal heart sounds and normal pulses.  Pulmonary/Chest: Effort normal and breath sounds normal. She has no wheezes. She has no rhonchi. She has no rales.  Neurological: She is alert.  Skin: Skin is warm and dry.     Localized erythematous area noted left upper arm as marked on diagram. Increase in warmth. No purulent discharge. Skin intact.  Psychiatric: She has a normal mood and affect. Her speech is normal and behavior is normal. Thought content normal.  Vitals reviewed.      Assessment & Plan:   1. Cellulitis, unspecified cellulitis site Afebrile. Patient's well-appearing. Concern  for cellulitis after immunization. Will treat with Keflex at renally safe dose for patient. Return precautions given.  - cephALEXin (KEFLEX) 500 MG capsule; Take 1 capsule (500 mg total) 2 (two) times daily for 7 days by mouth.  Dispense: 14 capsule; Refill: 0    I am having Lisa Sosa start on cephALEXin. I am also having her maintain her latanoprost, brimonidine, timolol, methocarbamol, and XARELTO.   Meds ordered this encounter  Medications  . cephALEXin (KEFLEX) 500 MG capsule    Sig: Take 1 capsule (500 mg total) 2 (two) times daily for 7 days by mouth.    Dispense:  14 capsule    Refill:  0    Order Specific Question:   Supervising Provider    Answer:   Crecencio Mc [[id number]]    Return precautions given.   Risks, benefits, and alternatives of the  medications and treatment plan prescribed today were discussed, and patient expressed understanding.   Education regarding symptom management and diagnosis given to patient on AVS.  Continue to follow with [name], MD for routine health maintenance.   Alvira Philips and I agreed with plan.   Mable Paris, FNP

## 2017-08-18 NOTE — Patient Instructions (Signed)
Start keflex  Please stay vigilant for any worsening features including increase in redness, pain, or systemic features including fever, overall not feeling well.   should suspect improvement in the next 24-48 hours. Please call us if not.   Ensure to take probiotics while on antibiotics and also for 2 weeks after completion. It is important to re-colonize the gut with good bacteria and also to prevent any diarrheal infections associated with antibiotic use.    Cellulitis, Adult Cellulitis is a skin infection. The infected area is usually red and sore. This condition occurs most often in the arms and lower legs. It is very important to get treated for this condition. Follow these instructions at home:  Take over-the-counter and prescription medicines only as told by your doctor.  If you were prescribed an antibiotic medicine, take it as told by your doctor. Do not stop taking the antibiotic even if you start to feel better.  Drink enough fluid to keep your pee (urine) clear or pale yellow.  Do not touch or rub the infected area.  Raise (elevate) the infected area above the level of your heart while you are sitting or lying down.  Place warm or cold wet cloths (warm or cold compresses) on the infected area. Do this as told by your doctor.  Keep all follow-up visits as told by your doctor. This is important. These visits let your doctor make sure your infection is not getting worse. Contact a doctor if:  You have a fever.  Your symptoms do not get better after 1-2 days of treatment.  Your bone or joint under the infected area starts to hurt after the skin has healed.  Your infection comes back. This can happen in the same area or another area.  You have a swollen bump in the infected area.  You have new symptoms.  You feel ill and also have muscle aches and pains. Get help right away if:  Your symptoms get worse.  You feel very sleepy.  You throw up (vomit) or have  watery poop (diarrhea) for a long time.  There are red streaks coming from the infected area.  Your red area gets larger.  Your red area turns darker. This information is not intended to replace advice given to you by your health care provider. Make sure you discuss any questions you have with your health care provider. Document Released: 03/16/2008 Document Revised: 03/05/2016 Document Reviewed: 08/07/2015 Elsevier Interactive Patient Education  2018 [employer].

## 2017-09-08 ENCOUNTER — Ambulatory Visit: Payer: Self-pay | Admitting: Hematology

## 2017-09-08 NOTE — Telephone Encounter (Signed)
Daughter states patient has had a cough x 1week.  Now C/O of sore throat.  Patient is able to cough up mcuous that is yellow in nature.  Patient is also in remission from cancer Offered appointment for Monday with Almira Coaster.  Daughter refused.  Daughter refused to bring her to [facility] to another [facility] practice.  Daughter preferred to take her to urgent care.  This RN did provide home care instructions to the daughter. Reason for Disposition . Cough  Answer Assessment - Initial Assessment Questions 1. ONSET: "When did the cough begin?"      1 week ago 2. SEVERITY: "How bad is the cough today?"       Bad, coughs all the time 3. RESPIRATORY DISTRESS: "Describe your breathing."      No 4. FEVER: "Do you have a fever?" If so, ask: "What is your temperature, how was it measured, and when did it start?"   Not checked, no chills or periods of sweating 5. HEMOPTYSIS: "Are you coughing up any blood?" If so ask: "How much?" (flecks, streaks, tablespoons, etc.)     no 6. TREATMENT: "What have you done so far to treat the cough?" (e.g., meds, fluids, humidifier)     Generic claritain 7. CARDIAC HISTORY: "Do you have any history of heart disease?" (e.g., heart attack, congestive heart failure)      NO 8. LUNG HISTORY: "Do you have any history of lung disease?"  (e.g., pulmonary embolus, asthma, emphysema)     NO 9. PE RISK FACTORS: "Do you have a history of blood clots?" (or: recent major surgery, recent prolonged travel, bedridden )     NO 10. OTHER SYMPTOMS: "Do you have any other symptoms? (e.g., runny nose, wheezing, chest pain)       no 11. PREGNANCY: "Is there any chance you are pregnant?" "When was your last menstrual period?"      N/A 12. TRAVEL: "Have you traveled out of the country in the last month?" (e.g., travel history, exposures)      NO  Protocols used: COUGH - ACUTE NON-PRODUCTIVE-A-AH

## 2017-09-15 ENCOUNTER — Ambulatory Visit
Admission: RE | Admit: 2017-09-15 | Discharge: 2017-09-15 | Disposition: A | Discharge status: Home / Self Care | Payer: PPO | Source: Ambulatory Visit | Attending: Internal Medicine | Admitting: Internal Medicine

## 2017-09-15 DIAGNOSIS — C8581 Other specified types of non-Hodgkin lymphoma, lymph nodes of head, face, and neck: Secondary | ICD-10-CM | POA: Insufficient documentation

## 2017-09-15 DIAGNOSIS — C8330 Diffuse large B-cell lymphoma, unspecified site: Secondary | ICD-10-CM | POA: Diagnosis not present

## 2017-09-15 DIAGNOSIS — R911 Solitary pulmonary nodule: Secondary | ICD-10-CM | POA: Insufficient documentation

## 2017-09-15 LAB — GLUCOSE, CAPILLARY: GLUCOSE-CAPILLARY: 84 mg/dL (ref 65–99)

## 2017-09-15 MED ORDER — FLUDEOXYGLUCOSE F - 18 (FDG) INJECTION
13.0000 | Freq: Once | INTRAVENOUS | Status: AC | PRN
  Administered 2017-09-15: 13 via INTRAVENOUS

## 2017-09-17 ENCOUNTER — Encounter: Payer: Self-pay | Admitting: Internal Medicine

## 2017-09-17 ENCOUNTER — Other Ambulatory Visit: Payer: Self-pay

## 2017-09-17 ENCOUNTER — Inpatient Hospital Stay: Payer: PPO | Attending: Internal Medicine

## 2017-09-17 ENCOUNTER — Inpatient Hospital Stay (HOSPITAL_BASED_OUTPATIENT_CLINIC_OR_DEPARTMENT_OTHER): Payer: PPO | Admitting: Internal Medicine

## 2017-09-17 VITALS — BP 112/67 | HR 80 | Temp 97.8°F | Resp 12 | Ht 67.0 in | Wt 108.8 lb

## 2017-09-17 DIAGNOSIS — Z86718 Personal history of other venous thrombosis and embolism: Secondary | ICD-10-CM | POA: Diagnosis not present

## 2017-09-17 DIAGNOSIS — Z8042 Family history of malignant neoplasm of prostate: Secondary | ICD-10-CM | POA: Diagnosis not present

## 2017-09-17 DIAGNOSIS — Z7901 Long term (current) use of anticoagulants: Secondary | ICD-10-CM

## 2017-09-17 DIAGNOSIS — R911 Solitary pulmonary nodule: Secondary | ICD-10-CM | POA: Diagnosis not present

## 2017-09-17 DIAGNOSIS — Z8041 Family history of malignant neoplasm of ovary: Secondary | ICD-10-CM | POA: Insufficient documentation

## 2017-09-17 DIAGNOSIS — Z8551 Personal history of malignant neoplasm of bladder: Secondary | ICD-10-CM | POA: Diagnosis not present

## 2017-09-17 DIAGNOSIS — Z8572 Personal history of non-Hodgkin lymphomas: Secondary | ICD-10-CM

## 2017-09-17 DIAGNOSIS — C8581 Other specified types of non-Hodgkin lymphoma, lymph nodes of head, face, and neck: Secondary | ICD-10-CM

## 2017-09-17 DIAGNOSIS — Z801 Family history of malignant neoplasm of trachea, bronchus and lung: Secondary | ICD-10-CM

## 2017-09-17 LAB — COMPREHENSIVE METABOLIC PANEL
ALT: 12 U/L — ABNORMAL LOW (ref 14–54)
ANION GAP: 7 (ref 5–15)
AST: 16 U/L (ref 15–41)
Albumin: 3.8 g/dL (ref 3.5–5.0)
Alkaline Phosphatase: 69 U/L (ref 38–126)
BILIRUBIN TOTAL: 0.6 mg/dL (ref 0.3–1.2)
BUN: 33 mg/dL — ABNORMAL HIGH (ref 6–20)
CHLORIDE: 107 mmol/L (ref 101–111)
CO2: 27 mmol/L (ref 22–32)
Calcium: 8.9 mg/dL (ref 8.9–10.3)
Creatinine, Ser: 1.28 mg/dL — ABNORMAL HIGH (ref 0.44–1.00)
GFR calc Af Amer: 41 mL/min — ABNORMAL LOW (ref >60)
GFR, EST NON AFRICAN AMERICAN: 35 mL/min — AB (ref >60)
Glucose, Bld: 108 mg/dL — ABNORMAL HIGH (ref 65–99)
POTASSIUM: 4.4 mmol/L (ref 3.5–5.1)
Sodium: 141 mmol/L (ref 135–145)
TOTAL PROTEIN: 6.4 g/dL — AB (ref 6.5–8.1)

## 2017-09-17 LAB — CBC WITH DIFFERENTIAL/PLATELET
BASOS ABS: 0 10*3/uL (ref 0–0.1)
Basophils Relative: 1 %
Eosinophils Absolute: 0.1 10*3/uL (ref 0–0.7)
Eosinophils Relative: 2 %
HEMATOCRIT: 35.9 % (ref 35.0–47.0)
HEMOGLOBIN: 11.9 g/dL — AB (ref 12.0–16.0)
LYMPHS ABS: 1.1 10*3/uL (ref 1.0–3.6)
LYMPHS PCT: 19 %
MCH: 30.4 pg (ref 26.0–34.0)
MCHC: 33.1 g/dL (ref 32.0–36.0)
MCV: 91.8 fL (ref 80.0–100.0)
Monocytes Absolute: 0.5 10*3/uL (ref 0.2–0.9)
Monocytes Relative: 9 %
NEUTROS ABS: 3.9 10*3/uL (ref 1.4–6.5)
NEUTROS PCT: 69 %
PLATELETS: 193 10*3/uL (ref 150–440)
RBC: 3.91 MIL/uL (ref 3.80–5.20)
RDW: 13.9 % (ref 11.5–14.5)
WBC: 5.6 10*3/uL (ref 3.6–11.0)

## 2017-09-17 NOTE — Progress Notes (Signed)
[place] OFFICE PROGRESS NOTE  Patient Care Team: Lisa Haven, MD as PCP - General (Family Medicine) Lisa Lye, MD as Consulting Physician (General Surgery) System, Provider Not In  No matching staging information was found for the patient.   Oncology History   63.  83yo- year-old lady presented with large left neck mass and abdominal palpable mass.  Biopsy of left neck mass is being positive for diffuse B large cell lymphoma; Clinically staged as stage III (June, 2016) 2.  Patient did not want any chemotherapy so was started on prednisone followed by Rituxan in June of 2016 with excellent response 3.  Patient has finished 4 weekly cycle of rituximab on April 16, 2015 with excellent response  4. Because of bulky disease in the past patient is getting maintenance Rituxan therapy started in September of 2016; STOPPED in June 2018.   # R LE DVT- was found to have a deep vein thrombosis on xeralto. (March, 2017); And right-sided pulmonary emboli  # AUG 2017- PET- NED.       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  female pleasant patient above history of Diffuse large B-cell lymphoma on Rituxan is here for follow-up/To review the results of the PET scan.  She denies any falls. She continues in the blood thinners. Patient denies any weight loss. Denies night sweats. She continues to walk with a rolling walker. Patient denies any recent infections. No new lumps or bumps.No unusual night sweats.  No cough.  REVIEW OF SYSTEMS:  A complete 10 point review of system is done which is negative except mentioned  above/history of present illness.   PAST MEDICAL HISTORY :  Past Medical History:  Diagnosis Date  . Cancer [facility]) 2011   gallbladder  . Chickenpox   . Diffuse large B cell lymphoma ([place]) 03/17/2015  . Elevated blood pressure   . Glaucoma   . History of blood transfusion     PAST SURGICAL HISTORY :   Past Surgical History:  Procedure Laterality Date  . ABDOMINAL HYSTERECTOMY  1978   For fibroids  . BREAST BIOPSY Right 1990  . TRANSURETHRAL RESECTION OF BLADDER TUMOR  2011     FAMILY HISTORY :   Family History  Problem Relation Age of Onset  . Alcoholism Unknown   . Cancer - Lung Unknown        Parent  . Cancer - Ovarian Mother   . Prostate cancer Unknown        Other relative  . Stroke Unknown        Other relative   . High blood pressure Unknown        Other relative  . Diabetes Unknown        Other relative    SOCIAL HISTORY:   Social History   Tobacco Use  . Smoking status: Never Smoker  . Smokeless tobacco: Never Used  Substance Use Topics  . Alcohol use: No    Alcohol/week: 0.0 oz  . Drug use: No    ALLERGIES:  is allergic  to iron and codeine.  MEDICATIONS:  Current Outpatient Medications  Medication Sig Dispense Refill  . brimonidine (ALPHAGAN) 0.2 % ophthalmic solution     . latanoprost (XALATAN) 0.005 % ophthalmic solution     . methocarbamol (ROBAXIN) 500 MG tablet Take 500 mg by mouth.  1  . timolol (TIMOPTIC) 0.5 % ophthalmic solution     . XARELTO 10 MG TABS tablet TAKE 1 TABLET(10 MG) BY MOUTH DAILY 30 tablet 3   No current facility-administered medications for this visit.     PHYSICAL EXAMINATION: ECOG PERFORMANCE STATUS: 2 - Symptomatic, <50% confined to bed  BP 112/67 (BP Location: Left Arm, Patient Position: Sitting)   Pulse 80   Temp 97.8 F (36.6 C) (Tympanic)   Resp 12   Ht 5\' 7"  (1.702 m)   Wt 108 lb 12.8 oz (49.4 kg)   BMI 17.04 kg/m   Filed Weights   09/17/17 1449  Weight: 108 lb 12.8 oz (49.4 kg)    GENERAL: [name]   Elderly Caucasian female patient accompanied by daughter. She is able to sit on the exam table with great difficulty. Alert, no distress and comfortable.   She is walking with a walker. With her daughter.  EYES: no pallor or icterus OROPHARYNX: no thrush or ulceration; good dentition  NECK: supple, no masses felt LYMPH:  no palpable lymphadenopathy in the cervical, axillary or inguinal regions LUNGS: clear to auscultation and  No wheeze or crackles HEART/CVS: regular rate & rhythm and no murmurs; No lower extremity edema ABDOMEN:abdomen soft, non-tender and normal bowel sounds Musculoskeletal:no cyanosis of digits and no clubbing  PSYCH: alert & oriented x 3 with fluent speech NEURO: no focal motor/sensory deficits SKIN:  no rashes or significant lesions  LABORATORY DATA:  I have reviewed the data as listed    Component Value Date/Time   NA 141 09/17/2017 1412   K 4.4 09/17/2017 1412   CL 107 09/17/2017 1412   CO2 27 09/17/2017 1412   GLUCOSE 108 (H) 09/17/2017 1412   BUN 33 (H) 09/17/2017 1412   CREATININE 1.28 (H) 09/17/2017 1412   CALCIUM 8.9 09/17/2017 1412   PROT 6.4 (L) 09/17/2017 1412   ALBUMIN 3.8 09/17/2017 1412   AST 16 09/17/2017 1412   ALT 12 (L) 09/17/2017 1412   ALKPHOS 69 09/17/2017 1412   BILITOT 0.6 09/17/2017 1412   GFRNONAA 35 (L) 09/17/2017 1412   GFRAA 41 (L) 09/17/2017 1412    No results found for: SPEP, UPEP  Lab Results  Component Value Date   WBC 5.6 09/17/2017   NEUTROABS 3.9 09/17/2017   HGB 11.9 (L) 09/17/2017   HCT 35.9 09/17/2017   MCV 91.8 09/17/2017   PLT 193 09/17/2017      Chemistry      Component Value Date/Time   NA 141 09/17/2017 1412   K 4.4 09/17/2017 1412   CL 107 09/17/2017 1412   CO2 27 09/17/2017 1412   BUN 33 (H) 09/17/2017 1412   CREATININE 1.28 (H) 09/17/2017 1412      Component Value Date/Time   CALCIUM 8.9 09/17/2017 1412   ALKPHOS 69 09/17/2017 1412   AST 16 09/17/2017 1412   ALT 12 (L) 09/17/2017 1412    BILITOT 0.6 09/17/2017 1412     IMPRESSION: 1. No significant change from most recent study of 4 months ago. Low-level hypermetabolic activity within small right hilar lymph nodes is similar and favored to be reactive given stability. 2. No progressive hypermetabolic nodal activity or enlarging  lymph nodes demonstrated. 3. Stable small right upper lobe pulmonary nodule.   Electronically Signed   By: Richardean Sale M.D.   On: 09/15/2017 13:55   RADIOGRAPHIC STUDIES: I have personally reviewed the radiological images as listed and agreed with the findings in the report. No results found.   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 Clinically no evidence of progression noted. last Rituxan- June 2018.  PET- NOV 29th 2018- no evidence of lymphoma noted; however continued right hilar uptake.  Continue surveillance.   # Right LE DVT/ History of PE [March 2017]- on Xarelto 10 mg/day. No falls. Continue the same. Re-iterated the importance of avoiding falls.   # Right lung nodule- STABLE. Likely benign.   Follow up in 6 months/labs No infusion; PET prior.   # I reviewed the blood work- with the patient in detail; also reviewed the imaging independently [as summarized above]; and with the patient in detail.   # 25 minutes face-to-face with the patient discussing the above plan of care; more than 50% of time spent on prognosis/ natural history; counseling and coordination.  # I reviewed the blood work- with the patient in detail; also reviewed the imaging independently [as summarized above]; and with the patient in detail.    Orders Placed This Encounter  Procedures  . NM PET Image Restag (PS) Skull Base To Thigh    Standing Status:   Future    Standing Expiration Date:   09/17/2018    Scheduling Instructions:     Scan 1-2 days prior to Follow up in June 2019.    Order Specific Question:   If  indicated for the ordered procedure, I authorize the administration of a radiopharmaceutical per Radiology protocol    Answer:   Yes    Order Specific Question:   Preferred imaging location?    Answer:   [facility]    Order Specific Question:   Radiology Contrast Protocol - do NOT remove file path    Answer:   [email or web address]  . CBC with Differential    Standing Status:   Future    Standing Expiration Date:   09/17/2018  . Comprehensive metabolic panel    Standing Status:   Future    Standing Expiration Date:   09/17/2018   All questions were answered. The patient knows to call the clinic with any problems, questions or concerns.      Cammie Sickle, MD 09/17/2017 3:33 PM

## 2017-09-17 NOTE — Assessment & Plan Note (Addendum)
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 Clinically no evidence of progression noted. last Rituxan- June 2018.  PET- NOV 29th 2018- no evidence of lymphoma noted; however continued right hilar uptake.  Continue surveillance.   # Right LE DVT/ History of PE [March 2017]- on Xarelto 10 mg/day. No falls. Continue the same. Re-iterated the importance of avoiding falls.   # Right lung nodule- STABLE. Likely benign.   Follow up in 6 months/labs No infusion; PET prior.   # I reviewed the blood work- with the patient in detail; also reviewed the imaging independently [as summarized above]; and with the patient in detail.   # 25 minutes face-to-face with the patient discussing the above plan of care; more than 50% of time spent on prognosis/ natural history; counseling and coordination.

## 2017-09-17 NOTE — Progress Notes (Signed)
Patient here for follow up no changes since her last appt. 

## 2017-09-27 ENCOUNTER — Telehealth: Payer: Self-pay

## 2017-09-27 NOTE — Telephone Encounter (Signed)
Please advise 

## 2017-09-27 NOTE — Telephone Encounter (Signed)
Copied from [place] [id number]. Topic: General - Other >> Sep 27, 2017 11:53 AM Lisa Sosa, RMA wrote: Dentist stated that pt needs to be taken off of xalerto 10 mg but needs to know for how long for dental surgery before and after extraction  please call pt at [phone]

## 2017-09-28 NOTE — Telephone Encounter (Signed)
This medication is being managed by oncology. I would advise her to contact them for directions on this particularly given prior history of DVT and lymphoma.

## 2017-09-29 ENCOUNTER — Telehealth: Payer: Self-pay | Admitting: *Deleted

## 2017-09-29 NOTE — Telephone Encounter (Signed)
Lisa Sosa informed of restarting Xarelto 3 days after extraction. She repeated back to me

## 2017-09-29 NOTE — Telephone Encounter (Signed)
Patient and daughter informed.

## 2017-09-29 NOTE — Telephone Encounter (Signed)
Daughter called reporting that patient needs a tooth pulled and she is on Xarelto. She is asking how long she needs to be off of her Xarelto before she can have her tooth pulled. Please advise

## 2017-09-29 NOTE — Telephone Encounter (Signed)
Per Dr Rogue Bussing, stop for 4 days before extraction. Naomi informed and asked how long after extraction before she restarts it

## 2017-09-29 NOTE — Telephone Encounter (Signed)
Patient can start back Xarelto 3 days after tooth extraction.

## 2017-11-29 DIAGNOSIS — H401112 Primary open-angle glaucoma, right eye, moderate stage: Secondary | ICD-10-CM | POA: Diagnosis not present

## 2018-01-23 ENCOUNTER — Other Ambulatory Visit: Payer: Self-pay | Admitting: Internal Medicine

## 2018-01-23 DIAGNOSIS — I2699 Other pulmonary embolism without acute cor pulmonale: Secondary | ICD-10-CM

## 2018-01-23 DIAGNOSIS — C8339 Diffuse large B-cell lymphoma, extranodal and solid organ sites: Secondary | ICD-10-CM

## 2018-02-07 ENCOUNTER — Other Ambulatory Visit: Payer: Self-pay | Admitting: Internal Medicine

## 2018-02-07 DIAGNOSIS — C8339 Diffuse large B-cell lymphoma, extranodal and solid organ sites: Secondary | ICD-10-CM

## 2018-02-07 DIAGNOSIS — I2699 Other pulmonary embolism without acute cor pulmonale: Secondary | ICD-10-CM

## 2018-02-21 DIAGNOSIS — H401123 Primary open-angle glaucoma, left eye, severe stage: Secondary | ICD-10-CM | POA: Diagnosis not present

## 2018-02-28 DIAGNOSIS — H401133 Primary open-angle glaucoma, bilateral, severe stage: Secondary | ICD-10-CM | POA: Diagnosis not present

## 2018-03-01 ENCOUNTER — Telehealth: Payer: Self-pay | Admitting: Family Medicine

## 2018-03-01 NOTE — Telephone Encounter (Signed)
She could be put on the schedule next Tuesday at 9 AM.

## 2018-03-01 NOTE — Telephone Encounter (Signed)
The patient's daughter is aware of the appointment.

## 2018-03-01 NOTE — Telephone Encounter (Signed)
Copied from [place] [id number]. Topic: Appointment Scheduling - Prior Auth Required for Appointment >> Mar 01, 2018  4:11 PM Lisa Sosa wrote: Patient's daughter Lisa Sosa would like her to be seen before the next available which was June 7th at 8 am. She can not come that day as she is having a PET scan, I offered her the next one after that and it was July 7th. She is wanting to move her into a nursing facility as soon as she can due to her husband being diagnosed with cancer. She said that she can not take care of both of them. Would Dr Caryl Bis be willing to work her in before July? Please call her at [phone].

## 2018-03-01 NOTE — Telephone Encounter (Signed)
Please advise 

## 2018-03-08 ENCOUNTER — Ambulatory Visit: Payer: PPO | Admitting: Family Medicine

## 2018-03-09 ENCOUNTER — Telehealth: Payer: Self-pay | Admitting: Family Medicine

## 2018-03-09 NOTE — Telephone Encounter (Unsigned)
Copied from [place] [id number]. Topic: Quick Communication - See Telephone Encounter >> Mar 09, 2018  1:22 PM Percell Belt A wrote: CRM for notification. See Telephone encounter for: 03/09/18. Daughter called in and said pt is needing an appt to see DR Caryl Bis for to get her into a assisted living, daughters husband has cancer and she can no longer take care of mother and husband at the same time

## 2018-03-09 NOTE — Telephone Encounter (Signed)
Please advise 

## 2018-03-09 NOTE — Telephone Encounter (Signed)
Please advise when to schedule 

## 2018-03-09 NOTE — Telephone Encounter (Signed)
She can be placed in at 12 pm on a Tuesday that I am in the office in June or July. Or she can could be placed in a hospital follow-up slot. Thanks.

## 2018-03-09 NOTE — Telephone Encounter (Signed)
scheduled

## 2018-03-18 ENCOUNTER — Ambulatory Visit
Admission: RE | Admit: 2018-03-18 | Discharge: 2018-03-18 | Disposition: A | Discharge status: Home / Self Care | Payer: PPO | Source: Ambulatory Visit | Attending: Internal Medicine | Admitting: Internal Medicine

## 2018-03-18 DIAGNOSIS — R911 Solitary pulmonary nodule: Secondary | ICD-10-CM | POA: Diagnosis not present

## 2018-03-18 DIAGNOSIS — I700 Atherosclerosis of aorta: Secondary | ICD-10-CM | POA: Diagnosis not present

## 2018-03-18 DIAGNOSIS — C8581 Other specified types of non-Hodgkin lymphoma, lymph nodes of head, face, and neck: Secondary | ICD-10-CM | POA: Diagnosis not present

## 2018-03-18 DIAGNOSIS — C8330 Diffuse large B-cell lymphoma, unspecified site: Secondary | ICD-10-CM | POA: Diagnosis not present

## 2018-03-18 LAB — GLUCOSE, CAPILLARY: GLUCOSE-CAPILLARY: 76 mg/dL (ref 65–99)

## 2018-03-18 MED ORDER — FLUDEOXYGLUCOSE F - 18 (FDG) INJECTION
5.8300 | Freq: Once | INTRAVENOUS | Status: AC | PRN
  Administered 2018-03-18: 5.83 via INTRAVENOUS

## 2018-03-21 ENCOUNTER — Other Ambulatory Visit: Payer: Self-pay

## 2018-03-21 ENCOUNTER — Inpatient Hospital Stay: Payer: PPO | Attending: Oncology

## 2018-03-21 ENCOUNTER — Encounter: Payer: Self-pay | Admitting: Oncology

## 2018-03-21 ENCOUNTER — Inpatient Hospital Stay (HOSPITAL_BASED_OUTPATIENT_CLINIC_OR_DEPARTMENT_OTHER): Payer: PPO | Admitting: Oncology

## 2018-03-21 VITALS — BP 127/58 | HR 70 | Temp 96.1°F | Resp 18 | Wt 104.6 lb

## 2018-03-21 DIAGNOSIS — I10 Essential (primary) hypertension: Secondary | ICD-10-CM

## 2018-03-21 DIAGNOSIS — Z8042 Family history of malignant neoplasm of prostate: Secondary | ICD-10-CM | POA: Insufficient documentation

## 2018-03-21 DIAGNOSIS — Z7901 Long term (current) use of anticoagulants: Secondary | ICD-10-CM | POA: Diagnosis not present

## 2018-03-21 DIAGNOSIS — Z8551 Personal history of malignant neoplasm of bladder: Secondary | ICD-10-CM | POA: Insufficient documentation

## 2018-03-21 DIAGNOSIS — Z8572 Personal history of non-Hodgkin lymphomas: Secondary | ICD-10-CM | POA: Insufficient documentation

## 2018-03-21 DIAGNOSIS — Z801 Family history of malignant neoplasm of trachea, bronchus and lung: Secondary | ICD-10-CM

## 2018-03-21 DIAGNOSIS — R918 Other nonspecific abnormal finding of lung field: Secondary | ICD-10-CM | POA: Insufficient documentation

## 2018-03-21 DIAGNOSIS — R634 Abnormal weight loss: Secondary | ICD-10-CM | POA: Diagnosis not present

## 2018-03-21 DIAGNOSIS — C83398 Diffuse large b-cell lymphoma of other extranodal and solid organ sites: Secondary | ICD-10-CM

## 2018-03-21 DIAGNOSIS — C8339 Diffuse large B-cell lymphoma, extranodal and solid organ sites: Secondary | ICD-10-CM

## 2018-03-21 DIAGNOSIS — Z79899 Other long term (current) drug therapy: Secondary | ICD-10-CM | POA: Diagnosis not present

## 2018-03-21 DIAGNOSIS — C8581 Other specified types of non-Hodgkin lymphoma, lymph nodes of head, face, and neck: Secondary | ICD-10-CM

## 2018-03-21 LAB — COMPREHENSIVE METABOLIC PANEL
ALK PHOS: 49 U/L (ref 38–126)
ALT: 17 U/L (ref 14–54)
ANION GAP: 6 (ref 5–15)
AST: 21 U/L (ref 15–41)
Albumin: 3.7 g/dL (ref 3.5–5.0)
BILIRUBIN TOTAL: 0.6 mg/dL (ref 0.3–1.2)
BUN: 36 mg/dL — ABNORMAL HIGH (ref 6–20)
CALCIUM: 9 mg/dL (ref 8.9–10.3)
CO2: 25 mmol/L (ref 22–32)
CREATININE: 1.14 mg/dL — AB (ref 0.44–1.00)
Chloride: 110 mmol/L (ref 101–111)
GFR, EST AFRICAN AMERICAN: 47 mL/min — AB (ref >60)
GFR, EST NON AFRICAN AMERICAN: 40 mL/min — AB (ref >60)
Glucose, Bld: 103 mg/dL — ABNORMAL HIGH (ref 65–99)
Potassium: 4.8 mmol/L (ref 3.5–5.1)
SODIUM: 141 mmol/L (ref 135–145)
TOTAL PROTEIN: 6 g/dL — AB (ref 6.5–8.1)

## 2018-03-21 LAB — CBC WITH DIFFERENTIAL/PLATELET
Basophils Absolute: 0 10*3/uL (ref 0–0.1)
Basophils Relative: 1 %
EOS ABS: 0.1 10*3/uL (ref 0–0.7)
Eosinophils Relative: 3 %
HCT: 33.2 % — ABNORMAL LOW (ref 35.0–47.0)
HEMOGLOBIN: 11.3 g/dL — AB (ref 12.0–16.0)
LYMPHS ABS: 0.9 10*3/uL — AB (ref 1.0–3.6)
LYMPHS PCT: 22 %
MCH: 31.1 pg (ref 26.0–34.0)
MCHC: 33.9 g/dL (ref 32.0–36.0)
MCV: 91.8 fL (ref 80.0–100.0)
MONOS PCT: 9 %
Monocytes Absolute: 0.4 10*3/uL (ref 0.2–0.9)
NEUTROS PCT: 65 %
Neutro Abs: 2.7 10*3/uL (ref 1.4–6.5)
Platelets: 161 10*3/uL (ref 150–440)
RBC: 3.62 MIL/uL — ABNORMAL LOW (ref 3.80–5.20)
RDW: 14.1 % (ref 11.5–14.5)
WBC: 4.2 10*3/uL (ref 3.6–11.0)

## 2018-03-21 NOTE — Progress Notes (Signed)
Patient here today for follow up. No concerns voiced.  °

## 2018-03-21 NOTE — Progress Notes (Signed)
[place] OFFICE PROGRESS NOTE  Patient Care Team: [name], MD as PCP - General (Family Medicine) Christene Lye, MD as Consulting Physician (General Surgery) System, Provider Not In    Oncology History   108.  84yo- year-old lady presented with large left neck mass and abdominal palpable mass.  Biopsy of left neck mass is being positive for diffuse B large cell lymphoma; Clinically staged as stage III (June, 2016) 2.  Patient did not want any chemotherapy so was started on prednisone followed by Rituxan in June of 2016 with excellent response 3.  Patient has finished 4 weekly cycle of rituximab on April 16, 2015 with excellent response  4. Because of bulky disease in the past patient is getting maintenance Rituxan therapy started in September of 2016; STOPPED in June 2018.   # R LE DVT- was found to have a deep vein thrombosis on xeralto. (March, 2017); And right-sided pulmonary emboli  # AUG 2017- PET- NED.       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  female pleasant patient above history of DLBCL treated with Rituximab [ stopped in June 2018] presents for follow up.   # Fatigue, chronic stable.  # Appetite: does not eat much.  # weight loss: decreased 4 pounds comparing to last visit.  No new lumps or bumps. No unusual night sweats.    Review of Systems  Constitutional: Positive for weight loss. Negative for chills, fever and malaise/fatigue.  HENT: Negative for congestion, ear discharge, ear pain, nosebleeds, sinus pain and sore throat.   Eyes: Negative for double vision, photophobia, pain,  discharge and redness.  Respiratory: Negative for cough, hemoptysis, sputum production, shortness of breath and wheezing.   Cardiovascular: Negative for chest pain, palpitations, orthopnea, claudication and leg swelling.  Gastrointestinal: Negative for abdominal pain, blood in stool, constipation, diarrhea, heartburn, melena, nausea and vomiting.  Genitourinary: Negative for dysuria, flank pain, frequency and hematuria.  Musculoskeletal: Negative for back pain, myalgias and neck pain.  Skin: Negative for itching and rash.  Neurological: Negative for dizziness, tingling, tremors, focal weakness, weakness and headaches.  Endo/Heme/Allergies: Negative for environmental allergies. Does not bruise/bleed easily.  Psychiatric/Behavioral: Negative for depression and hallucinations. The patient is not nervous/anxious.     PAST MEDICAL HISTORY :  Past Medical History:  Diagnosis Date  . Cancer [facility]) 2011   gallbladder  . Chickenpox   . Diffuse large B cell lymphoma ([place]) 03/17/2015  . Elevated blood pressure   . Glaucoma   . History of blood transfusion     PAST SURGICAL HISTORY :   Past Surgical History:  Procedure Laterality Date  . ABDOMINAL HYSTERECTOMY  1978   For fibroids  . BREAST BIOPSY Right 1990  . TRANSURETHRAL RESECTION OF BLADDER TUMOR  2011     FAMILY HISTORY :   Family History  Problem Relation Age of Onset  . Alcoholism Unknown   . Cancer - Lung Unknown        Parent  . Cancer - Ovarian Mother   . Prostate  cancer Unknown        Other relative  . Stroke Unknown        Other relative   . High blood pressure Unknown        Other relative  . Diabetes Unknown        Other relative    SOCIAL HISTORY:   Social History   Tobacco Use  . Smoking status: Never Smoker  . Smokeless tobacco: Never Used  Substance Use Topics  . Alcohol use: No    Alcohol/week: 0.0 oz  . Drug use: No    ALLERGIES:  is allergic to iron and codeine.  MEDICATIONS:  Current Outpatient  Medications  Medication Sig Dispense Refill  . brimonidine (ALPHAGAN) 0.2 % ophthalmic solution     . latanoprost (XALATAN) 0.005 % ophthalmic solution     . timolol (TIMOPTIC) 0.5 % ophthalmic solution     . XARELTO 10 MG TABS tablet TAKE 1 TABLET(10 MG) BY MOUTH DAILY 30 tablet 0  . methocarbamol (ROBAXIN) 500 MG tablet Take 500 mg by mouth.  1   No current facility-administered medications for this visit.     PHYSICAL EXAMINATION: ECOG PERFORMANCE STATUS: 2 - Symptomatic, <50% confined to bed  BP (!) 127/58 (BP Location: Left Arm, Patient Position: Sitting)   Pulse 70   Temp (!) 96.1 F (35.6 C)   Resp 18   Wt 104 lb 9.6 oz (47.4 kg)   BMI 16.38 kg/m   Filed Weights   03/21/18 1427  Weight: 104 lb 9.6 oz (47.4 kg)    Physical Exam  Constitutional: She is oriented to person, place, and time and well-developed, well-nourished, and in no distress. No distress.  Frail  elderly Caucasian female patient accompanied by daughter  HENT:  Head: Normocephalic and atraumatic.  Nose: Nose normal.  Mouth/Throat: Oropharynx is clear and moist. No oropharyngeal exudate.  Eyes: Pupils are equal, round, and reactive to light. EOM are normal. No scleral icterus.  Neck: Normal range of motion. Neck supple.  Cardiovascular: Normal rate, regular rhythm and normal heart sounds.  No murmur heard. Pulmonary/Chest: Effort normal and breath sounds normal. No respiratory distress. She has no wheezes.  Abdominal: Soft. She exhibits no distension and no mass. There is no tenderness.  Musculoskeletal: Normal range of motion. She exhibits no edema or tenderness.  Lymphadenopathy:    She has no cervical adenopathy.  Neurological: She is alert and oriented to person, place, and time. No cranial nerve deficit. She exhibits normal muscle tone.  Skin: Skin is warm and dry. She is not diaphoretic. No erythema.  Psychiatric: Affect normal.   LABORATORY DATA:  I have reviewed the data as listed     Component Value Date/Time   NA 141 03/21/2018 1410   K 4.8 03/21/2018 1410   CL 110 03/21/2018 1410   CO2 25 03/21/2018 1410   GLUCOSE 103 (H) 03/21/2018 1410   BUN 36 (H) 03/21/2018 1410   CREATININE 1.14 (H) 03/21/2018 1410   CALCIUM 9.0 03/21/2018 1410   PROT 6.0 (L) 03/21/2018 1410   ALBUMIN 3.7 03/21/2018 1410   AST 21 03/21/2018 1410   ALT 17 03/21/2018 1410   ALKPHOS 49 03/21/2018 1410   BILITOT 0.6 03/21/2018 1410   GFRNONAA 40 (L) 03/21/2018 1410   GFRAA 47 (L) 03/21/2018 1410    No results found for: SPEP, UPEP  Lab Results  Component Value Date   WBC 4.2 03/21/2018   NEUTROABS 2.7 03/21/2018   HGB 11.3 (L)  03/21/2018   HCT 33.2 (L) 03/21/2018   MCV 91.8 03/21/2018   PLT 161 03/21/2018      Chemistry      Component Value Date/Time   NA 141 03/21/2018 1410   K 4.8 03/21/2018 1410   CL 110 03/21/2018 1410   CO2 25 03/21/2018 1410   BUN 36 (H) 03/21/2018 1410   CREATININE 1.14 (H) 03/21/2018 1410      Component Value Date/Time   CALCIUM 9.0 03/21/2018 1410   ALKPHOS 49 03/21/2018 1410   AST 21 03/21/2018 1410   ALT 17 03/21/2018 1410   BILITOT 0.6 03/21/2018 1410       RADIOGRAPHIC STUDIES: I have personally reviewed the radiological images as listed and agreed with the findings in the report. 03/18/2018 PET scan IMPRESSION: 1. No substantial interval change. Persistent low level foci of hypermetabolism in the right hilum without underlying lymphadenopathy evident. Interval stability is reassuring. 2. No new hypermetabolic disease in the neck, chest, abdomen, or pelvis. 3. Stable right upper lobe pulmonary nodule. 4.  Aortic Atherosclerois (ICD10-170.0)  ASSESSMENT & PLAN:  1. Diffuse large B-cell lymphoma of solid organ excluding spleen (HCC)    PET scan was independently reviewed which revealed stable disease. No signs of disease recurrence.  Discussed with patient and her daughter.  Labs reviewed and discussed.  Continue surveillance. Check  CBC and CMP in 6 months with Dr.Brahmanday.     Orders Placed This Encounter  Procedures  . NM PET Image Restag (PS) Skull Base To Thigh    Standing Status:   Future    Standing Expiration Date:   03/21/2019    Order Specific Question:   If indicated for the ordered procedure, I authorize the administration of a radiopharmaceutical per Radiology protocol    Answer:   Yes    Order Specific Question:   Preferred imaging location?    Answer:   [facility]    Order Specific Question:   Radiology Contrast Protocol - do NOT remove file path    Answer:   [email or web address]   All questions were answered. The patient knows to call the clinic with any problems, questions or concerns.      Earlie Server, MD 03/21/2018 9:26 PM

## 2018-03-23 ENCOUNTER — Encounter: Payer: Self-pay | Admitting: Family Medicine

## 2018-03-23 ENCOUNTER — Ambulatory Visit (INDEPENDENT_AMBULATORY_CARE_PROVIDER_SITE_OTHER): Payer: PPO | Admitting: Family Medicine

## 2018-03-23 VITALS — BP 94/58 | HR 75 | Temp 97.5°F | Ht 67.0 in | Wt 103.4 lb

## 2018-03-23 DIAGNOSIS — Z111 Encounter for screening for respiratory tuberculosis: Secondary | ICD-10-CM

## 2018-03-23 DIAGNOSIS — Z86711 Personal history of pulmonary embolism: Secondary | ICD-10-CM

## 2018-03-23 DIAGNOSIS — F320 Major depressive disorder, single episode, mild: Secondary | ICD-10-CM

## 2018-03-23 DIAGNOSIS — I9589 Other hypotension: Secondary | ICD-10-CM | POA: Diagnosis not present

## 2018-03-23 DIAGNOSIS — E861 Hypovolemia: Secondary | ICD-10-CM | POA: Diagnosis not present

## 2018-03-23 DIAGNOSIS — C8581 Other specified types of non-Hodgkin lymphoma, lymph nodes of head, face, and neck: Secondary | ICD-10-CM | POA: Diagnosis not present

## 2018-03-23 DIAGNOSIS — R634 Abnormal weight loss: Secondary | ICD-10-CM

## 2018-03-23 LAB — CBC
HCT: 34.1 % — ABNORMAL LOW (ref 36.0–46.0)
HEMOGLOBIN: 11.5 g/dL — AB (ref 12.0–15.0)
MCHC: 33.8 g/dL (ref 30.0–36.0)
MCV: 91.9 fl (ref 78.0–100.0)
PLATELETS: 177 10*3/uL (ref 150.0–400.0)
RBC: 3.71 Mil/uL — ABNORMAL LOW (ref 3.87–5.11)
RDW: 14.3 % (ref 11.5–15.5)
WBC: 5.2 10*3/uL (ref 4.0–10.5)

## 2018-03-23 LAB — BASIC METABOLIC PANEL
BUN: 32 mg/dL — AB (ref 6–23)
CO2: 28 mEq/L (ref 19–32)
Calcium: 9.5 mg/dL (ref 8.4–10.5)
Chloride: 105 mEq/L (ref 96–112)
Creatinine, Ser: 1.27 mg/dL — ABNORMAL HIGH (ref 0.40–1.20)
GFR: 41.74 mL/min — ABNORMAL LOW (ref >60.00)
GLUCOSE: 156 mg/dL — AB (ref 70–99)
POTASSIUM: 4.6 meq/L (ref 3.5–5.1)
SODIUM: 140 meq/L (ref 135–145)

## 2018-03-23 NOTE — Assessment & Plan Note (Signed)
Weight has trended down slightly further.  I suspect this is related to poor oral intake.  She is been evaluated by oncology.  She is no longer depressed.  Discussed high calorie foods and getting 3 good meals a day.

## 2018-03-23 NOTE — Assessment & Plan Note (Signed)
Asymptomatic.  Xarelto is refilled through her oncologist.

## 2018-03-23 NOTE — Assessment & Plan Note (Addendum)
She has had poor PO intake. This is likely the cause of her low BP initially in the office. She has mild light headedness that is orthostasis. She was given juice and water in the office and her BP trended up. I discussed appropriate hydration strategies. We will check labs to evaluate renal function and anemia.  They will check her blood pressure daily for the next several days and contact us with the numbers.

## 2018-03-23 NOTE — Progress Notes (Signed)
  Tommi Rumps, MD Phone: [phone]  Lisa Sosa is a 84 y.o. female who presents today for f/u.  CC: depression, history PE, lymphoma, forms for ALF  Denies depression.  Blood pressure typically runs around 341 systolically.  Occasionally it will be 90/60.  She only has a little lightheadedness in the morning when she first gets up.  She has not been drinking many fluids or eating all that well recently. She typically only drinks caffeinated tea and coffee at home.  She has had no chest pain or shortness of breath. She's had no fevers. She does not feel ill.   She has a history of PE.  She is been on Xarelto.  She said no bleeding issues.  She continues to see oncology given her history of lymphoma.  She had a recent PET scan that was unchanged.  Her weights been down slightly.  Social History   Tobacco Use  Smoking Status Never Smoker  Smokeless Tobacco Never Used     ROS see history of present illness  Objective  Physical Exam Vitals:   03/23/18 1130 03/23/18 1238  BP: (!) 76/42 (!) 94/58  Pulse: 75   Temp: (!) 97.5 F (36.4 C)   SpO2: 97%     BP Readings from Last 3 Encounters:  03/23/18 (!) 94/58  03/21/18 (!) 127/58  09/17/17 112/67   Wt Readings from Last 3 Encounters:  03/23/18 103 lb 6.4 oz (46.9 kg)  03/21/18 104 lb 9.6 oz (47.4 kg)  09/17/17 108 lb 12.8 oz (49.4 kg)    Physical Exam  Constitutional: No distress.  Cardiovascular: Normal rate, regular rhythm and normal heart sounds.  Pulmonary/Chest: Effort normal and breath sounds normal.  Musculoskeletal: She exhibits no edema.  Neurological: She is alert.  Skin: Skin is warm and dry. She is not diaphoretic.  She does have skin tenting at her left clavicle   Assessment/Plan: Please see individual problem list.  Depression, major, single episode, mild (HCC) Asymptomatic.  Monitor for recurrence.  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  History of pulmonary embolus (PE) Asymptomatic.  Xarelto is refilled through her oncologist.  Weight loss Weight has trended down slightly further.  I suspect this is related to poor oral intake.  She is been evaluated by oncology.  She is no longer depressed.  Discussed high calorie foods and getting 3 good meals a day.  Hypotension due to hypovolemia She has had poor PO intake. This is likely the cause of her low BP initially in the office. She has mild light headedness that is orthostasis. She was given juice and water in the office and her BP trended up. I discussed appropriate hydration strategies. We will check labs to evaluate renal function and anemia.  They will check her blood pressure daily for the next several days and contact us with the numbers.  PPD placed for ALF placement. Forms to be completed once PPD read  Orders Placed This Encounter  Procedures  . Basic Metabolic Panel (BMET)  . CBC  . TB Skin Test    Order Specific Question:   Has patient ever tested positive?    Answer:   No    No orders of the defined types were placed in this encounter.    Tommi Rumps, MD [place]

## 2018-03-23 NOTE — Assessment & Plan Note (Signed)
Asymptomatic.  Monitor for recurrence. 

## 2018-03-23 NOTE — Patient Instructions (Addendum)
Nice to see you.  You need to make sure you are drinking plenty of water. Please try to get 8 glasses of water per day. You need to decrease your caffeine intake as well.  We will check labs today.  Please check your blood pressure daily for the next several days and contact us with the results.

## 2018-03-23 NOTE — Assessment & Plan Note (Signed)
She will continue to follow with oncology. 

## 2018-03-25 ENCOUNTER — Telehealth: Payer: Self-pay

## 2018-03-25 ENCOUNTER — Ambulatory Visit: Payer: PPO

## 2018-03-25 DIAGNOSIS — Z111 Encounter for screening for respiratory tuberculosis: Secondary | ICD-10-CM

## 2018-03-25 LAB — TB SKIN TEST
Induration: 0 mm
TB SKIN TEST: NEGATIVE

## 2018-03-25 NOTE — Telephone Encounter (Signed)
It looks like the patient needs a 2 step TB test.  Please get her set up for this.  Thanks.

## 2018-03-25 NOTE — Telephone Encounter (Signed)
Patients daughter brought in BP readings today. 03-24-18 - 150/80 03-25-18-  106/51

## 2018-03-25 NOTE — Progress Notes (Signed)
Patient is here to have PPD read.PPD is negative ( 4mm) in right forearm.

## 2018-03-28 NOTE — Telephone Encounter (Signed)
Tried calling, no voicemail. [place] for pec to speak to patient or daughter to schedule a nurse visit for tb skin test. Patient will have to come back in 48-72 hours to have skin test read.

## 2018-03-29 NOTE — Telephone Encounter (Signed)
scheduled

## 2018-03-31 ENCOUNTER — Telehealth: Payer: Self-pay | Admitting: *Deleted

## 2018-03-31 NOTE — Telephone Encounter (Signed)
Copied from [place] [id number]. Topic: General - Other >> Mar 30, 2018  5:05 PM Valla Leaver wrote: Reason for CRM: Lattie Haw, at [facility] assisted living calling to check if the faxfor FL2 to be filled out and an addendum inquiring about diet and allergies has been received and filled out by Dr. Caryl Bis?? Please call back and advise.

## 2018-03-31 NOTE — Telephone Encounter (Signed)
Have you received this?

## 2018-03-31 NOTE — Telephone Encounter (Signed)
I have this.  This is the form that needs the two-step PPD.  I cannot complete it until that is finished.  Thanks.

## 2018-03-31 NOTE — Telephone Encounter (Signed)
The form will be ready to fax tomorrow.

## 2018-03-31 NOTE — Telephone Encounter (Signed)
Lisa Sosa from [place] states she does not need the second one because they can do the second one at [email or web address].she would like form faxed as soon as possible Fax: [phone]

## 2018-04-01 NOTE — Telephone Encounter (Signed)
faxed

## 2018-04-05 ENCOUNTER — Other Ambulatory Visit: Payer: Self-pay | Admitting: Internal Medicine

## 2018-04-05 DIAGNOSIS — C8339 Diffuse large B-cell lymphoma, extranodal and solid organ sites: Secondary | ICD-10-CM

## 2018-04-05 DIAGNOSIS — I2699 Other pulmonary embolism without acute cor pulmonale: Secondary | ICD-10-CM

## 2018-04-06 ENCOUNTER — Ambulatory Visit: Payer: PPO

## 2018-04-13 ENCOUNTER — Telehealth: Payer: Self-pay | Admitting: Family Medicine

## 2018-04-13 NOTE — Telephone Encounter (Signed)
Pt's daughter dropped off [place] papers to be completed by Dr.Sonnenberg. Papers are up front in colored folder. Please call Delcie Roch, daughter when ready for pick up, phone number [phone].

## 2018-04-13 NOTE — Telephone Encounter (Signed)
Placed on Dr. Sonnenberg's desk  

## 2018-04-19 NOTE — Telephone Encounter (Signed)
It looks like the form requires parts of an examination that we did not do during her recent office visit.  I would like to have her return to complete this evaluation and then we can sign off on the form.  Thanks.

## 2018-04-20 NOTE — Telephone Encounter (Signed)
Patient is scheduled for this friday

## 2018-04-22 ENCOUNTER — Ambulatory Visit: Payer: PPO | Admitting: Family Medicine

## 2018-04-29 ENCOUNTER — Encounter: Payer: Self-pay | Admitting: Family Medicine

## 2018-04-29 ENCOUNTER — Ambulatory Visit (INDEPENDENT_AMBULATORY_CARE_PROVIDER_SITE_OTHER): Payer: PPO | Admitting: Family Medicine

## 2018-04-29 VITALS — BP 134/60 | HR 65 | Temp 98.3°F | Resp 14 | Ht 67.0 in | Wt 111.0 lb

## 2018-04-29 DIAGNOSIS — Z0289 Encounter for other administrative examinations: Secondary | ICD-10-CM | POA: Diagnosis not present

## 2018-04-29 DIAGNOSIS — M436 Torticollis: Secondary | ICD-10-CM | POA: Diagnosis not present

## 2018-04-29 DIAGNOSIS — N3941 Urge incontinence: Secondary | ICD-10-CM | POA: Diagnosis not present

## 2018-04-29 NOTE — Patient Instructions (Signed)
Nice to see you. We will get your form filled out.  If you have not heard anything by the middle of next week please contact us.

## 2018-05-03 DIAGNOSIS — Z0289 Encounter for other administrative examinations: Secondary | ICD-10-CM | POA: Insufficient documentation

## 2018-05-03 DIAGNOSIS — M436 Torticollis: Secondary | ICD-10-CM | POA: Insufficient documentation

## 2018-05-03 NOTE — Assessment & Plan Note (Signed)
Chronic and stable.   

## 2018-05-03 NOTE — Progress Notes (Signed)
  Tommi Rumps, MD Phone: [phone]  Lisa Sosa is a 84 y.o. female who presents today for f/u.  CC: [employer] benefits form to be filled out  Patient is able to feed herself.  She does not prepare her own meals.  She needs assistance with bathing and tending other hygiene needs due to risk of falls.  She is not legally blind.  She will reside in assisted living facility.  She does require medication management.  She manages her benefit plan payments with the assistance of her family.  She notes chronically stiff neck with some discomfort that does radiate to her right arm at times.  No numbness or weakness.  She does require a walker when walking.  She does have mild urinary incontinence if she waits too long to urinate.  She does have intermittent mild dizziness in the morning where she feels swimmy headed.  She typically notes this occurs when she gets up out of bed.  Social History   Tobacco Use  Smoking Status Never Smoker  Smokeless Tobacco Never Used     ROS see history of present illness  Objective  Physical Exam Vitals:   04/29/18 1514  BP: 134/60  Pulse: 65  Resp: 14  Temp: 98.3 F (36.8 C)  SpO2: 96%    BP Readings from Last 3 Encounters:  04/29/18 134/60  03/23/18 (!) 94/58  03/21/18 (!) 127/58   Wt Readings from Last 3 Encounters:  04/29/18 111 lb (50.3 kg)  03/23/18 103 lb 6.4 oz (46.9 kg)  03/21/18 104 lb 9.6 oz (47.4 kg)    Physical Exam  Constitutional: No distress.  Cardiovascular: Normal rate, regular rhythm and normal heart sounds.  Pulmonary/Chest: Effort normal and breath sounds normal.  Musculoskeletal: She exhibits no edema.  No midline neck tenderness, no midline neck step-off, 5/5 strength in bilateral biceps, triceps, grip, quads, hamstrings, plantar and dorsiflexion, sensation to light touch intact in bilateral UE and LE, appears to have normal gait with use of walker  Neurological: She is alert.  Skin: Skin is warm and dry. She is not  diaphoretic.     Assessment/Plan: Please see individual problem list.  Urge incontinence of urine Chronic and stable.  Neck stiffness Likely arthritis related.  Benign exam.  She will monitor.  Encounter for completion of form with patient Form will be completed for patient.   No orders of the defined types were placed in this encounter.   No orders of the defined types were placed in this encounter.    Tommi Rumps, MD [place]

## 2018-05-03 NOTE — Assessment & Plan Note (Signed)
Form will be completed for patient.

## 2018-05-03 NOTE — Assessment & Plan Note (Signed)
Likely arthritis related.  Benign exam.  She will monitor.

## 2018-05-04 ENCOUNTER — Telehealth: Payer: Self-pay

## 2018-05-04 NOTE — Telephone Encounter (Signed)
Copied from [place] [id number]. Topic: Quick Communication - See Telephone Encounter >> May 04, 2018 11:13 AM Marja Kays F wrote: Checking on the status a VA benefits form that patient dropped off last Friday when she saw Dr. Caryl Bis   Best number [phone]

## 2018-05-04 NOTE — Telephone Encounter (Signed)
Patient daughter notified forms are ready at front desk

## 2018-05-04 NOTE — Telephone Encounter (Signed)
Please advise 

## 2018-05-04 NOTE — Telephone Encounter (Signed)
Forms given to [place].

## 2018-05-10 NOTE — Telephone Encounter (Signed)
Pt's daughter called in to make provider aware that the [place] is stating that on form a Dx  (Line or # 17) is needed. She said that they are also requesting to have the original copy of the form, daughter received a copy. Please assist further.    [phone]

## 2018-05-10 NOTE — Telephone Encounter (Signed)
Please call patients daughter 

## 2018-05-10 NOTE — Telephone Encounter (Signed)
Please advise, I have placed a copy on your desk. I have notified daughter that form she has is the original. Patients daughter states that #17 needs a diagnosis.

## 2018-05-13 NOTE — Telephone Encounter (Signed)
I filled the sent on the copy though it sounds as though they need the original to be completed.  If that is the case the daughter needs to bring the original back for [place] to fill this in.

## 2018-05-13 NOTE — Telephone Encounter (Signed)
Patient notified

## 2018-07-01 DIAGNOSIS — H401133 Primary open-angle glaucoma, bilateral, severe stage: Secondary | ICD-10-CM | POA: Diagnosis not present

## 2018-07-07 ENCOUNTER — Ambulatory Visit (INDEPENDENT_AMBULATORY_CARE_PROVIDER_SITE_OTHER): Payer: PPO | Admitting: Family Medicine

## 2018-07-07 ENCOUNTER — Encounter: Payer: Self-pay | Admitting: Family Medicine

## 2018-07-07 VITALS — BP 132/64 | HR 78 | Temp 98.3°F | Ht 67.0 in | Wt 113.4 lb

## 2018-07-07 DIAGNOSIS — R21 Rash and other nonspecific skin eruption: Secondary | ICD-10-CM | POA: Diagnosis not present

## 2018-07-07 DIAGNOSIS — L299 Pruritus, unspecified: Secondary | ICD-10-CM

## 2018-07-07 DIAGNOSIS — W57XXXA Bitten or stung by nonvenomous insect and other nonvenomous arthropods, initial encounter: Secondary | ICD-10-CM | POA: Diagnosis not present

## 2018-07-07 MED ORDER — TRIAMCINOLONE ACETONIDE 0.1 % EX CREA: 1.0000 "application " | TOPICAL_CREAM | Freq: Two times a day (BID) | CUTANEOUS | 0 refills | Status: DC

## 2018-07-07 NOTE — Patient Instructions (Signed)
Great to meet you! 

## 2018-07-07 NOTE — Progress Notes (Signed)
Subjective:    Patient ID: Lisa Sosa, female    DOB: 11-15-1922, 84 y.o.   MRN: [id number]  HPI   Patient presents to clinic due to possible insect bites on right forearm and left forearm.  The wound on right forearm are more itchy.  Insect bite on left forearm seems to have resolved over the past 2 days, 2 bites on the right forearm still seems slightly red and raised.  Patient is unsure of what exactly bit her, but speculates could have been mosquito or spider.  Denies any fever or chills.  Denies any worsening redness on skin.    Patient Active Problem List   Diagnosis Date Noted  . Neck stiffness 05/03/2018  . Encounter for completion of form with patient 05/03/2018  . Hypotension due to hypovolemia 03/23/2018  . Depression, major, single episode, mild ([place]) 02/10/2017  . Weight loss 02/10/2017  . Right shoulder pain 02/10/2017  . 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 Bilateral lower extremity edema 05/13/2016  . History of pulmonary embolus (PE) 02/10/2016  . Diverticulitis of colon 02/10/2016  . Neuropathy 11/13/2015  . Prediabetes 11/13/2015  . Conjunctivitis 11/13/2015  . Acute cystitis 11/01/2012  . Calculus of kidney 11/01/2012  . 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 Malaise and fatigue 11/01/2012  . 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 Hematuria, microscopic 11/01/2012  . Excessive urination at night 11/01/2012  . 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 Microscopic hematuria 11/01/2012  . Urge incontinence of urine 11/01/2012   Social History   Tobacco Use  . Smoking status: Never Smoker  . Smokeless tobacco: Never Used  Substance Use Topics  . Alcohol use: No    Alcohol/week: 0.0 standard drinks   Review of Systems  Constitutional: Negative for chills, fatigue and fever.   HENT: Negative for congestion, ear pain, sinus pain and sore throat.   Eyes:  Negative.   Respiratory: Negative for cough, shortness of breath and wheezing.   Cardiovascular: Negative for chest pain, palpitations and leg swelling.  Gastrointestinal: Negative for abdominal pain, diarrhea, nausea and vomiting.  Genitourinary: Negative for dysuria, frequency and urgency.  Musculoskeletal: Negative for arthralgias and myalgias.  Skin: possible bug bites on right forearm/left forearm  Neurological: Negative for syncope, light-headedness and headaches.  Psychiatric/Behavioral: The patient is not nervous/anxious.       Objective:   Physical Exam  Constitutional: She is oriented to person, place, and time. No distress.  HENT:  Head: Normocephalic and atraumatic.  Eyes: No scleral icterus.  Cardiovascular: Normal rate and regular rhythm.  Pulmonary/Chest: Effort normal and breath sounds normal. No respiratory distress.  Musculoskeletal: She exhibits no edema.  Uses 4 wheeled walker.  Neurological: She is alert and oriented to person, place, and time.  Skin: Skin is warm and dry.  Small faintly red circular area on left forearm approximately size of pea.  2 small red circular areas on right forearm, each about the size of penny.  Skin is not hot to touch.  Nursing note and vitals reviewed.     Vitals:   07/07/18 1010  BP: 132/64  Pulse: 78  Temp: 98.3 F (36.8 C)  SpO2: 98%    Assessment & Plan:    Eruption of skin/itching/suspected bug bite- areas appear very mild.  Patient will apply thin layer of triamcinolone cream to affected areas twice a day for 7 days, I  expect areas to resolve completely.  Triamcinolone cream will help improve redness and also improve itching.   Keep regular follow-up as already scheduled.  Return to clinic sooner if issues arise.

## 2018-07-08 ENCOUNTER — Ambulatory Visit: Payer: PPO | Admitting: Family Medicine

## 2018-09-16 ENCOUNTER — Ambulatory Visit: Admission: RE | Admit: 2018-09-16 | Payer: PPO | Source: Ambulatory Visit

## 2018-09-16 ENCOUNTER — Encounter
Admission: RE | Admit: 2018-09-16 | Discharge: 2018-09-16 | Disposition: A | Discharge status: Home / Self Care | Payer: PPO | Source: Ambulatory Visit | Attending: Oncology | Admitting: Oncology

## 2018-09-16 ENCOUNTER — Other Ambulatory Visit: Payer: Self-pay

## 2018-09-16 DIAGNOSIS — C672 Malignant neoplasm of lateral wall of bladder: Secondary | ICD-10-CM

## 2018-09-16 DIAGNOSIS — C8330 Diffuse large B-cell lymphoma, unspecified site: Secondary | ICD-10-CM | POA: Diagnosis not present

## 2018-09-16 DIAGNOSIS — C8339 Diffuse large B-cell lymphoma, extranodal and solid organ sites: Secondary | ICD-10-CM | POA: Insufficient documentation

## 2018-09-16 LAB — GLUCOSE, CAPILLARY: Glucose-Capillary: 84 mg/dL (ref 70–99)

## 2018-09-16 MED ORDER — FLUDEOXYGLUCOSE F - 18 (FDG) INJECTION
6.4000 | Freq: Once | INTRAVENOUS | Status: AC | PRN
  Administered 2018-09-16: 6.4 via INTRAVENOUS

## 2018-09-19 ENCOUNTER — Ambulatory Visit: Payer: PPO | Admitting: Internal Medicine

## 2018-09-19 ENCOUNTER — Other Ambulatory Visit: Payer: PPO

## 2018-09-21 ENCOUNTER — Encounter: Payer: Self-pay | Admitting: Internal Medicine

## 2018-09-21 ENCOUNTER — Other Ambulatory Visit: Payer: Self-pay

## 2018-09-21 ENCOUNTER — Inpatient Hospital Stay (HOSPITAL_BASED_OUTPATIENT_CLINIC_OR_DEPARTMENT_OTHER): Payer: PPO | Admitting: Internal Medicine

## 2018-09-21 ENCOUNTER — Inpatient Hospital Stay: Payer: PPO | Attending: Internal Medicine

## 2018-09-21 VITALS — BP 154/69 | HR 69 | Temp 97.6°F | Resp 20 | Ht 67.0 in | Wt 119.0 lb

## 2018-09-21 DIAGNOSIS — C8581 Other specified types of non-Hodgkin lymphoma, lymph nodes of head, face, and neck: Secondary | ICD-10-CM

## 2018-09-21 DIAGNOSIS — Z8551 Personal history of malignant neoplasm of bladder: Secondary | ICD-10-CM | POA: Diagnosis not present

## 2018-09-21 DIAGNOSIS — Z9221 Personal history of antineoplastic chemotherapy: Secondary | ICD-10-CM | POA: Diagnosis not present

## 2018-09-21 DIAGNOSIS — D649 Anemia, unspecified: Secondary | ICD-10-CM | POA: Diagnosis not present

## 2018-09-21 DIAGNOSIS — Z86718 Personal history of other venous thrombosis and embolism: Secondary | ICD-10-CM

## 2018-09-21 DIAGNOSIS — C8331 Diffuse large B-cell lymphoma, lymph nodes of head, face, and neck: Secondary | ICD-10-CM | POA: Diagnosis not present

## 2018-09-21 DIAGNOSIS — Z86711 Personal history of pulmonary embolism: Secondary | ICD-10-CM | POA: Diagnosis not present

## 2018-09-21 DIAGNOSIS — Z79899 Other long term (current) drug therapy: Secondary | ICD-10-CM | POA: Insufficient documentation

## 2018-09-21 DIAGNOSIS — C672 Malignant neoplasm of lateral wall of bladder: Secondary | ICD-10-CM

## 2018-09-21 DIAGNOSIS — Z7901 Long term (current) use of anticoagulants: Secondary | ICD-10-CM | POA: Insufficient documentation

## 2018-09-21 LAB — COMPREHENSIVE METABOLIC PANEL
ALK PHOS: 62 U/L (ref 38–126)
ALT: 12 U/L (ref 0–44)
AST: 19 U/L (ref 15–41)
Albumin: 4 g/dL (ref 3.5–5.0)
Anion gap: 6 (ref 5–15)
BUN: 32 mg/dL — ABNORMAL HIGH (ref 8–23)
CO2: 31 mmol/L (ref 22–32)
Calcium: 9.3 mg/dL (ref 8.9–10.3)
Chloride: 106 mmol/L (ref 98–111)
Creatinine, Ser: 0.95 mg/dL (ref 0.44–1.00)
GFR calc non Af Amer: 52 mL/min — ABNORMAL LOW (ref >60)
GFR, EST AFRICAN AMERICAN: 60 mL/min — AB (ref >60)
Glucose, Bld: 106 mg/dL — ABNORMAL HIGH (ref 70–99)
POTASSIUM: 5 mmol/L (ref 3.5–5.1)
Sodium: 143 mmol/L (ref 135–145)
Total Bilirubin: 0.6 mg/dL (ref 0.3–1.2)
Total Protein: 6.5 g/dL (ref 6.5–8.1)

## 2018-09-21 LAB — CBC WITH DIFFERENTIAL/PLATELET
ABS IMMATURE GRANULOCYTES: 0.01 10*3/uL (ref 0.00–0.07)
Basophils Absolute: 0 10*3/uL (ref 0.0–0.1)
Basophils Relative: 0 %
EOS PCT: 3 %
Eosinophils Absolute: 0.1 10*3/uL (ref 0.0–0.5)
HCT: 35.6 % — ABNORMAL LOW (ref 36.0–46.0)
Hemoglobin: 11.3 g/dL — ABNORMAL LOW (ref 12.0–15.0)
Immature Granulocytes: 0 %
Lymphocytes Relative: 24 %
Lymphs Abs: 1.2 10*3/uL (ref 0.7–4.0)
MCH: 29.7 pg (ref 26.0–34.0)
MCHC: 31.7 g/dL (ref 30.0–36.0)
MCV: 93.4 fL (ref 80.0–100.0)
Monocytes Absolute: 0.5 10*3/uL (ref 0.1–1.0)
Monocytes Relative: 10 %
NEUTROS PCT: 63 %
NRBC: 0 % (ref 0.0–0.2)
Neutro Abs: 3 10*3/uL (ref 1.7–7.7)
Platelets: 176 10*3/uL (ref 150–400)
RBC: 3.81 MIL/uL — AB (ref 3.87–5.11)
RDW: 13.7 % (ref 11.5–15.5)
WBC: 4.8 10*3/uL (ref 4.0–10.5)

## 2018-09-21 LAB — IRON AND TIBC
Iron: 67 ug/dL (ref 28–170)
SATURATION RATIOS: 16 % (ref 10.4–31.8)
TIBC: 425 ug/dL (ref 250–450)
UIBC: 358 ug/dL

## 2018-09-21 LAB — FERRITIN: Ferritin: 16 ng/mL (ref 11–307)

## 2018-09-21 NOTE — Progress Notes (Signed)
[facility] OFFICE PROGRESS NOTE  Patient Care Team: [name], MD as PCP - General (Family Medicine) Christene Lye, MD as Consulting Physician (General Surgery) System, Provider Not In  No matching staging information was found for the patient.   Oncology History   32.  84yo- year-old lady presented with large left neck mass and abdominal palpable mass.  Biopsy of left neck mass is being positive for diffuse B large cell lymphoma; Clinically staged as stage III (June, 2016) 2.  Patient did not want any chemotherapy so was started on prednisone followed by Rituxan in June of 2016 with excellent response 3.  Patient has finished 4 weekly cycle of rituximab on April 16, 2015 with excellent response  4. Because of bulky disease in the past patient is getting maintenance Rituxan therapy started in September of 2016; STOPPED in June 2018.   # R LE DVT- was found to have a deep vein thrombosis on xeralto. (March, 2017); And right-sided pulmonary emboli  # AUG 2017- PET- NED.   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  female pleasant patient above history of Diffuse large B-cell lymphoma currently on surveillance is here to review the results of the PET scan.  Patient denies any lumps or bumps.  Appetite is good.  No weight loss.  She continues to walk with a rolling walker.  No falls.  No bleeding   Review of Systems  Constitutional: Negative for chills,  diaphoresis, fever, malaise/fatigue and weight loss.  HENT: Negative for nosebleeds and sore throat.   Eyes: Negative for double vision.  Respiratory: Negative for cough, hemoptysis, sputum production, shortness of breath and wheezing.   Cardiovascular: Negative for chest pain, palpitations, orthopnea and leg swelling.  Gastrointestinal: Negative for abdominal pain, blood in stool, constipation, diarrhea, heartburn, melena, nausea and vomiting.  Genitourinary: Negative for dysuria, frequency and urgency.  Musculoskeletal: Negative for back pain and joint pain.  Skin: Negative.  Negative for itching and rash.  Neurological: Negative for dizziness, tingling, focal weakness, weakness and headaches.  Endo/Heme/Allergies: Does not bruise/bleed easily.  Psychiatric/Behavioral: Negative for depression. The patient is not nervous/anxious and does not have insomnia.      PAST MEDICAL HISTORY :  Past Medical History:  Diagnosis Date  . Cancer [facility]) 2011   gallbladder  . Chickenpox   . Diffuse large B cell lymphoma ([place]) 03/17/2015  . Elevated blood pressure   . Glaucoma   . History of blood transfusion     PAST SURGICAL HISTORY :   Past Surgical History:  Procedure Laterality Date  . ABDOMINAL HYSTERECTOMY  1978   For fibroids  . BREAST BIOPSY Right 1990  . TRANSURETHRAL RESECTION OF BLADDER TUMOR  2011     FAMILY HISTORY :   Family History  Problem Relation Age of Onset  . Alcoholism Unknown   . Cancer - Lung Unknown  Parent  . Cancer - Ovarian Mother   . Prostate cancer Unknown        Other relative  . Stroke Unknown        Other relative   . High blood pressure Unknown        Other relative  . Diabetes Unknown        Other relative    SOCIAL HISTORY:   Social History   Tobacco Use  . Smoking status: Never Smoker  . Smokeless tobacco: Never Used  Substance Use Topics  . Alcohol use: No    Alcohol/week: 0.0 standard drinks  . Drug use: No    ALLERGIES:  is  allergic to iron and codeine.  MEDICATIONS:  Current Outpatient Medications  Medication Sig Dispense Refill  . brimonidine (ALPHAGAN) 0.2 % ophthalmic solution Place 1 drop into both eyes 2 (two) times daily.     Marland Kitchen latanoprost (XALATAN) 0.005 % ophthalmic solution Place 1 drop into both eyes at bedtime.     . timolol (TIMOPTIC) 0.5 % ophthalmic solution Place 1 drop into both eyes 2 (two) times daily.     Marland Kitchen triamcinolone cream (KENALOG) 0.1 % Apply 1 application topically 2 (two) times daily. For 7 days 15 g 0  . XARELTO 10 MG TABS tablet TAKE 1 TABLET(10 MG) BY MOUTH DAILY 30 tablet 3   No current facility-administered medications for this visit.     PHYSICAL EXAMINATION: ECOG PERFORMANCE STATUS: 2 - Symptomatic, <50% confined to bed  BP (!) 154/69 (BP Location: Left Arm, Patient Position: Sitting)   Pulse 69   Temp 97.6 F (36.4 C) (Tympanic)   Resp 20   Ht 5\' 7"  (1.702 m)   Wt 119 lb (54 kg)   BMI 18.64 kg/m   Filed Weights   09/21/18 1403  Weight: 119 lb (54 kg)    Physical Exam  Constitutional: She is oriented to person, place, and time and well-developed, well-nourished, and in no distress.  Frail-appearing Caucasian female patient.  She is accompanied by daughter.  She is walking with a rolling walker.  HENT:  Head: Normocephalic and atraumatic.  Mouth/Throat: Oropharynx is clear and moist. No oropharyngeal exudate.  Eyes: Pupils are equal, round, and reactive to light.  Neck: Normal range of motion. Neck supple.  Cardiovascular: Normal rate and regular rhythm.  Pulmonary/Chest: No respiratory distress. She has no wheezes.  Abdominal: Soft. Bowel sounds are normal. She exhibits no distension and no mass. There is no tenderness. There is no rebound and no guarding.  Musculoskeletal: Normal range of motion. She exhibits no edema or tenderness.  Neurological: She is alert and oriented to person, place, and time.  Skin: Skin is warm.  Psychiatric: Affect normal.      LABORATORY DATA:  I have reviewed the data as listed    Component Value Date/Time   NA 143 09/21/2018 1338   K 5.0 09/21/2018 1338   CL 106 09/21/2018 1338   CO2 31 09/21/2018 1338   GLUCOSE 106 (H) 09/21/2018 1338   BUN 32 (H) 09/21/2018 1338   CREATININE 0.95 09/21/2018 1338   CALCIUM 9.3 09/21/2018 1338   PROT 6.5 09/21/2018 1338   ALBUMIN 4.0 09/21/2018 1338   AST 19 09/21/2018 1338   ALT 12 09/21/2018 1338   ALKPHOS 62 09/21/2018 1338   BILITOT 0.6 09/21/2018 1338   GFRNONAA 52 (L) 09/21/2018 1338   GFRAA 60 (L) 09/21/2018 1338    No results found for: SPEP, UPEP  Lab Results  Component Value Date   WBC 4.8 09/21/2018   NEUTROABS 3.0 09/21/2018   HGB 11.3 (L) 09/21/2018   HCT 35.6 (L) 09/21/2018   MCV 93.4 09/21/2018   PLT 176 09/21/2018      Chemistry      Component Value Date/Time   NA 143 09/21/2018 1338   K 5.0 09/21/2018 1338   CL 106 09/21/2018 1338   CO2 31 09/21/2018 1338   BUN 32 (H) 09/21/2018 1338   CREATININE 0.95 09/21/2018 1338      Component Value Date/Time   CALCIUM 9.3 09/21/2018 1338   ALKPHOS 62 09/21/2018 1338   AST 19 09/21/2018 1338   ALT 12 09/21/2018 1338   BILITOT 0.6 09/21/2018 1338       RADIOGRAPHIC STUDIES: I have personally reviewed the radiological images as listed and agreed with the findings in the report. No results found.   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 Clinically no evidence of progression noted. last Rituxan- June 2018. STABLE.   #  PET- DEC 2019  no evidence of lymphoma noted; however mild continued right hilar uptake [likely reactive].  Continue surveillance.  I had a discussion regarding continued Scans versus clinical surveillance.  Interested in scans for now; however will call to cancel if not interested.  # Right LE DVT/ History of PE [March 2017]- on Xarelto 10 mg/day. No falls. STABLE.   # Mild  Anemia-hemoglobin on 11.  No obvious etiology noted.  Monitor for now.  Check iron studies/ferritin.  #DISPOSITION:  # follow up in 6 months-MD- /labs-cbc/cmp PET prior.  Add iron studies ferritin today.  # 25 minutes face-to-face with the patient discussing the above plan of care; more than 50% of time spent on prognosis/ natural history; counseling and coordination.  # I reviewed the blood work- with the patient in detail; also reviewed the imaging independently [as summarized above]; and with the patient in detail.    # I reviewed the blood work- with the patient in detail; also reviewed the imaging independently [as summarized above]; and with the patient in detail.    Orders Placed This Encounter  Procedures  . NM PET Image Restag (PS) Skull Base To Thigh    Standing Status:   Future    Standing Expiration Date:   09/21/2019    Order Specific Question:   ** REASON FOR EXAM (FREE TEXT)    Answer:   Diffuse Large B cell lymphoma    Order Specific Question:   If indicated for the ordered procedure, I authorize the administration of a radiopharmaceutical per Radiology protocol    Answer:   Yes    Order Specific Question:   Preferred imaging location?    Answer:   [facility]    Order Specific Question:   Radiology Contrast Protocol - do NOT remove file path    Answer:   [email or web address]   All questions were answered. The patient knows to call the clinic with any problems, questions or concerns.      Cammie Sickle, MD 09/21/2018 4:07 PM

## 2018-09-21 NOTE — Addendum Note (Signed)
Addended by: Sandria Bales B on: 09/21/2018 04:09 PM   Modules accepted: Orders

## 2018-09-21 NOTE — Assessment & Plan Note (Addendum)
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 Clinically no evidence of progression noted. last Rituxan- June 2018. STABLE.   #  PET- DEC 2019  no evidence of lymphoma noted; however mild continued right hilar uptake [likely reactive].  Continue surveillance.  I had a discussion regarding continued Scans versus clinical surveillance.  Interested in scans for now; however will call to cancel if not interested.  # Right LE DVT/ History of PE [March 2017]- on Xarelto 10 mg/day. No falls. STABLE.   # Mild Anemia-hemoglobin on 11.  No obvious etiology noted.  Monitor for now.  Check iron studies/ferritin.  #DISPOSITION:  # follow up in 6 months-MD- /labs-cbc/cmp PET prior.  Add iron studies ferritin today.  # 25 minutes face-to-face with the patient discussing the above plan of care; more than 50% of time spent on prognosis/ natural history; counseling and coordination.  # I reviewed the blood work- with the patient in detail; also reviewed the imaging independently [as summarized above]; and with the patient in detail.

## 2018-11-15 DIAGNOSIS — H401133 Primary open-angle glaucoma, bilateral, severe stage: Secondary | ICD-10-CM | POA: Diagnosis not present

## 2018-12-07 ENCOUNTER — Telehealth: Payer: Self-pay | Admitting: Family Medicine

## 2018-12-07 NOTE — Telephone Encounter (Signed)
Med tech will call back and ask for Lisa Sosa, to ask more questions pertaining to pt symptons.  Lisa Sosa, cma

## 2018-12-07 NOTE — Telephone Encounter (Signed)
Please contact them and get more details. How long has she had symptoms? Is she getting better or worse? Is she coughing anything up? Who is requesting that I write for these medications? Has she been evaluated by a physician? If so, who evaluated her? Thanks.

## 2018-12-07 NOTE — Telephone Encounter (Signed)
Med tech called back and stated pt has been sick since 12/04/2018 and she is coughing, Lisa Sosa has been quarantined and pt is requesting a z pack and probiotics, pt has not been evaluated b ya physician, as far as she knows.  Gae Bon, CMA

## 2018-12-07 NOTE — Telephone Encounter (Signed)
Noted. Based on description of her symptoms she does not meet criteria for antibiotic treatment. She likely has a viral illness. She should be evaluated by a physician to determine appropriate treatment. If there is a physician or NP at the facility it would be preferred that they evaluate her to determine the need for treatment. Thanks.

## 2018-12-07 NOTE — Telephone Encounter (Signed)
Copied from [place] [id number]. Topic: Quick Communication - See Telephone Encounter >> Dec 07, 2018  2:04 PM Margot Ables wrote: CRM for notification. See Telephone encounter for: 12/07/18. [facility] has so many cases of URI they have had to contact the health dept and are under quarantine. Ms. Glandon is having URI symptoms of cough, congestion in her chest, runny nose, no fever. Requesting that Dr. Caryl Bis write orders for pt as she is not under care of the house doctor so the house doctor could not prescribe medication for her.  Mucinex 7/day 2x day Arith 500mg  5 days Priob 2/day 7 days.

## 2018-12-13 NOTE — Telephone Encounter (Signed)
Called pt on home and mobile number and left a VM to call back. CRM created and sent to [facility] pool.

## 2018-12-14 NOTE — Telephone Encounter (Signed)
Called pt and left a VM to call back. CRM created and sent to [facility] pool. 

## 2018-12-15 NOTE — Telephone Encounter (Signed)
Lisa Sosa and spoke with nurse she stated that everyone there was under quarantine for URI and all other patients were treated except for Mrs. Lisa Sosa. Nurse stated that she seems to be doing OK and is no longer under quarantine. The lady that I spoke to stated that she will inform the other nurse if further treatment is needed a doctor or NP would need to assist patient.

## 2018-12-15 NOTE — Telephone Encounter (Signed)
As long as she is doing better, than we can monitor  Viral illness often can last for 7-10 days and are treated with supportive care to help reduce symptoms  If symptoms return or worsen she can come in to be seen or be seen by Doc or NP at the facility

## 2019-03-13 ENCOUNTER — Telehealth: Payer: Self-pay

## 2019-03-13 NOTE — Telephone Encounter (Signed)
Copied from [place] [id number]. Topic: General - Call Back - No Documentation >> Mar 13, 2019 10:23 AM Erick Blinks wrote: Reason for CRM: Pt's daughter Lisa Sosa called to return missed call from last week regarding pt. Lisa Sosa did not listen to the voicemail herself, the message was relayed through her husband that did not provide name of the staff that left voicemail. She is requesting call back. Best Contact: [phone]

## 2019-03-23 ENCOUNTER — Other Ambulatory Visit: Payer: PPO

## 2019-03-27 ENCOUNTER — Other Ambulatory Visit: Payer: PPO

## 2019-03-27 ENCOUNTER — Ambulatory Visit: Payer: PPO | Admitting: Internal Medicine

## 2019-04-05 DIAGNOSIS — Z20828 Contact with and (suspected) exposure to other viral communicable diseases: Secondary | ICD-10-CM | POA: Diagnosis not present

## 2019-04-07 DIAGNOSIS — Z20828 Contact with and (suspected) exposure to other viral communicable diseases: Secondary | ICD-10-CM | POA: Diagnosis not present

## 2019-04-26 ENCOUNTER — Telehealth: Payer: Self-pay | Admitting: *Deleted

## 2019-04-26 NOTE — Telephone Encounter (Signed)
Ok to post pone apts for one month and she will need a pet scan prior.

## 2019-04-26 NOTE — Telephone Encounter (Signed)
Lisa Sosa, Has she been tested and does she have any symptoms. Yes, Most likely, she will need to reschedule.  Dr. B please advise of how far out pt should r/s or could this be scheduled as a virtual visit?

## 2019-04-26 NOTE — Telephone Encounter (Signed)
Patient has an appointment next week and she livees in assisted living where several patients are positive for COVID 19. Asking if she should reschedule the appointment next week

## 2019-04-26 NOTE — Telephone Encounter (Signed)
She did not say if she had been tested, just that several residents around her are positive

## 2019-05-01 ENCOUNTER — Inpatient Hospital Stay: Payer: PPO | Admitting: Internal Medicine

## 2019-05-01 ENCOUNTER — Inpatient Hospital Stay: Payer: PPO

## 2019-05-22 ENCOUNTER — Ambulatory Visit: Payer: PPO

## 2019-05-25 DIAGNOSIS — H401123 Primary open-angle glaucoma, left eye, severe stage: Secondary | ICD-10-CM | POA: Diagnosis not present

## 2019-06-01 ENCOUNTER — Other Ambulatory Visit: Payer: PPO

## 2019-06-01 ENCOUNTER — Ambulatory Visit: Payer: PPO | Admitting: Internal Medicine

## 2019-06-01 DIAGNOSIS — H401133 Primary open-angle glaucoma, bilateral, severe stage: Secondary | ICD-10-CM | POA: Diagnosis not present

## 2019-06-05 ENCOUNTER — Other Ambulatory Visit: Payer: Self-pay

## 2019-06-05 DIAGNOSIS — C672 Malignant neoplasm of lateral wall of bladder: Secondary | ICD-10-CM

## 2019-06-06 ENCOUNTER — Encounter: Payer: Self-pay | Admitting: Internal Medicine

## 2019-06-06 ENCOUNTER — Other Ambulatory Visit: Payer: Self-pay

## 2019-06-06 ENCOUNTER — Inpatient Hospital Stay: Payer: PPO | Attending: Internal Medicine

## 2019-06-06 ENCOUNTER — Inpatient Hospital Stay (HOSPITAL_BASED_OUTPATIENT_CLINIC_OR_DEPARTMENT_OTHER): Payer: PPO | Admitting: Internal Medicine

## 2019-06-06 DIAGNOSIS — C672 Malignant neoplasm of lateral wall of bladder: Secondary | ICD-10-CM

## 2019-06-06 DIAGNOSIS — Z86718 Personal history of other venous thrombosis and embolism: Secondary | ICD-10-CM | POA: Insufficient documentation

## 2019-06-06 DIAGNOSIS — D649 Anemia, unspecified: Secondary | ICD-10-CM | POA: Insufficient documentation

## 2019-06-06 DIAGNOSIS — C8581 Other specified types of non-Hodgkin lymphoma, lymph nodes of head, face, and neck: Secondary | ICD-10-CM | POA: Diagnosis not present

## 2019-06-06 DIAGNOSIS — Z8572 Personal history of non-Hodgkin lymphomas: Secondary | ICD-10-CM | POA: Insufficient documentation

## 2019-06-06 DIAGNOSIS — Z85528 Personal history of other malignant neoplasm of kidney: Secondary | ICD-10-CM | POA: Diagnosis not present

## 2019-06-06 LAB — CBC WITH DIFFERENTIAL/PLATELET
Abs Immature Granulocytes: 0.01 10*3/uL (ref 0.00–0.07)
Basophils Absolute: 0 10*3/uL (ref 0.0–0.1)
Basophils Relative: 1 %
Eosinophils Absolute: 0.1 10*3/uL (ref 0.0–0.5)
Eosinophils Relative: 3 %
HCT: 33.3 % — ABNORMAL LOW (ref 36.0–46.0)
Hemoglobin: 10.6 g/dL — ABNORMAL LOW (ref 12.0–15.0)
Immature Granulocytes: 0 %
Lymphocytes Relative: 27 %
Lymphs Abs: 1.2 10*3/uL (ref 0.7–4.0)
MCH: 29.1 pg (ref 26.0–34.0)
MCHC: 31.8 g/dL (ref 30.0–36.0)
MCV: 91.5 fL (ref 80.0–100.0)
Monocytes Absolute: 0.5 10*3/uL (ref 0.1–1.0)
Monocytes Relative: 11 %
Neutro Abs: 2.6 10*3/uL (ref 1.7–7.7)
Neutrophils Relative %: 58 %
Platelets: 183 10*3/uL (ref 150–400)
RBC: 3.64 MIL/uL — ABNORMAL LOW (ref 3.87–5.11)
RDW: 14.3 % (ref 11.5–15.5)
WBC: 4.4 10*3/uL (ref 4.0–10.5)
nRBC: 0 % (ref 0.0–0.2)

## 2019-06-06 LAB — COMPREHENSIVE METABOLIC PANEL
ALT: 12 U/L (ref 0–44)
AST: 15 U/L (ref 15–41)
Albumin: 3.9 g/dL (ref 3.5–5.0)
Alkaline Phosphatase: 62 U/L (ref 38–126)
Anion gap: 7 (ref 5–15)
BUN: 27 mg/dL — ABNORMAL HIGH (ref 8–23)
CO2: 26 mmol/L (ref 22–32)
Calcium: 9.1 mg/dL (ref 8.9–10.3)
Chloride: 106 mmol/L (ref 98–111)
Creatinine, Ser: 1.09 mg/dL — ABNORMAL HIGH (ref 0.44–1.00)
GFR calc Af Amer: 50 mL/min — ABNORMAL LOW (ref >60)
GFR calc non Af Amer: 43 mL/min — ABNORMAL LOW (ref >60)
Glucose, Bld: 97 mg/dL (ref 70–99)
Potassium: 4.9 mmol/L (ref 3.5–5.1)
Sodium: 139 mmol/L (ref 135–145)
Total Bilirubin: 0.6 mg/dL (ref 0.3–1.2)
Total Protein: 6.5 g/dL (ref 6.5–8.1)

## 2019-06-06 LAB — IRON AND TIBC
Iron: 77 ug/dL (ref 28–170)
Saturation Ratios: 19 % (ref 10.4–31.8)
TIBC: 411 ug/dL (ref 250–450)
UIBC: 334 ug/dL

## 2019-06-06 LAB — FERRITIN: Ferritin: 11 ng/mL (ref 11–307)

## 2019-06-06 NOTE — Progress Notes (Signed)
[facility] OFFICE PROGRESS NOTE  Patient Care Team: [name], MD as PCP - General (Family Medicine) Christene Lye, MD as Consulting Physician (General Surgery) System, Provider Not In  No matching staging information was found for the patient.   Oncology History Overview Note  51.  85yo- year-old lady presented with large left neck mass and abdominal palpable mass.  Biopsy of left neck mass is being positive for diffuse B large cell lymphoma; Clinically staged as stage III (June, 2016) 2.  Patient did not want any chemotherapy so was started on prednisone followed by Rituxan in June of 2016 with excellent response 3.  Patient has finished 4 weekly cycle of rituximab on April 16, 2015 with excellent response  4. Because of bulky disease in the past patient is getting maintenance Rituxan therapy started in September of 2016; STOPPED in June 2018.   # R LE DVT- was found to have a deep vein thrombosis on xeralto. (March, 2017); And right-sided pulmonary emboli  # AUG 2017- PET- NED.   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  female pleasant patient above history of Diffuse large B-cell lymphoma/DVT PE on Xarelto currently on surveillance is here for follow-up.  Patient is currently in [facility] home.  She denies any blood in stools or black or stools.  No weight loss but no abdominal pain.  No constipation or diarrhea.  She continues use a rolling walker to  walk.  No falls.  No bleeding.  Review of Systems  Constitutional: Negative for chills, diaphoresis, fever, malaise/fatigue and weight loss.  HENT: Negative for nosebleeds and sore throat.   Eyes: Negative for double vision.  Respiratory: Negative for cough, hemoptysis, sputum production, shortness of breath and wheezing.   Cardiovascular: Negative for chest pain, palpitations, orthopnea and leg swelling.  Gastrointestinal: Negative for abdominal pain, blood in stool, constipation, diarrhea, heartburn, melena, nausea and vomiting.  Genitourinary: Negative for dysuria, frequency and urgency.  Musculoskeletal: Negative for back pain and joint pain.  Skin: Negative.  Negative for itching and rash.  Neurological: Negative for dizziness, tingling, focal weakness, weakness and headaches.  Endo/Heme/Allergies: Does not bruise/bleed easily.  Psychiatric/Behavioral: Negative for depression. The patient is not nervous/anxious and does not have insomnia.      PAST MEDICAL HISTORY :  Past Medical History:  Diagnosis Date  . Cancer [facility]) 2011   gallbladder  . Chickenpox   . Diffuse large B cell lymphoma ([place]) 03/17/2015  . Elevated blood pressure   . Glaucoma   . History of blood transfusion     PAST SURGICAL HISTORY :   Past Surgical History:  Procedure Laterality Date  . ABDOMINAL HYSTERECTOMY  1978   For fibroids  . BREAST BIOPSY Right 1990  . TRANSURETHRAL RESECTION OF BLADDER TUMOR  2011     FAMILY HISTORY :   Family History  Problem Relation Age of Onset  . Alcoholism Unknown   . Cancer - Lung Unknown  Parent  . Cancer - Ovarian Mother   . Prostate cancer Unknown        Other relative  . Stroke Unknown        Other relative   . High blood pressure Unknown        Other relative  . Diabetes Unknown        Other relative    SOCIAL HISTORY:   Social History   Tobacco Use  . Smoking status: Never Smoker  . Smokeless tobacco: Never Used  Substance Use Topics  .  Alcohol use: No    Alcohol/week: 0.0 standard drinks  . Drug use: No    ALLERGIES:  is allergic to iron and codeine.  MEDICATIONS:  Current Outpatient Medications  Medication Sig Dispense Refill  . brimonidine (ALPHAGAN) 0.2 % ophthalmic solution Place 1 drop into both eyes 2 (two) times daily.     Marland Kitchen latanoprost (XALATAN) 0.005 % ophthalmic solution Place 1 drop into both eyes at bedtime.     . timolol (TIMOPTIC) 0.5 % ophthalmic solution Place 1 drop into both eyes 2 (two) times daily.     Alveda Reasons 10 MG TABS tablet TAKE 1 TABLET(10 MG) BY MOUTH DAILY 30 tablet 3   No current facility-administered medications for this visit.     PHYSICAL EXAMINATION: ECOG PERFORMANCE STATUS: 2 - Symptomatic, <50% confined to bed  BP 118/63   Pulse 67   Temp (!) 96.2 F (35.7 C) (Tympanic)   Resp 20   There were no vitals filed for this visit.  Physical Exam  Constitutional: She is oriented to person, place, and time and well-developed, well-nourished, and in no distress.  Frail-appearing Caucasian female patient.  She is accompanied by daughter.  She is walking with a rolling walker.  HENT:  Head: Normocephalic and atraumatic.  Mouth/Throat: Oropharynx is clear and moist. No oropharyngeal exudate.  Eyes: Pupils are equal, round, and reactive to light.  Neck: Normal range of motion. Neck supple.  Cardiovascular: Normal rate and regular rhythm.  Pulmonary/Chest: No respiratory distress. She has no wheezes.  Abdominal: Soft. Bowel sounds are normal. She exhibits no distension and no mass. There is no abdominal tenderness. There is no rebound and no guarding.  Musculoskeletal: Normal range of motion.        General: No tenderness or edema.  Neurological: She is alert and oriented to person, place, and time.  Skin: Skin is warm.  Psychiatric: Affect normal.     LABORATORY DATA:  I have reviewed the data as listed    Component Value Date/Time   NA 139 06/06/2019 1300   K 4.9  06/06/2019 1300   CL 106 06/06/2019 1300   CO2 26 06/06/2019 1300   GLUCOSE 97 06/06/2019 1300   BUN 27 (H) 06/06/2019 1300   CREATININE 1.09 (H) 06/06/2019 1300   CALCIUM 9.1 06/06/2019 1300   PROT 6.5 06/06/2019 1300   ALBUMIN 3.9 06/06/2019 1300   AST 15 06/06/2019 1300   ALT 12 06/06/2019 1300   ALKPHOS 62 06/06/2019 1300   BILITOT 0.6 06/06/2019 1300   GFRNONAA 43 (L) 06/06/2019 1300   GFRAA 50 (L) 06/06/2019 1300    No results found for: SPEP, UPEP  Lab Results  Component Value Date   WBC 4.4 06/06/2019   NEUTROABS 2.6 06/06/2019   HGB 10.6 (L) 06/06/2019   HCT 33.3 (L) 06/06/2019   MCV 91.5 06/06/2019   PLT 183 06/06/2019      Chemistry  Component Value Date/Time   NA 139 06/06/2019 1300   K 4.9 06/06/2019 1300   CL 106 06/06/2019 1300   CO2 26 06/06/2019 1300   BUN 27 (H) 06/06/2019 1300   CREATININE 1.09 (H) 06/06/2019 1300      Component Value Date/Time   CALCIUM 9.1 06/06/2019 1300   ALKPHOS 62 06/06/2019 1300   AST 15 06/06/2019 1300   ALT 12 06/06/2019 1300   BILITOT 0.6 06/06/2019 1300       RADIOGRAPHIC STUDIES: I have personally reviewed the radiological images as listed and agreed with the findings in the report. No results found.   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 Clinically no evidence of progression noted. last Rituxan- June 2018.  Stable.  #  PET- DEC 2019  no evidence of lymphoma noted; however mild continued right hilar uptake [likely reactive].  Continue clinical surveillance.  Discussed with the family in agreement.  # Right LE DVT/ History of PE [March 2017]- on Xarelto 10 mg/day. No falls. STABLE.    # Mild Anemia-hemoglobin on 10.7; slightly lower than at baseline around 11.  Discussed if low-question further diagnostic work-up CT scan colonoscopies given her age.  Patient has poor tolerance to p.o. iron.  Question tandem if low  iron.  Check iron studies/ferritin. wil call Delcie [phone]   #DISPOSITION: add iron studies/ferritin to labs today # follow up in 6 months-MD- /labs-cbc/cmp- Dr.B   # I reviewed the blood work- with the patient in detail; also reviewed the imaging independently [as summarized above]; and with the patient in detail.    Orders Placed This Encounter  Procedures  . Iron and TIBC    Standing Status:   Future    Standing Expiration Date:   06/05/2020  . Ferritin    Standing Status:   Future    Standing Expiration Date:   06/05/2020   All questions were answered. The patient knows to call the clinic with any problems, questions or concerns.      Cammie Sickle, MD 06/06/2019 1:59 PM

## 2019-06-06 NOTE — Assessment & Plan Note (Addendum)
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 Clinically no evidence of progression noted. last Rituxan- June 2018.  Stable.  #  PET- DEC 2019  no evidence of lymphoma noted; however mild continued right hilar uptake [likely reactive].  Continue clinical surveillance.  Discussed with the family in agreement.  # Right LE DVT/ History of PE [March 2017]- on Xarelto 10 mg/day. No falls. STABLE.    # Mild Anemia-hemoglobin on 10.7; slightly lower than at baseline around 11.  Discussed if low-question further diagnostic work-up CT scan colonoscopies given her age.  Patient has poor tolerance to p.o. iron.  Question tandem if low iron.  Check iron studies/ferritin. wil call Delcie [phone]   #DISPOSITION: add iron studies/ferritin to labs today # follow up in 6 months-MD- /labs-cbc/cmp- Dr.B

## 2019-07-11 ENCOUNTER — Telehealth: Payer: Self-pay | Admitting: *Deleted

## 2019-07-11 ENCOUNTER — Other Ambulatory Visit: Payer: Self-pay | Admitting: *Deleted

## 2019-07-11 MED ORDER — TANDEM PLUS 162-115.2-1 MG PO CAPS: 1.0000 | ORAL_CAPSULE | Freq: Every day | ORAL | 4 refills | Status: DC

## 2019-07-11 NOTE — Telephone Encounter (Signed)
Patient daughter called and told them we want patient to take an iron supplement. Facility needs an order faxed to them for this  Fax # [phone]

## 2019-07-11 NOTE — Telephone Encounter (Signed)
Signed a script; will fax to Nursing facility.   Hassan Rowan- please inform pt's daughter.

## 2019-07-11 NOTE — Telephone Encounter (Signed)
Script faxed to nursing home

## 2019-07-27 DIAGNOSIS — Z0279 Encounter for issue of other medical certificate: Secondary | ICD-10-CM

## 2019-08-07 DIAGNOSIS — Z20828 Contact with and (suspected) exposure to other viral communicable diseases: Secondary | ICD-10-CM | POA: Diagnosis not present

## 2019-08-24 DIAGNOSIS — Z20828 Contact with and (suspected) exposure to other viral communicable diseases: Secondary | ICD-10-CM | POA: Diagnosis not present

## 2019-08-25 DIAGNOSIS — Z20828 Contact with and (suspected) exposure to other viral communicable diseases: Secondary | ICD-10-CM | POA: Diagnosis not present

## 2019-09-15 DIAGNOSIS — Z20828 Contact with and (suspected) exposure to other viral communicable diseases: Secondary | ICD-10-CM | POA: Diagnosis not present

## 2019-09-28 ENCOUNTER — Telehealth: Payer: Self-pay | Admitting: Family Medicine

## 2019-09-28 NOTE — Telephone Encounter (Signed)
I spoke with pt daughter Delcie Roch regarding charge.for 29.00. I explained to daughter it's a charge for [facility] forms that needed to be filled out. Daughter understood.

## 2019-10-23 DIAGNOSIS — Z23 Encounter for immunization: Secondary | ICD-10-CM | POA: Diagnosis not present

## 2019-11-16 ENCOUNTER — Telehealth: Payer: Self-pay | Admitting: Family Medicine

## 2019-11-16 DIAGNOSIS — Z20828 Contact with and (suspected) exposure to other viral communicable diseases: Secondary | ICD-10-CM | POA: Diagnosis not present

## 2019-11-16 NOTE — Telephone Encounter (Signed)
Lisa Sosa from [facility] said they faxed over an order for pt a few days ago to be able to have triple antibiotic ointment on 1x a day for toenail that came off. Do you have this order and if so can I give a verbal if they take it?  Lisa Sosa,cma

## 2019-11-16 NOTE — Telephone Encounter (Signed)
Lisa Sosa from [facility] said they faxed over an order for pt a few days ago to be able to have triple antibiotic ointment on 1x a day for toenail that came off. They said if you would send that back so they can give it to patient or call [facility].  [phone]

## 2019-11-17 NOTE — Telephone Encounter (Signed)
This should be in the sign folder.

## 2019-11-27 NOTE — Telephone Encounter (Signed)
Faxed.  Madelynn Malson,cma  

## 2019-11-28 DIAGNOSIS — H401133 Primary open-angle glaucoma, bilateral, severe stage: Secondary | ICD-10-CM | POA: Diagnosis not present

## 2019-12-04 ENCOUNTER — Other Ambulatory Visit: Payer: Self-pay | Admitting: *Deleted

## 2019-12-04 DIAGNOSIS — C8581 Other specified types of non-Hodgkin lymphoma, lymph nodes of head, face, and neck: Secondary | ICD-10-CM

## 2019-12-05 ENCOUNTER — Other Ambulatory Visit: Payer: Self-pay

## 2019-12-05 ENCOUNTER — Inpatient Hospital Stay: Payer: PPO | Attending: Internal Medicine

## 2019-12-05 ENCOUNTER — Inpatient Hospital Stay (HOSPITAL_BASED_OUTPATIENT_CLINIC_OR_DEPARTMENT_OTHER): Payer: PPO | Admitting: Internal Medicine

## 2019-12-05 ENCOUNTER — Encounter: Payer: Self-pay | Admitting: Internal Medicine

## 2019-12-05 DIAGNOSIS — Z8551 Personal history of malignant neoplasm of bladder: Secondary | ICD-10-CM | POA: Insufficient documentation

## 2019-12-05 DIAGNOSIS — Z7901 Long term (current) use of anticoagulants: Secondary | ICD-10-CM | POA: Diagnosis not present

## 2019-12-05 DIAGNOSIS — C8581 Other specified types of non-Hodgkin lymphoma, lymph nodes of head, face, and neck: Secondary | ICD-10-CM

## 2019-12-05 DIAGNOSIS — I129 Hypertensive chronic kidney disease with stage 1 through stage 4 chronic kidney disease, or unspecified chronic kidney disease: Secondary | ICD-10-CM | POA: Diagnosis not present

## 2019-12-05 DIAGNOSIS — N183 Chronic kidney disease, stage 3 unspecified: Secondary | ICD-10-CM | POA: Insufficient documentation

## 2019-12-05 DIAGNOSIS — Z8572 Personal history of non-Hodgkin lymphomas: Secondary | ICD-10-CM | POA: Insufficient documentation

## 2019-12-05 DIAGNOSIS — Z8041 Family history of malignant neoplasm of ovary: Secondary | ICD-10-CM | POA: Insufficient documentation

## 2019-12-05 DIAGNOSIS — Z8042 Family history of malignant neoplasm of prostate: Secondary | ICD-10-CM | POA: Insufficient documentation

## 2019-12-05 DIAGNOSIS — Z86711 Personal history of pulmonary embolism: Secondary | ICD-10-CM | POA: Insufficient documentation

## 2019-12-05 DIAGNOSIS — D649 Anemia, unspecified: Secondary | ICD-10-CM | POA: Insufficient documentation

## 2019-12-05 DIAGNOSIS — Z86718 Personal history of other venous thrombosis and embolism: Secondary | ICD-10-CM | POA: Insufficient documentation

## 2019-12-05 DIAGNOSIS — Z801 Family history of malignant neoplasm of trachea, bronchus and lung: Secondary | ICD-10-CM | POA: Insufficient documentation

## 2019-12-05 LAB — COMPREHENSIVE METABOLIC PANEL
ALT: 17 U/L (ref 0–44)
AST: 20 U/L (ref 15–41)
Albumin: 3.8 g/dL (ref 3.5–5.0)
Alkaline Phosphatase: 63 U/L (ref 38–126)
Anion gap: 8 (ref 5–15)
BUN: 29 mg/dL — ABNORMAL HIGH (ref 8–23)
CO2: 27 mmol/L (ref 22–32)
Calcium: 9 mg/dL (ref 8.9–10.3)
Chloride: 105 mmol/L (ref 98–111)
Creatinine, Ser: 1.32 mg/dL — ABNORMAL HIGH (ref 0.44–1.00)
GFR calc Af Amer: 40 mL/min — ABNORMAL LOW (ref >60)
GFR calc non Af Amer: 34 mL/min — ABNORMAL LOW (ref >60)
Glucose, Bld: 107 mg/dL — ABNORMAL HIGH (ref 70–99)
Potassium: 4.3 mmol/L (ref 3.5–5.1)
Sodium: 140 mmol/L (ref 135–145)
Total Bilirubin: 0.4 mg/dL (ref 0.3–1.2)
Total Protein: 6.7 g/dL (ref 6.5–8.1)

## 2019-12-05 LAB — CBC WITH DIFFERENTIAL/PLATELET
Abs Immature Granulocytes: 0.02 10*3/uL (ref 0.00–0.07)
Basophils Absolute: 0 10*3/uL (ref 0.0–0.1)
Basophils Relative: 1 %
Eosinophils Absolute: 0.2 10*3/uL (ref 0.0–0.5)
Eosinophils Relative: 4 %
HCT: 36.2 % (ref 36.0–46.0)
Hemoglobin: 11.4 g/dL — ABNORMAL LOW (ref 12.0–15.0)
Immature Granulocytes: 0 %
Lymphocytes Relative: 23 %
Lymphs Abs: 1.1 10*3/uL (ref 0.7–4.0)
MCH: 30.2 pg (ref 26.0–34.0)
MCHC: 31.5 g/dL (ref 30.0–36.0)
MCV: 95.8 fL (ref 80.0–100.0)
Monocytes Absolute: 0.5 10*3/uL (ref 0.1–1.0)
Monocytes Relative: 10 %
Neutro Abs: 3.1 10*3/uL (ref 1.7–7.7)
Neutrophils Relative %: 62 %
Platelets: 185 10*3/uL (ref 150–400)
RBC: 3.78 MIL/uL — ABNORMAL LOW (ref 3.87–5.11)
RDW: 14.1 % (ref 11.5–15.5)
WBC: 4.9 10*3/uL (ref 4.0–10.5)
nRBC: 0 % (ref 0.0–0.2)

## 2019-12-05 NOTE — Progress Notes (Signed)
[facility] OFFICE PROGRESS NOTE  Patient Care Team: [name], MD as PCP - General (Family Medicine) Christene Lye, MD as Consulting Physician (General Surgery) System, Provider Not In  No matching staging information was found for the patient.   Oncology History Overview Note  30.  86yo- year-old lady presented with large left neck mass and abdominal palpable mass.  Biopsy of left neck mass is being positive for diffuse B large cell lymphoma; Clinically staged as stage III (June, 2016) 2.  Patient did not want any chemotherapy so was started on prednisone followed by Rituxan in June of 2016 with excellent response 3.  Patient has finished 4 weekly cycle of rituximab on April 16, 2015 with excellent response  4. Because of bulky disease in the past patient is getting maintenance Rituxan therapy started in September of 2016; STOPPED in June 2018.   # R LE DVT- was found to have a deep vein thrombosis on xeralto. (March, 2017); And right-sided pulmonary emboli  # AUG 2017- PET- NED.   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  female pleasant patient above history of Diffuse large B-cell lymphoma/DVT PE on Xarelto currently on surveillance is here for follow-up.  Patient continues to live improved with nursing home.  She denies any abdominal pain.  Denies any nausea vomiting blood in stools or black or stools.  Denies any new lumps or bumps.  No falls.  No night sweats.   No weight loss.  Review of Systems  Constitutional: Negative for chills, diaphoresis, fever, malaise/fatigue and weight loss.  HENT: Negative for nosebleeds and sore throat.   Eyes: Negative for double vision.  Respiratory: Negative for cough, hemoptysis, sputum production, shortness of breath and wheezing.   Cardiovascular: Negative for chest pain, palpitations, orthopnea and leg swelling.  Gastrointestinal: Negative for abdominal pain, blood in stool, constipation, diarrhea, heartburn, melena, nausea and vomiting.  Genitourinary: Negative for dysuria, frequency and urgency.  Musculoskeletal: Negative for back pain and joint pain.  Skin: Negative.  Negative for itching and rash.  Neurological: Negative for dizziness, tingling, focal weakness, weakness and headaches.  Endo/Heme/Allergies: Does not bruise/bleed easily.  Psychiatric/Behavioral: Negative for depression. The patient is not nervous/anxious and does not have insomnia.      PAST MEDICAL HISTORY :  Past Medical History:  Diagnosis Date  . Cancer [facility]) 2011   gallbladder  . Chickenpox   . Diffuse large B cell lymphoma ([place]) 03/17/2015  . Elevated blood pressure   . Glaucoma   . History of blood transfusion     PAST SURGICAL HISTORY :   Past Surgical History:  Procedure Laterality Date  . ABDOMINAL HYSTERECTOMY  1978   For fibroids  . BREAST BIOPSY Right 1990  . TRANSURETHRAL RESECTION OF BLADDER TUMOR  2011     FAMILY HISTORY :   Family History  Problem Relation Age of Onset  . Alcoholism Unknown   . Cancer - Lung Unknown  Parent  . Cancer - Ovarian Mother   . Prostate cancer Unknown        Other relative  . Stroke Unknown        Other relative   . High blood pressure Unknown        Other relative  . Diabetes Unknown        Other relative    SOCIAL HISTORY:   Social History   Tobacco Use  . Smoking status: Never Smoker  . Smokeless tobacco: Never Used  Substance Use Topics  . Alcohol use: No     Alcohol/week: 0.0 standard drinks  . Drug use: No    ALLERGIES:  is allergic to iron and codeine.  MEDICATIONS:  Current Outpatient Medications  Medication Sig Dispense Refill  . brimonidine (ALPHAGAN) 0.2 % ophthalmic solution Place 1 drop into both eyes 2 (two) times daily.     Marland Kitchen FeFum-FePo-FA-B Cmp-C-Zn-Mn-Cu (TANDEM PLUS) 162-115.2-1 MG CAPS Take 1 capsule by mouth daily. 30 capsule 4  . latanoprost (XALATAN) 0.005 % ophthalmic solution Place 1 drop into both eyes at bedtime.     . timolol (TIMOPTIC) 0.5 % ophthalmic solution Place 1 drop into both eyes 2 (two) times daily.     Alveda Reasons 10 MG TABS tablet TAKE 1 TABLET(10 MG) BY MOUTH DAILY 30 tablet 3   No current facility-administered medications for this visit.    PHYSICAL EXAMINATION: ECOG PERFORMANCE STATUS: 2 - Symptomatic, <50% confined to bed  BP (!) 126/46 (BP Location: Left Arm, Patient Position: Sitting, Cuff Size: Normal)   Pulse 70   Temp 98.1 F (36.7 C) (Tympanic)   Wt 118 lb 6 oz (53.7 kg)   BMI 18.54 kg/m   Filed Weights   12/05/19 1402  Weight: 118 lb 6 oz (53.7 kg)    Physical Exam  Constitutional: She is oriented to person, place, and time and well-developed, well-nourished, and in no distress.  Frail-appearing Caucasian female patient.  She is accompanied by daughter.  She is walking with a rolling walker.  HENT:  Head: Normocephalic and atraumatic.  Mouth/Throat: Oropharynx is clear and moist. No oropharyngeal exudate.  Eyes: Pupils are equal, round, and reactive to light.  Cardiovascular: Normal rate and regular rhythm.  Pulmonary/Chest: No respiratory distress. She has no wheezes.  Abdominal: Soft. Bowel sounds are normal. She exhibits no distension and no mass. There is no abdominal tenderness. There is no rebound and no guarding.  Musculoskeletal:        General: No tenderness or edema. Normal range of motion.     Cervical back: Normal range of motion and neck supple.  Neurological: She  is alert and oriented to person, place, and time.  Skin: Skin is warm.  Psychiatric: Affect normal.     LABORATORY DATA:  I have reviewed the data as listed    Component Value Date/Time   NA 140 12/05/2019 1344   K 4.3 12/05/2019 1344   CL 105 12/05/2019 1344   CO2 27 12/05/2019 1344   GLUCOSE 107 (H) 12/05/2019 1344   BUN 29 (H) 12/05/2019 1344   CREATININE 1.32 (H) 12/05/2019 1344   CALCIUM 9.0 12/05/2019 1344   PROT 6.7 12/05/2019 1344   ALBUMIN 3.8 12/05/2019 1344   AST 20 12/05/2019 1344   ALT 17 12/05/2019 1344   ALKPHOS 63 12/05/2019 1344   BILITOT 0.4 12/05/2019 1344   GFRNONAA 34 (L) 12/05/2019 1344   GFRAA 40 (L) 12/05/2019 1344    No results found for:  SPEP, UPEP  Lab Results  Component Value Date   WBC 4.9 12/05/2019   NEUTROABS 3.1 12/05/2019   HGB 11.4 (L) 12/05/2019   HCT 36.2 12/05/2019   MCV 95.8 12/05/2019   PLT 185 12/05/2019      Chemistry      Component Value Date/Time   NA 140 12/05/2019 1344   K 4.3 12/05/2019 1344   CL 105 12/05/2019 1344   CO2 27 12/05/2019 1344   BUN 29 (H) 12/05/2019 1344   CREATININE 1.32 (H) 12/05/2019 1344      Component Value Date/Time   CALCIUM 9.0 12/05/2019 1344   ALKPHOS 63 12/05/2019 1344   AST 20 12/05/2019 1344   ALT 17 12/05/2019 1344   BILITOT 0.4 12/05/2019 1344       RADIOGRAPHIC STUDIES: I have personally reviewed the radiological images as listed and agreed with the findings in the report. No results found.   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 last Rituxan- June 2018.  Stable  #  PET- DEC 2019  no evidence of lymphoma noted; however mild continued right hilar uptake [likely reactive].  Continue clinical surveillance.  Discussed with the family in agreement.  # Right LE DVT/ History of PE [March 2017]- on Xarelto 10 mg/day. No falls.  Continue.  # Mild  Anemia-slightly lower than at baseline around 11; overall stable.  Continue p.o. iron.  # CKD- stage III-recommend continued hydration.  #DISPOSITION:  # follow up in 6 months-MD- /labs-cbc/cmp- Dr.B   # I reviewed the blood work- with the patient in detail; also reviewed the imaging independently [as summarized above]; and with the patient in detail.    Orders Placed This Encounter  Procedures  . CBC with Differential    Standing Status:   Future    Standing Expiration Date:   12/04/2020  . Comprehensive metabolic panel    Standing Status:   Future    Standing Expiration Date:   12/04/2020   All questions were answered. The patient knows to call the clinic with any problems, questions or concerns.      Cammie Sickle, MD 12/06/2019 9:01 AM

## 2019-12-05 NOTE — Assessment & Plan Note (Addendum)
Diffuse large B-cell lymphoma s/p Rituxan-  Clinically no evidence of progression noted. last Rituxan- June 2018.  Stable  #  PET- DEC 2019  no evidence of lymphoma noted; however mild continued right hilar uptake [likely reactive].  Continue clinical surveillance.  Discussed with the family in agreement.  # Right LE DVT/ History of PE [March 2017]- on Xarelto 10 mg/day. No falls.  Continue.  # Mild Anemia-slightly lower than at baseline around 11; overall stable.  Continue p.o. iron.  # CKD- stage III-recommend continued hydration.  #DISPOSITION:  # follow up in 6 months-MD- /labs-cbc/cmp- Dr.B

## 2020-01-05 ENCOUNTER — Telehealth: Payer: Self-pay

## 2020-01-05 NOTE — Telephone Encounter (Signed)
A order summary report was signed and faxed to Nanine Means Cassie Freer) on 01/04/20 @ 3:38 pm. Confirmation given.  Zyler Hyson,cma

## 2020-04-25 ENCOUNTER — Emergency Department
Admission: EM | Admit: 2020-04-25 | Discharge: 2020-04-25 | Disposition: A | Discharge status: Home / Self Care | Payer: PPO | Attending: Student in an Organized Health Care Education/Training Program | Admitting: Student in an Organized Health Care Education/Training Program

## 2020-04-25 ENCOUNTER — Encounter: Payer: Self-pay | Admitting: Emergency Medicine

## 2020-04-25 ENCOUNTER — Other Ambulatory Visit: Payer: Self-pay

## 2020-04-25 DIAGNOSIS — Z8551 Personal history of malignant neoplasm of bladder: Secondary | ICD-10-CM | POA: Diagnosis not present

## 2020-04-25 DIAGNOSIS — K0889 Other specified disorders of teeth and supporting structures: Secondary | ICD-10-CM | POA: Diagnosis not present

## 2020-04-25 MED ORDER — LIDOCAINE VISCOUS HCL 2 % MT SOLN
15.0000 mL | Freq: Once | OROMUCOSAL | Status: AC
  Administered 2020-04-25: 15 mL via OROMUCOSAL
  Filled 2020-04-25: qty 15

## 2020-04-25 MED ORDER — TRAMADOL HCL 50 MG PO TABS
25.0000 mg | ORAL_TABLET | Freq: Once | ORAL | Status: AC
  Administered 2020-04-25: 25 mg via ORAL
  Filled 2020-04-25: qty 1

## 2020-04-25 MED ORDER — AMOXICILLIN 500 MG PO CAPS
500.0000 mg | ORAL_CAPSULE | Freq: Once | ORAL | Status: AC
  Administered 2020-04-25: 500 mg via ORAL
  Filled 2020-04-25: qty 1

## 2020-04-25 MED ORDER — AMOXICILLIN 500 MG PO CAPS: 500.0000 mg | ORAL_CAPSULE | Freq: Three times a day (TID) | ORAL | 0 refills | Status: DC

## 2020-04-25 MED ORDER — TRAMADOL HCL 50 MG PO TABS: 25.0000 mg | ORAL_TABLET | Freq: Four times a day (QID) | ORAL | 0 refills | Status: DC | PRN

## 2020-04-25 MED ORDER — LIDOCAINE VISCOUS HCL 2 % MT SOLN: OROMUCOSAL | 0 refills | Status: DC

## 2020-04-25 NOTE — Discharge Instructions (Addendum)
Call make an appointment with your dentist as soon as possible. Begin taking the amoxicillin 500 mg 3 times daily until completely gone. You may also put the lidocaine on a cottonball and place it on the area that hurts the worst. Leave this on for approximately 5 to 10 minutes. You will still need to take Tylenol sparingly. The lidocaine gel is to take in between to see if this helps control your dental pain.  Tramadol is a pain medication and also sent to your pharmacy.  You will be getting half a tablet every 6 hours as needed for moderate to severe pain.  Be aware that this medication could cause drowsiness and increase your risk for falling.  Do not walk or get up to go to the bathroom without assistance.

## 2020-04-25 NOTE — ED Provider Notes (Signed)
[facility] Emergency Department Provider Note   ____________________________________________   None    (approximate)  I have reviewed the triage vital signs and the nursing notes.   HISTORY  Chief Complaint Dental Pain Patient and daughter  HPI Lisa Sosa is a 86 y.o. female presents to the ED with complaint of dental pain.  Daughter states that she was called by [facility] facility and told that her mother had been complaining of dental pain since 4 AM.      Past Medical History:  Diagnosis Date  . Cancer [facility]) 2011   gallbladder  . Chickenpox   . Diffuse large B cell lymphoma ([place]) 03/17/2015  . Elevated blood pressure   . Glaucoma   . History of blood transfusion     Patient Active Problem List   Diagnosis Date Noted  . Neck stiffness 05/03/2018  . Encounter for completion of form with patient 05/03/2018  . Hypotension due to hypovolemia 03/23/2018  . Depression, major, single episode, mild ([place]) 02/10/2017  . Weight loss 02/10/2017  . Right shoulder pain 02/10/2017  . Large cell lymphoma of lymph nodes of neck ([place]) 12/03/2016  . Bilateral lower extremity edema 05/13/2016  . History of pulmonary embolus (PE) 02/10/2016  . Diverticulitis of colon 02/10/2016  . Neuropathy 11/13/2015  . Prediabetes 11/13/2015  . Conjunctivitis 11/13/2015  . Acute cystitis 11/01/2012  . Calculus of kidney 11/01/2012  . 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 Malaise and fatigue 11/01/2012  . 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[place]) 11/01/2012  . Hematuria, microscopic 11/01/2012  . Excessive urination at night 11/01/2012  . Malignant neoplasm of lateral wall of urinary bladder ([place]) 11/01/2012  . Microscopic hematuria 11/01/2012  . Urge incontinence of urine 11/01/2012    Past Surgical History:  Procedure Laterality Date  . ABDOMINAL HYSTERECTOMY  1978   For fibroids  . BREAST BIOPSY Right 1990  . TRANSURETHRAL  RESECTION OF BLADDER TUMOR  2011     Prior to Admission medications   Medication Sig Start Date End Date Taking? Authorizing Provider  amoxicillin (AMOXIL) 500 MG capsule Take 1 capsule (500 mg total) by mouth 3 (three) times daily. 04/25/20   Johnn Hai, PA-C  brimonidine (ALPHAGAN) 0.2 % ophthalmic solution Place 1 drop into both eyes 2 (two) times daily.  02/03/16   [provider]  FeFum-FePo-FA-B Cmp-C-Zn-Mn-Cu (TANDEM PLUS) 162-115.2-1 MG CAPS Take 1 capsule by mouth daily. 07/11/19   Cammie Sickle, MD  latanoprost (XALATAN) 0.005 % ophthalmic solution Place 1 drop into both eyes at bedtime.  12/11/13   [provider]  lidocaine (XYLOCAINE) 2 % solution Apply to cotton ball and have patient put in to the area of pain on her teeth 04/25/20   Letitia Neri L, PA-C  timolol (TIMOPTIC) 0.5 % ophthalmic solution Place 1 drop into both eyes 2 (two) times daily.  02/03/16   [provider]  traMADol (ULTRAM) 50 MG tablet Take 0.5 tablets (25 mg total) by mouth every 6 (six) hours as needed for moderate pain or severe pain. 04/25/20   Johnn Hai, PA-C  XARELTO 10 MG TABS tablet TAKE 1 TABLET(10 MG) BY MOUTH DAILY 04/05/18   Cammie Sickle, MD    Allergies Iron and Codeine  Family History  Problem Relation Age of Onset  . Alcoholism Other   . Cancer - Lung Other        Parent  . Cancer - Ovarian Mother   .  Prostate cancer Other        Other relative  . Stroke Other        Other relative   . High blood pressure Other        Other relative  . Diabetes Other        Other relative    Social History Social History   Tobacco Use  . Smoking status: Never Smoker  . Smokeless tobacco: Never Used  Substance Use Topics  . Alcohol use: No    Alcohol/week: 0.0 standard drinks  . Drug use: No    Review of Systems Constitutional: No fever/chills Eyes: No visual changes. ENT: No sore throat.  For dental pain. Cardiovascular: Denies  chest pain. Respiratory: Denies shortness of breath. Gastrointestinal: No abdominal pain.  No nausea, no vomiting. Musculoskeletal: Negative for back pain. Skin: Negative for rash. Neurological: Negative for headaches, focal weakness or numbness.  ____________________________________________   PHYSICAL EXAM:  VITAL SIGNS: ED Triage Vitals [04/25/20 0448]  Enc Vitals Group     BP (!) 174/82     Pulse Rate 77     Resp 18     Temp 98.1 F (36.7 C)     Temp Source Oral     SpO2 98 %     Weight 112 lb (50.8 kg)     Height 5\' 6"  (1.676 m)     Head Circumference      Peak Flow      Pain Score      Pain Loc      Pain Edu?      Excl. in [place]?     Constitutional: Alert and oriented. Well appearing and in no acute distress. Eyes: Conjunctivae are normal. PERRL. EOMI. Head: Atraumatic. Nose: No congestion/rhinnorhea. Mouth/Throat: Mucous membranes are moist.  Oropharynx non-erythematous.  Right lower dental pain however there is marked pain on palpation of the right upper incisor and also premolars.  Poor dentition in this area.  No active drainage is noted. Neck: No stridor.   Cardiovascular: Normal rate, regular rhythm. Grossly normal heart sounds.  Good peripheral circulation. Respiratory: Normal respiratory effort.  No retractions. Lungs CTAB. Musculoskeletal: Patient is ambulatory with assistance. Neurologic:  Normal speech and language. No gross focal neurologic deficits are appreciated.  Skin:  Skin is warm, dry and intact. No rash noted. Psychiatric: Mood and affect are normal. Speech and behavior are normal.  ____________________________________________   LABS (all labs ordered are listed, but only abnormal results are displayed)  Labs Reviewed - No data to display  PROCEDURES  Procedure(s) performed (including Critical Care):  Procedures   ____________________________________________   INITIAL IMPRESSION / ASSESSMENT AND PLAN / ED COURSE  As part of my  medical decision making, I reviewed the following data within the electronic MEDICAL RECORD NUMBER Notes from prior ED visits and [employer] Controlled Substance Database   86 year old female presents to the ED accompanied by daughter with complaint of dental pain that began last evening but was worse at 4 AM today.  Patient has been taking Tylenol without any relief.  She denies any drainage.  Exam there is partial erosion of the right upper premolar and central incisor.  Gums are tender and also in the right lower gumline with mild edema.  No drainage is noted.  Patient was given amoxicillin 500 mg while in the ED and viscous lidocaine was used.  Patient states that this did not help with her pain.  Tramadol 25 mg was given to the patient.  Daughter and patient is aware that this medication could cause drowsiness and increase her risk for falling and therefore she would need assistance when up walking due to the increased risks.  Patient prescriptions were sent to her pharmacy.  She is to contact her dentist to be seen as soon as possible.  ____________________________________________   FINAL CLINICAL IMPRESSION(S) / ED DIAGNOSES  Final diagnoses:  Pain, dental     ED Discharge Orders         Ordered    amoxicillin (AMOXIL) 500 MG capsule  3 times daily     Discontinue  Reprint     04/25/20 0755    lidocaine (XYLOCAINE) 2 % solution     Discontinue  Reprint     04/25/20 0755    traMADol (ULTRAM) 50 MG tablet  Every 6 hours PRN     Discontinue  Reprint     04/25/20 0856           Note:  This document was prepared using Dragon voice recognition software and may include unintentional dictation errors.    Johnn Hai, PA-C 04/25/20 1619    Merlyn Lot, MD 04/26/20 [phone]

## 2020-04-25 NOTE — ED Triage Notes (Signed)
Patient ambulatory to triage with steady gait, without difficulty or distress noted accomp by daughter who reports pt with rt lower dental pain since yesterday

## 2020-05-27 DIAGNOSIS — H401133 Primary open-angle glaucoma, bilateral, severe stage: Secondary | ICD-10-CM | POA: Diagnosis not present

## 2020-06-03 DIAGNOSIS — H401133 Primary open-angle glaucoma, bilateral, severe stage: Secondary | ICD-10-CM | POA: Diagnosis not present

## 2020-06-04 ENCOUNTER — Inpatient Hospital Stay: Payer: PPO | Admitting: Internal Medicine

## 2020-06-04 ENCOUNTER — Inpatient Hospital Stay: Payer: PPO

## 2020-06-05 ENCOUNTER — Inpatient Hospital Stay: Payer: PPO | Attending: Internal Medicine

## 2020-06-05 ENCOUNTER — Encounter: Payer: Self-pay | Admitting: Internal Medicine

## 2020-06-05 ENCOUNTER — Inpatient Hospital Stay (HOSPITAL_BASED_OUTPATIENT_CLINIC_OR_DEPARTMENT_OTHER): Payer: PPO | Admitting: Internal Medicine

## 2020-06-05 ENCOUNTER — Other Ambulatory Visit: Payer: Self-pay

## 2020-06-05 DIAGNOSIS — Z8041 Family history of malignant neoplasm of ovary: Secondary | ICD-10-CM | POA: Insufficient documentation

## 2020-06-05 DIAGNOSIS — Z79899 Other long term (current) drug therapy: Secondary | ICD-10-CM | POA: Insufficient documentation

## 2020-06-05 DIAGNOSIS — C8331 Diffuse large B-cell lymphoma, lymph nodes of head, face, and neck: Secondary | ICD-10-CM | POA: Diagnosis not present

## 2020-06-05 DIAGNOSIS — C8581 Other specified types of non-Hodgkin lymphoma, lymph nodes of head, face, and neck: Secondary | ICD-10-CM

## 2020-06-05 DIAGNOSIS — N183 Chronic kidney disease, stage 3 unspecified: Secondary | ICD-10-CM | POA: Insufficient documentation

## 2020-06-05 DIAGNOSIS — D649 Anemia, unspecified: Secondary | ICD-10-CM | POA: Insufficient documentation

## 2020-06-05 DIAGNOSIS — Z8551 Personal history of malignant neoplasm of bladder: Secondary | ICD-10-CM | POA: Diagnosis not present

## 2020-06-05 DIAGNOSIS — Z86718 Personal history of other venous thrombosis and embolism: Secondary | ICD-10-CM | POA: Insufficient documentation

## 2020-06-05 DIAGNOSIS — Z801 Family history of malignant neoplasm of trachea, bronchus and lung: Secondary | ICD-10-CM | POA: Insufficient documentation

## 2020-06-05 DIAGNOSIS — Z86711 Personal history of pulmonary embolism: Secondary | ICD-10-CM | POA: Diagnosis not present

## 2020-06-05 DIAGNOSIS — Z7901 Long term (current) use of anticoagulants: Secondary | ICD-10-CM | POA: Insufficient documentation

## 2020-06-05 LAB — COMPREHENSIVE METABOLIC PANEL
ALT: 16 U/L (ref 0–44)
AST: 21 U/L (ref 15–41)
Albumin: 4.2 g/dL (ref 3.5–5.0)
Alkaline Phosphatase: 64 U/L (ref 38–126)
Anion gap: 7 (ref 5–15)
BUN: 25 mg/dL — ABNORMAL HIGH (ref 8–23)
CO2: 28 mmol/L (ref 22–32)
Calcium: 8.9 mg/dL (ref 8.9–10.3)
Chloride: 106 mmol/L (ref 98–111)
Creatinine, Ser: 1.09 mg/dL — ABNORMAL HIGH (ref 0.44–1.00)
GFR calc Af Amer: 50 mL/min — ABNORMAL LOW (ref >60)
GFR calc non Af Amer: 43 mL/min — ABNORMAL LOW (ref >60)
Glucose, Bld: 105 mg/dL — ABNORMAL HIGH (ref 70–99)
Potassium: 4.9 mmol/L (ref 3.5–5.1)
Sodium: 141 mmol/L (ref 135–145)
Total Bilirubin: 0.4 mg/dL (ref 0.3–1.2)
Total Protein: 7.1 g/dL (ref 6.5–8.1)

## 2020-06-05 LAB — CBC WITH DIFFERENTIAL/PLATELET
Abs Immature Granulocytes: 0.01 10*3/uL (ref 0.00–0.07)
Basophils Absolute: 0 10*3/uL (ref 0.0–0.1)
Basophils Relative: 1 %
Eosinophils Absolute: 0.1 10*3/uL (ref 0.0–0.5)
Eosinophils Relative: 3 %
HCT: 36 % (ref 36.0–46.0)
Hemoglobin: 11.9 g/dL — ABNORMAL LOW (ref 12.0–15.0)
Immature Granulocytes: 0 %
Lymphocytes Relative: 29 %
Lymphs Abs: 1.3 10*3/uL (ref 0.7–4.0)
MCH: 30.6 pg (ref 26.0–34.0)
MCHC: 33.1 g/dL (ref 30.0–36.0)
MCV: 92.5 fL (ref 80.0–100.0)
Monocytes Absolute: 0.4 10*3/uL (ref 0.1–1.0)
Monocytes Relative: 9 %
Neutro Abs: 2.5 10*3/uL (ref 1.7–7.7)
Neutrophils Relative %: 58 %
Platelets: 171 10*3/uL (ref 150–400)
RBC: 3.89 MIL/uL (ref 3.87–5.11)
RDW: 14.3 % (ref 11.5–15.5)
WBC: 4.3 10*3/uL (ref 4.0–10.5)
nRBC: 0 % (ref 0.0–0.2)

## 2020-06-05 NOTE — Assessment & Plan Note (Addendum)
Diffuse large B-cell lymphoma s/p Rituxan-  Clinically no evidence of progression noted. last Rituxan- June 2018.  Stable.  #  PET- DEC 2019  no evidence of lymphoma noted; however mild continued right hilar uptake [likely reactive]. STABLE; continue surviellaince without imaging.   # Right LE DVT/ History of PE [March 2017]- on Xarelto 10 mg/day. No falls. Discussed pro and cons of continued anticoagulation versus stopping.  After lengthy discussion it was decided to continue prophylactic dose of anticoagulation at this time.  # Mild Anemia-slightly lower than at baseline around 11; stable.  Continue p.o. iron.  # CKD- stage III-STABLE; GFR-43.   #DISPOSITION:  # follow up in 6 months-MD- /labs-cbc/cmp- Dr.B  # 25 minutes face-to-face with the patient discussing the above plan of care; more than 50% of time spent on prognosis/ natural history; counseling and coordination.

## 2020-06-05 NOTE — Progress Notes (Signed)
[place] OFFICE PROGRESS NOTE  Patient Care Team: [name], MD as PCP - General (Family Medicine) Christene Lye, MD as Consulting Physician (General Surgery) System, Provider Not In   Oncology History Overview Note  26.  86yo- year-old lady presented with large left neck mass and abdominal palpable mass.  Biopsy of left neck mass is being positive for diffuse B large cell lymphoma; Clinically staged as stage III (June, 2016) 2.  Patient did not want any chemotherapy so was started on prednisone followed by Rituxan in June of 2016 with excellent response 3.  Patient has finished 4 weekly cycle of rituximab on April 16, 2015 with excellent response  4.  Because of bulky disease in the past patient is getting maintenance Rituxan therapy started in September of 2016; STOPPED in June 2018.   # R LE DVT- was found to have a deep vein thrombosis on xeralto.  (March, 2017); And right-sided pulmonary emboli;l # AUG 2017- PET- NED.     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  female pleasant patient above history of Diffuse large B-cell lymphoma/DVT PE on Xarelto currently on surveillance is here for follow-up.  Patient continues to live in nursing home.  She denies any abdominal pain.  Denies any nausea vomiting.  No blood in stools or black or stools.  No lumps or bumps.  No falls.  No night sweats.  No weight loss.   Review of Systems  Constitutional: Negative for chills, diaphoresis, fever, malaise/fatigue and weight  loss.  HENT: Negative for nosebleeds and sore throat.   Eyes: Negative for double vision.  Respiratory: Negative for cough, hemoptysis, sputum production, shortness of breath and wheezing.   Cardiovascular: Negative for chest pain, palpitations, orthopnea and leg swelling.  Gastrointestinal: Negative for abdominal pain, blood in stool, constipation, diarrhea, heartburn, melena, nausea and vomiting.  Genitourinary: Negative for dysuria, frequency and urgency.  Musculoskeletal: Negative for back pain and joint pain.  Skin: Negative.  Negative for itching and rash.  Neurological: Negative for dizziness, tingling, focal weakness, weakness and headaches.  Endo/Heme/Allergies: Does not bruise/bleed easily.  Psychiatric/Behavioral: Negative for depression. The patient is not nervous/anxious and does not have insomnia.      PAST MEDICAL HISTORY :  Past Medical History:  Diagnosis Date   Cancer ([place]) 2011   gallbladder   Chickenpox    Diffuse large B cell lymphoma ([place]) 03/17/2015   Elevated blood pressure    Glaucoma    History of blood transfusion     PAST SURGICAL HISTORY :   Past Surgical History:  Procedure Laterality Date   ABDOMINAL HYSTERECTOMY  1978   For fibroids   BREAST BIOPSY Right 1990   TRANSURETHRAL RESECTION OF BLADDER TUMOR  2011     FAMILY HISTORY :   Family History  Problem Relation Age of Onset   Alcoholism Other    Cancer - Lung Other        Parent   Cancer - Ovarian Mother  Prostate cancer Other        Other relative   Stroke Other        Other relative    High blood pressure Other        Other relative   Diabetes Other        Other relative    SOCIAL HISTORY:   Social History   Tobacco Use   Smoking status: Never Smoker   Smokeless tobacco: Never Used  Substance Use Topics   Alcohol use: No    Alcohol/week: 0.0 standard drinks   Drug use: No    ALLERGIES:  is allergic to iron and codeine.  MEDICATIONS:  Current  Outpatient Medications  Medication Sig Dispense Refill   dorzolamide-timolol (COSOPT) 22.3-6.8 MG/ML ophthalmic solution 1 drop 2 (two) times daily.     FeFum-FePo-FA-B Cmp-C-Zn-Mn-Cu (TANDEM PLUS) 162-115.2-1 MG CAPS Take 1 capsule by mouth daily. 30 capsule 4   lidocaine (XYLOCAINE) 2 % solution Apply to cotton ball and have patient put in to the area of pain on her teeth 30 mL 0   XARELTO 10 MG TABS tablet TAKE 1 TABLET(10 MG) BY MOUTH DAILY 30 tablet 3   brimonidine (ALPHAGAN) 0.2 % ophthalmic solution Place 1 drop into both eyes 2 (two) times daily.  (Patient not taking: Reported on 06/05/2020)     latanoprost (XALATAN) 0.005 % ophthalmic solution Place 1 drop into both eyes at bedtime.  (Patient not taking: Reported on 06/05/2020)     timolol (TIMOPTIC) 0.5 % ophthalmic solution Place 1 drop into both eyes 2 (two) times daily.  (Patient not taking: Reported on 06/05/2020)     traMADol (ULTRAM) 50 MG tablet Take 0.5 tablets (25 mg total) by mouth every 6 (six) hours as needed for moderate pain or severe pain. (Patient not taking: Reported on 06/05/2020) 10 tablet 0   No current facility-administered medications for this visit.    PHYSICAL EXAMINATION: ECOG PERFORMANCE STATUS: 2 - Symptomatic, <50% confined to bed  BP (!) 121/50 (BP Location: Left Arm, Patient Position: Sitting, Cuff Size: Normal)    Pulse 65    Temp (!) 96.2 F (35.7 C) (Tympanic)    Resp 16    Ht 5\' 6"  (1.676 m)    Wt 117 lb (53.1 kg)    SpO2 100%    BMI 18.88 kg/m   Filed Weights   06/05/20 1447  Weight: 117 lb (53.1 kg)    Physical Exam Constitutional:      Comments: [place] Caucasian female patient.  She is accompanied by daughter.  She is walking with a rolling walker.  HENT:     Head: Normocephalic and atraumatic.     Mouth/Throat:     Pharynx: No oropharyngeal exudate.  Eyes:     Pupils: Pupils are equal, round, and reactive to light.  Cardiovascular:     Rate and Rhythm: Normal rate and  regular rhythm.  Pulmonary:     Effort: No respiratory distress.     Breath sounds: No wheezing.  Abdominal:     General: Bowel sounds are normal. There is no distension.     Palpations: Abdomen is soft. There is no mass.     Tenderness: There is no abdominal tenderness. There is no guarding or rebound.  Musculoskeletal:        General: No tenderness. Normal range of motion.     Cervical back: Normal range of motion and neck supple.  Skin:    General: Skin is warm.  Neurological:  Mental Status: She is alert and oriented to person, place, and time.  Psychiatric:        Mood and Affect: Affect normal.      LABORATORY DATA:  I have reviewed the data as listed    Component Value Date/Time   NA 141 06/05/2020 1435   K 4.9 06/05/2020 1435   CL 106 06/05/2020 1435   CO2 28 06/05/2020 1435   GLUCOSE 105 (H) 06/05/2020 1435   BUN 25 (H) 06/05/2020 1435   CREATININE 1.09 (H) 06/05/2020 1435   CALCIUM 8.9 06/05/2020 1435   PROT 7.1 06/05/2020 1435   ALBUMIN 4.2 06/05/2020 1435   AST 21 06/05/2020 1435   ALT 16 06/05/2020 1435   ALKPHOS 64 06/05/2020 1435   BILITOT 0.4 06/05/2020 1435   GFRNONAA 43 (L) 06/05/2020 1435   GFRAA 50 (L) 06/05/2020 1435    No results found for: SPEP, UPEP  Lab Results  Component Value Date   WBC 4.3 06/05/2020   NEUTROABS 2.5 06/05/2020   HGB 11.9 (L) 06/05/2020   HCT 36.0 06/05/2020   MCV 92.5 06/05/2020   PLT 171 06/05/2020      Chemistry      Component Value Date/Time   NA 141 06/05/2020 1435   K 4.9 06/05/2020 1435   CL 106 06/05/2020 1435   CO2 28 06/05/2020 1435   BUN 25 (H) 06/05/2020 1435   CREATININE 1.09 (H) 06/05/2020 1435      Component Value Date/Time   CALCIUM 8.9 06/05/2020 1435   ALKPHOS 64 06/05/2020 1435   AST 21 06/05/2020 1435   ALT 16 06/05/2020 1435   BILITOT 0.4 06/05/2020 1435       RADIOGRAPHIC STUDIES: I have personally reviewed the radiological images as listed and agreed with the findings in  the report. No results found.   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 last Rituxan- June 2018.  Stable.  #  PET- DEC 2019  no evidence of lymphoma noted; however mild continued right hilar uptake [likely reactive]. STABLE; continue surviellaince without imaging.   # Right LE DVT/ History of PE [March 2017]- on Xarelto 10 mg/day. No falls. Discussed pro and cons of continued anticoagulation versus stopping.  After lengthy discussion it was decided to continue prophylactic dose of anticoagulation at this time.  # Mild Anemia-slightly lower than at baseline around 11; stable.  Continue p.o. iron.  # CKD- stage III-STABLE; GFR-43.   #DISPOSITION:  # follow up in 6 months-MD- /labs-cbc/cmp- Dr.B  # 25 minutes face-to-face with the patient discussing the above plan of care; more than 50% of time spent on prognosis/ natural history; counseling and coordination.     Orders Placed This Encounter  Procedures   Comprehensive metabolic panel    Standing Status:   Future    Standing Expiration Date:   06/05/2021   CBC with Differential    Standing Status:   Future    Standing Expiration Date:   06/05/2021   All questions were answered. The patient knows to call the clinic with any problems, questions or concerns.      Cammie Sickle, MD 06/05/2020 5:08 PM

## 2020-07-02 ENCOUNTER — Telehealth: Payer: Self-pay

## 2020-07-02 NOTE — Telephone Encounter (Signed)
Faxed, confirmation given.  Jaydalynn Olivero,cma

## 2020-07-02 NOTE — Telephone Encounter (Signed)
Signed.  Please fax back. 

## 2020-07-25 DIAGNOSIS — H401133 Primary open-angle glaucoma, bilateral, severe stage: Secondary | ICD-10-CM | POA: Diagnosis not present

## 2020-08-19 ENCOUNTER — Other Ambulatory Visit: Payer: Self-pay | Admitting: *Deleted

## 2020-08-19 DIAGNOSIS — I2699 Other pulmonary embolism without acute cor pulmonale: Secondary | ICD-10-CM

## 2020-08-19 DIAGNOSIS — C8339 Diffuse large B-cell lymphoma, extranodal and solid organ sites: Secondary | ICD-10-CM

## 2020-08-19 MED ORDER — RIVAROXABAN 10 MG PO TABS: ORAL_TABLET | ORAL | 3 refills | Status: DC

## 2020-08-20 ENCOUNTER — Other Ambulatory Visit: Payer: Self-pay | Admitting: *Deleted

## 2020-08-20 MED ORDER — TANDEM PLUS 162-115.2-1 MG PO CAPS
1.0000 | ORAL_CAPSULE | Freq: Every day | ORAL | 4 refills | Status: DC
Start: 2020-08-20 — End: 2021-02-03

## 2020-11-27 ENCOUNTER — Ambulatory Visit (INDEPENDENT_AMBULATORY_CARE_PROVIDER_SITE_OTHER): Payer: PPO | Admitting: Family

## 2020-11-27 ENCOUNTER — Encounter: Payer: Self-pay | Admitting: Family

## 2020-11-27 ENCOUNTER — Ambulatory Visit: Payer: PPO | Admitting: Podiatry

## 2020-11-27 ENCOUNTER — Other Ambulatory Visit: Payer: Self-pay

## 2020-11-27 ENCOUNTER — Encounter: Payer: Self-pay | Admitting: Podiatry

## 2020-11-27 ENCOUNTER — Ambulatory Visit (INDEPENDENT_AMBULATORY_CARE_PROVIDER_SITE_OTHER): Payer: PPO

## 2020-11-27 VITALS — BP 100/62 | HR 76 | Temp 98.2°F | Ht 66.0 in | Wt 116.7 lb

## 2020-11-27 DIAGNOSIS — L601 Onycholysis: Secondary | ICD-10-CM

## 2020-11-27 DIAGNOSIS — M79674 Pain in right toe(s): Secondary | ICD-10-CM | POA: Insufficient documentation

## 2020-11-27 DIAGNOSIS — M79675 Pain in left toe(s): Secondary | ICD-10-CM | POA: Diagnosis not present

## 2020-11-27 DIAGNOSIS — B351 Tinea unguium: Secondary | ICD-10-CM

## 2020-11-27 DIAGNOSIS — M79671 Pain in right foot: Secondary | ICD-10-CM | POA: Diagnosis not present

## 2020-11-27 LAB — BASIC METABOLIC PANEL
BUN: 33 mg/dL — ABNORMAL HIGH (ref 6–23)
CO2: 27 mEq/L (ref 19–32)
Calcium: 9.2 mg/dL (ref 8.4–10.5)
Chloride: 108 mEq/L (ref 96–112)
Creatinine, Ser: 1.17 mg/dL (ref 0.40–1.20)
GFR: 39.63 mL/min — ABNORMAL LOW (ref >60.00)
Glucose, Bld: 106 mg/dL — ABNORMAL HIGH (ref 70–99)
Potassium: 4.9 mEq/L (ref 3.5–5.1)
Sodium: 142 mEq/L (ref 135–145)

## 2020-11-27 LAB — C-REACTIVE PROTEIN: CRP: <1 mg/dL (ref 0.5–20.0)

## 2020-11-27 LAB — URIC ACID: Uric Acid, Serum: 4.8 mg/dL (ref 2.4–7.0)

## 2020-11-27 LAB — SEDIMENTATION RATE: Sed Rate: 2 mm/hr (ref 0–30)

## 2020-11-27 MED ORDER — CEPHALEXIN 500 MG PO CAPS: 500.0000 mg | ORAL_CAPSULE | Freq: Two times a day (BID) | ORAL | 0 refills | Status: DC

## 2020-11-27 NOTE — Assessment & Plan Note (Addendum)
Differentials include injury ( pending xray), very early infection around toe nail bed, pain from onychomycosis and toe nail which is unattached. Pending labs xray. Out of an abundance of caution, I started keflex ( renally dosed) today for possible early cellulitis however I am most concerned with perhaps an injury. Regardless patient will need to podiatry to address keeping toe nail trimmed and possible removable in its entirety.  crtcl 66ml/min

## 2020-11-27 NOTE — Progress Notes (Signed)
Subjective:    Patient ID: Lisa Sosa, female    DOB: 1923-03-24, 87 y.o.   MRN: [id number]  CC: Lisa Sosa is a 87 y.o. female who presents today for an acute visit.    HPI: Right great toe 'soreness', 7 days, worsening.   Accompanied with daughter today whom is primary historian. Mothers lives in nursing facility and  Has memory problems per daughter.   Sore to walk not exquisitely painful. No purulent discharge or redness of toe. She doesn't recall bumping toe into anything or suffering from a fall. She wears atheletic shoes most of the time, sometimes house slippers. She has been doing soaking epsom salt. Toenail is loose.   H/o one episode cellutlitis.   No recent surgeries.   No leg swelling, calf pain, fever, N, V, sob.   H/o large cell lymphoma, PE- follows with Dr Rogue Bussing Compliant with Xarelto  No h/o gout.   H/o CKD ( crt 1.09 5 months ago), prediabetes       HISTORY:  Past Medical History:  Diagnosis Date  . Cancer [facility]) 2011   gallbladder  . Chickenpox   . Diffuse large B cell lymphoma ([place]) 03/17/2015  . Elevated blood pressure   . Glaucoma   . History of blood transfusion    Past Surgical History:  Procedure Laterality Date  . ABDOMINAL HYSTERECTOMY  1978   For fibroids  . BREAST BIOPSY Right 1990  . TRANSURETHRAL RESECTION OF BLADDER TUMOR  2011    Family History  Problem Relation Age of Onset  . Alcoholism Other   . Cancer - Lung Other        Parent  . Cancer - Ovarian Mother   . Prostate cancer Other        Other relative  . Stroke Other        Other relative   . High blood pressure Other        Other relative  . Diabetes Other        Other relative    Allergies: Iron and Codeine Current Outpatient Medications on File Prior to Visit  Medication Sig Dispense Refill  . brimonidine (ALPHAGAN) 0.2 % ophthalmic solution Place 1 drop into both eyes 2 (two) times daily.    . dorzolamide-timolol (COSOPT) 22.3-6.8 MG/ML ophthalmic  solution 1 drop 2 (two) times daily.    Mar[place] Kitchen FeFum-FePo-FA-B Cmp-C-Zn-Mn-Cu (TANDEM PLUS) 162-115.2-1 MG CAPS Take 1 capsule by mouth daily. 30 capsule 4  . latanoprost (XALATAN) 0.005 % ophthalmic solution Place 1 drop into both eyes at bedtime.    . rivaroxaban (XARELTO) 10 MG TABS tablet TAKE 1 TABLET(10 MG) BY MOUTH DAILY 30 tablet 3   No current facility-administered medications on file prior to visit.    Social History   Tobacco Use  . Smoking status: Never Smoker  . Smokeless tobacco: Never Used  Substance Use Topics  . Alcohol use: No    Alcohol/week: 0.0 standard drinks  . Drug use: No    Review of Systems  Constitutional: Negative for chills and fever.  Respiratory: Negative for cough.   Cardiovascular: Negative for chest pain, palpitations and leg swelling.  Gastrointestinal: Negative for nausea and vomiting.  Skin: Negative for rash and wound.      Objective:    BP 100/62   Pulse 76   Temp 98.2 F (36.8 C) (Oral)   Ht 5\' 6"  (1.676 m)   Wt 116 lb 11.2 oz (52.9 kg)   SpO2 98%  BMI 18.84 kg/m    Physical Exam Vitals reviewed.  Constitutional:      Appearance: She is well-developed and well-nourished.  Eyes:     Conjunctiva/sclera: Conjunctivae normal.  Cardiovascular:     Rate and Rhythm: Normal rate and regular rhythm.     Pulses: Normal pulses.     Heart sounds: Normal heart sounds.  Pulmonary:     Effort: Pulmonary effort is normal.     Breath sounds: Normal breath sounds. No wheezing, rhonchi or rales.  Musculoskeletal:     Right lower leg: No edema.     Left lower leg: No edema.     Right foot: Deformity present. No bony tenderness.     Comments: No erythema or exquisite tenderness of proximal phalanx of right great toe, more tenderness noted distally, at toe bed. Toil nail is thick,discolored and yellow appears floating on skin . She is able to stand however she appears uncomfortable when bearing of all weight on right foot.  Using walker.    Skin:    General: Skin is warm and dry.  Neurological:     Mental Status: She is alert.  Psychiatric:        Mood and Affect: Mood and affect normal.        Speech: Speech normal.        Behavior: Behavior normal.        Thought Content: Thought content normal.        Assessment & Plan:      I have discontinued [place] M. [place]'s timolol, lidocaine, and traMADol. I am also having her start on cephALEXin. Additionally, I am having her maintain her latanoprost, brimonidine, dorzolamide-timolol, rivaroxaban, and Tandem Plus.   Meds ordered this encounter  Medications  . cephALEXin (KEFLEX) 500 MG capsule    Sig: Take 1 capsule (500 mg total) by mouth in the morning and at bedtime.    Dispense:  14 capsule    Refill:  0    Order Specific Question:   Supervising Provider    Answer:   Crecencio Mc [[id number]]    Return precautions given.   Risks, benefits, and alternatives of the medications and treatment plan prescribed today were discussed, and patient expressed understanding.   Education regarding symptom management and diagnosis given to patient on AVS.  Continue to follow with [name], MD for routine health maintenance.   Alvira Philips and I agreed with plan.   Mable Paris, FNP

## 2020-11-27 NOTE — Patient Instructions (Signed)
Start antibiotic, keflex, as concern for early infection. We will call with results of xray and labs as well  Ensure to take probiotics while on antibiotics and also for 2 weeks after completion. It is important to re-colonize the gut with good bacteria and also to prevent any diarrheal infections associated with antibiotic use.   Referral to podiatry Let us know if you dont hear back within a week in regards to an appointment being scheduled.   Please let me know how you are doing

## 2020-12-01 ENCOUNTER — Encounter: Payer: Self-pay | Admitting: Podiatry

## 2020-12-01 NOTE — Progress Notes (Signed)
  Subjective:  Patient ID: Lisa Sosa, female    DOB: [birth year],  MRN: [id number]  Chief Complaint  Patient presents with  . Nail Problem    Patient presents today for thick toenail loose from nail bed right hallux.      87 y.o. female presents with the above complaint. History confirmed with patient.  Here with her daughter.  Toenail has become quite loose.  Her PCP gave her an antibiotic to take.  Feel like it catches on her socks.  Objective:  Physical Exam: warm, good capillary refill, no trophic changes or ulcerative lesions, normal DP and PT pulses and normal sensory exam.  Right hallux nail with onycholysis and onychomycosis with thickening of the nail plate yellow discoloration and has become loose distally.  Assessment:   1. Onychomycosis   2. Pain due to onychomycosis of toenails of both feet   3. Onycholysis      Plan:  Patient was evaluated and treated and all questions answered.  Discussed the etiology and treatment options for the condition in detail with the patient. Educated patient on the topical and oral treatment options for mycotic nails. Recommended debridement of the nails today. Sharp and mechanical debridement performed of all painful and mycotic nails today. Nails debrided in length and thickness using a nail nipper to level of comfort. Discussed treatment options including appropriate shoe gear. Follow up as needed for painful nails.  Do not think she needs to take the antibiotic as there is no evidence of bacterial infection  Return in about 4 months (around 03/27/2021).

## 2020-12-06 ENCOUNTER — Inpatient Hospital Stay (HOSPITAL_BASED_OUTPATIENT_CLINIC_OR_DEPARTMENT_OTHER): Payer: PPO | Admitting: Internal Medicine

## 2020-12-06 ENCOUNTER — Inpatient Hospital Stay: Payer: PPO | Attending: Internal Medicine

## 2020-12-06 ENCOUNTER — Other Ambulatory Visit: Payer: Self-pay

## 2020-12-06 ENCOUNTER — Encounter: Payer: Self-pay | Admitting: Internal Medicine

## 2020-12-06 DIAGNOSIS — D649 Anemia, unspecified: Secondary | ICD-10-CM | POA: Diagnosis not present

## 2020-12-06 DIAGNOSIS — C672 Malignant neoplasm of lateral wall of bladder: Secondary | ICD-10-CM | POA: Diagnosis not present

## 2020-12-06 DIAGNOSIS — Z801 Family history of malignant neoplasm of trachea, bronchus and lung: Secondary | ICD-10-CM | POA: Insufficient documentation

## 2020-12-06 DIAGNOSIS — Z7901 Long term (current) use of anticoagulants: Secondary | ICD-10-CM | POA: Insufficient documentation

## 2020-12-06 DIAGNOSIS — Z86711 Personal history of pulmonary embolism: Secondary | ICD-10-CM | POA: Diagnosis not present

## 2020-12-06 DIAGNOSIS — C8581 Other specified types of non-Hodgkin lymphoma, lymph nodes of head, face, and neck: Secondary | ICD-10-CM | POA: Diagnosis not present

## 2020-12-06 DIAGNOSIS — Z86718 Personal history of other venous thrombosis and embolism: Secondary | ICD-10-CM | POA: Diagnosis not present

## 2020-12-06 DIAGNOSIS — N183 Chronic kidney disease, stage 3 unspecified: Secondary | ICD-10-CM | POA: Diagnosis not present

## 2020-12-06 LAB — COMPREHENSIVE METABOLIC PANEL
ALT: 20 U/L (ref 0–44)
AST: 21 U/L (ref 15–41)
Albumin: 4.2 g/dL (ref 3.5–5.0)
Alkaline Phosphatase: 56 U/L (ref 38–126)
Anion gap: 8 (ref 5–15)
BUN: 30 mg/dL — ABNORMAL HIGH (ref 8–23)
CO2: 26 mmol/L (ref 22–32)
Calcium: 9 mg/dL (ref 8.9–10.3)
Chloride: 108 mmol/L (ref 98–111)
Creatinine, Ser: 1.14 mg/dL — ABNORMAL HIGH (ref 0.44–1.00)
GFR, Estimated: 44 mL/min — ABNORMAL LOW (ref >60)
Glucose, Bld: 103 mg/dL — ABNORMAL HIGH (ref 70–99)
Potassium: 4.4 mmol/L (ref 3.5–5.1)
Sodium: 142 mmol/L (ref 135–145)
Total Bilirubin: 0.7 mg/dL (ref 0.3–1.2)
Total Protein: 7.1 g/dL (ref 6.5–8.1)

## 2020-12-06 LAB — CBC WITH DIFFERENTIAL/PLATELET
Abs Immature Granulocytes: 0.02 10*3/uL (ref 0.00–0.07)
Basophils Absolute: 0 10*3/uL (ref 0.0–0.1)
Basophils Relative: 1 %
Eosinophils Absolute: 0.1 10*3/uL (ref 0.0–0.5)
Eosinophils Relative: 3 %
HCT: 35.3 % — ABNORMAL LOW (ref 36.0–46.0)
Hemoglobin: 11.4 g/dL — ABNORMAL LOW (ref 12.0–15.0)
Immature Granulocytes: 0 %
Lymphocytes Relative: 21 %
Lymphs Abs: 1 10*3/uL (ref 0.7–4.0)
MCH: 31.1 pg (ref 26.0–34.0)
MCHC: 32.3 g/dL (ref 30.0–36.0)
MCV: 96.4 fL (ref 80.0–100.0)
Monocytes Absolute: 0.3 10*3/uL (ref 0.1–1.0)
Monocytes Relative: 6 %
Neutro Abs: 3.4 10*3/uL (ref 1.7–7.7)
Neutrophils Relative %: 69 %
Platelets: 165 10*3/uL (ref 150–400)
RBC: 3.66 MIL/uL — ABNORMAL LOW (ref 3.87–5.11)
RDW: 14.5 % (ref 11.5–15.5)
WBC: 4.9 10*3/uL (ref 4.0–10.5)
nRBC: 0 % (ref 0.0–0.2)

## 2020-12-06 NOTE — Assessment & Plan Note (Addendum)
Diffuse large B-cell lymphoma s/p Rituxan-  Clinically no evidence of progression noted. last Rituxan- June 2018; PET- DEC 2019  no evidence of lymphoma noted; however mild continued right hilar uptake [likely reactive]. STABLE.   # Right LE DVT/ History of PE [March 2017]- on Xarelto 10 mg/day.1 falls. Discussed pro and cons of continued anticoagulation versus stopping.  After lengthy discussion it was decided to continue prophylactic dose of anticoagulation at this time.  Again recommend precautions against falling.  Daughter/patient understand fall on anticoagulation could be fatal.  # Mild Anemia-slightly lower than at baseline around 11; stable.  Continue p.o. iron.  # CKD- stage III-STABLE; GFR-43.   #DISPOSITION:  # follow up in 6 months-MD- /labs-cbc/cmp- Dr.B

## 2020-12-06 NOTE — Progress Notes (Signed)
Patient repots dental abscess in January. She has also had to have a tooth extraction in December.

## 2020-12-06 NOTE — Progress Notes (Signed)
[place] OFFICE PROGRESS NOTE  Patient Care Team: [name], MD as PCP - General (Family Medicine) Christene Lye, MD as Consulting Physician (General Surgery) System, Provider Not In   Oncology History Overview Note  70.  87yo- year-old lady presented with large left neck mass and abdominal palpable mass.  Biopsy of left neck mass is being positive for diffuse B large cell lymphoma; Clinically staged as stage III (June, 2016) 2.  Patient did not want any chemotherapy so was started on prednisone followed by Rituxan in June of 2016 with excellent response 3.  Patient has finished 4 weekly cycle of rituximab on April 16, 2015 with excellent response  4.  Because of bulky disease in the past patient is getting maintenance Rituxan therapy started in September of 2016; STOPPED in June 2018.   # R LE DVT- was found to have a deep vein thrombosis on xeralto.  (March, 2017); And right-sided pulmonary emboli;l # AUG 2017- PET- NED.     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  female pleasant patient above history of Diffuse large B-cell lymphoma/DVT PE on Xarelto currently on surveillance is here for follow-up.  Patient had episode of fall x1 in the bathroom.  Did not hit the head.  Did not have any bruises.  No lumps or bumps.  No night sweats.  No weight loss.  No hospitalizations.  Review of Systems  Constitutional: Negative for chills, diaphoresis, fever, malaise/fatigue and weight loss.  HENT: Negative for nosebleeds  and sore throat.   Eyes: Negative for double vision.  Respiratory: Negative for cough, hemoptysis, sputum production, shortness of breath and wheezing.   Cardiovascular: Negative for chest pain, palpitations, orthopnea and leg swelling.  Gastrointestinal: Negative for abdominal pain, blood in stool, constipation, diarrhea, heartburn, melena, nausea and vomiting.  Genitourinary: Negative for dysuria, frequency and urgency.  Musculoskeletal: Negative for back pain and joint pain.  Skin: Negative.  Negative for itching and rash.  Neurological: Negative for dizziness, tingling, focal weakness, weakness and headaches.  Endo/Heme/Allergies: Does not bruise/bleed easily.  Psychiatric/Behavioral: Negative for depression. The patient is not nervous/anxious and does not have insomnia.      PAST MEDICAL HISTORY :  Past Medical History:  Diagnosis Date  . Cancer [facility]) 2011   gallbladder  . Chickenpox   . Diffuse large B cell lymphoma ([place]) 03/17/2015  . Elevated blood pressure   . Glaucoma   . History of blood transfusion     PAST SURGICAL HISTORY :   Past Surgical History:  Procedure Laterality Date  . ABDOMINAL HYSTERECTOMY  1978   For fibroids  . BREAST BIOPSY Right 1990  . TRANSURETHRAL RESECTION OF BLADDER TUMOR  2011     FAMILY HISTORY :   Family History  Problem Relation Age of Onset  . Alcoholism Other   . Cancer - Lung Other        Parent  . Cancer - Ovarian Mother   . Prostate cancer Other  Other relative  . Stroke Other        Other relative   . High blood pressure Other        Other relative  . Diabetes Other        Other relative    SOCIAL HISTORY:   Social History   Tobacco Use  . Smoking status: Never Smoker  . Smokeless tobacco: Never Used  Substance Use Topics  . Alcohol use: No    Alcohol/week: 0.0 standard drinks  . Drug use: No    ALLERGIES:  is allergic to iron and codeine.  MEDICATIONS:  Current Outpatient Medications  Medication Sig  Dispense Refill  . brimonidine (ALPHAGAN) 0.2 % ophthalmic solution Place 1 drop into both eyes 2 (two) times daily.    . dorzolamide-timolol (COSOPT) 22.3-6.8 MG/ML ophthalmic solution 1 drop 2 (two) times daily.    Marland Kitchen FeFum-FePo-FA-B Cmp-C-Zn-Mn-Cu (TANDEM PLUS) 162-115.2-1 MG CAPS Take 1 capsule by mouth daily. 30 capsule 4  . latanoprost (XALATAN) 0.005 % ophthalmic solution Place 1 drop into both eyes at bedtime.    . rivaroxaban (XARELTO) 10 MG TABS tablet TAKE 1 TABLET(10 MG) BY MOUTH DAILY 30 tablet 3   No current facility-administered medications for this visit.    PHYSICAL EXAMINATION: ECOG PERFORMANCE STATUS: 2 - Symptomatic, <50% confined to bed  BP 118/68   Pulse (!) 59   Temp 97.6 F (36.4 C) (Tympanic)   Resp 18   Ht 5\' 6"  (1.676 m)   Wt 116 lb (52.6 kg)   BMI 18.72 kg/m   Filed Weights   12/06/20 1325  Weight: 116 lb (52.6 kg)    Physical Exam Constitutional:      Comments: [place] Caucasian female patient.  She is accompanied by daughter.  She is walking with a rolling walker.  HENT:     Head: Normocephalic and atraumatic.     Mouth/Throat:     Pharynx: No oropharyngeal exudate.  Eyes:     Pupils: Pupils are equal, round, and reactive to light.  Cardiovascular:     Rate and Rhythm: Normal rate and regular rhythm.  Pulmonary:     Effort: No respiratory distress.     Breath sounds: No wheezing.  Abdominal:     General: Bowel sounds are normal. There is no distension.     Palpations: Abdomen is soft. There is no mass.     Tenderness: There is no abdominal tenderness. There is no guarding or rebound.  Musculoskeletal:        General: No tenderness. Normal range of motion.     Cervical back: Normal range of motion and neck supple.  Skin:    General: Skin is warm.  Neurological:     Mental Status: She is alert and oriented to person, place, and time.  Psychiatric:        Mood and Affect: Affect normal.      LABORATORY DATA:  I have  reviewed the data as listed    Component Value Date/Time   NA 142 12/06/2020 1303   K 4.4 12/06/2020 1303   CL 108 12/06/2020 1303   CO2 26 12/06/2020 1303   GLUCOSE 103 (H) 12/06/2020 1303   BUN 30 (H) 12/06/2020 1303   CREATININE 1.14 (H) 12/06/2020 1303   CALCIUM 9.0 12/06/2020 1303   PROT 7.1 12/06/2020 1303   ALBUMIN 4.2 12/06/2020 1303   AST 21 12/06/2020 1303   ALT 20 12/06/2020 1303   ALKPHOS 56 12/06/2020 1303   BILITOT 0.7  12/06/2020 1303   GFRNONAA 44 (L) 12/06/2020 1303   GFRAA 50 (L) 06/05/2020 1435    No results found for: SPEP, UPEP  Lab Results  Component Value Date   WBC 4.9 12/06/2020   NEUTROABS 3.4 12/06/2020   HGB 11.4 (L) 12/06/2020   HCT 35.3 (L) 12/06/2020   MCV 96.4 12/06/2020   PLT 165 12/06/2020      Chemistry      Component Value Date/Time   NA 142 12/06/2020 1303   K 4.4 12/06/2020 1303   CL 108 12/06/2020 1303   CO2 26 12/06/2020 1303   BUN 30 (H) 12/06/2020 1303   CREATININE 1.14 (H) 12/06/2020 1303      Component Value Date/Time   CALCIUM 9.0 12/06/2020 1303   ALKPHOS 56 12/06/2020 1303   AST 21 12/06/2020 1303   ALT 20 12/06/2020 1303   BILITOT 0.7 12/06/2020 1303       RADIOGRAPHIC STUDIES: I have personally reviewed the radiological images as listed and agreed with the findings in the report. No results found.   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 last Rituxan- June 2018; PET- DEC 2019  no evidence of lymphoma noted; however mild continued right hilar uptake [likely reactive]. STABLE.   # Right LE DVT/ History of PE [March 2017]- on Xarelto 10 mg/day.1 falls. Discussed pro and cons of continued anticoagulation versus stopping.  After lengthy discussion it was decided to continue prophylactic dose of anticoagulation at this time.  Again recommend precautions against falling.  Daughter/patient understand  fall on anticoagulation could be fatal.  # Mild Anemia-slightly lower than at baseline around 11; stable.  Continue p.o. iron.  # CKD- stage III-STABLE; GFR-43.   #DISPOSITION:  # follow up in 6 months-MD- /labs-cbc/cmp- Dr.B      Orders Placed This Encounter  Procedures  . Comprehensive metabolic panel    Standing Status:   Future    Standing Expiration Date:   12/06/2021  . CBC with Differential    Standing Status:   Future    Standing Expiration Date:   12/06/2021   All questions were answered. The patient knows to call the clinic with any problems, questions or concerns.      Cammie Sickle, MD 12/06/2020 3:42 PM

## 2020-12-18 ENCOUNTER — Ambulatory Visit: Payer: PPO | Admitting: Podiatry

## 2020-12-26 DIAGNOSIS — H401133 Primary open-angle glaucoma, bilateral, severe stage: Secondary | ICD-10-CM | POA: Diagnosis not present

## 2020-12-30 ENCOUNTER — Other Ambulatory Visit: Payer: Self-pay | Admitting: Internal Medicine

## 2020-12-30 DIAGNOSIS — C8339 Diffuse large B-cell lymphoma, extranodal and solid organ sites: Secondary | ICD-10-CM

## 2020-12-30 DIAGNOSIS — I2699 Other pulmonary embolism without acute cor pulmonale: Secondary | ICD-10-CM

## 2021-02-03 ENCOUNTER — Other Ambulatory Visit: Payer: Self-pay | Admitting: Internal Medicine

## 2021-02-10 ENCOUNTER — Encounter: Payer: Self-pay | Admitting: Podiatry

## 2021-02-10 ENCOUNTER — Ambulatory Visit: Payer: PPO | Admitting: Podiatry

## 2021-02-10 ENCOUNTER — Other Ambulatory Visit: Payer: Self-pay

## 2021-02-10 DIAGNOSIS — L84 Corns and callosities: Secondary | ICD-10-CM

## 2021-02-10 DIAGNOSIS — M79674 Pain in right toe(s): Secondary | ICD-10-CM

## 2021-02-10 DIAGNOSIS — M79675 Pain in left toe(s): Secondary | ICD-10-CM | POA: Diagnosis not present

## 2021-02-10 DIAGNOSIS — B351 Tinea unguium: Secondary | ICD-10-CM

## 2021-02-10 NOTE — Progress Notes (Signed)
  Subjective:  Patient ID: Lisa Sosa, female    DOB: 09-09-1923,  MRN: [id number]  Chief Complaint  Patient presents with  . Nail Problem    Patient still c/o pain and tenderness on the tip of right hallux nail.  She says its sore to touch and her nail grows up    87 y.o. female returns with the above complaint. History confirmed with patient.  Here with her daughter.  Toe is still giving her pain Objective:  Physical Exam: warm, good capillary refill, no trophic changes or ulcerative lesions, normal DP and PT pulses and normal sensory exam.  Thickening of the nail plate and callus on the distal medial hallux  Assessment:   1. Callus of foot   2. Onychomycosis   3. Pain due to onychomycosis of toenails of both feet      Plan:  Patient was evaluated and treated and all questions answered.  I again debrided the nail in thickness and length using a sharp nail nipper to reduce the onychomycotic burden.  Also debrided the callus with a chisel blade.  Discussed with the patient and her daughter that this likely will need for routine regular care to keep it comfortable.  Advised she should be wearing the silicone toe cap that I dispensed to her.  Return in about 10 weeks (around 04/21/2021) for routine foot care.

## 2021-04-01 DIAGNOSIS — H26492 Other secondary cataract, left eye: Secondary | ICD-10-CM | POA: Diagnosis not present

## 2021-04-01 DIAGNOSIS — H353131 Nonexudative age-related macular degeneration, bilateral, early dry stage: Secondary | ICD-10-CM | POA: Diagnosis not present

## 2021-04-24 ENCOUNTER — Ambulatory Visit: Payer: PPO | Admitting: Podiatry

## 2021-05-04 ENCOUNTER — Other Ambulatory Visit: Payer: Self-pay | Admitting: Internal Medicine

## 2021-05-04 DIAGNOSIS — I2699 Other pulmonary embolism without acute cor pulmonale: Secondary | ICD-10-CM

## 2021-05-04 DIAGNOSIS — C8339 Diffuse large B-cell lymphoma, extranodal and solid organ sites: Secondary | ICD-10-CM

## 2021-05-06 DIAGNOSIS — H26492 Other secondary cataract, left eye: Secondary | ICD-10-CM | POA: Diagnosis not present

## 2021-05-08 ENCOUNTER — Other Ambulatory Visit: Payer: Self-pay | Admitting: *Deleted

## 2021-05-08 ENCOUNTER — Other Ambulatory Visit: Payer: Self-pay

## 2021-05-08 DIAGNOSIS — C8339 Diffuse large B-cell lymphoma, extranodal and solid organ sites: Secondary | ICD-10-CM

## 2021-05-08 DIAGNOSIS — I2699 Other pulmonary embolism without acute cor pulmonale: Secondary | ICD-10-CM

## 2021-05-08 MED ORDER — RIVAROXABAN 10 MG PO TABS: ORAL_TABLET | ORAL | 3 refills | Status: DC

## 2021-05-08 NOTE — Telephone Encounter (Signed)
Daughter has called again today needing this medicine refilled

## 2021-06-04 DIAGNOSIS — H401133 Primary open-angle glaucoma, bilateral, severe stage: Secondary | ICD-10-CM | POA: Diagnosis not present

## 2021-06-06 ENCOUNTER — Inpatient Hospital Stay: Payer: PPO | Attending: Internal Medicine

## 2021-06-06 ENCOUNTER — Other Ambulatory Visit: Payer: Self-pay

## 2021-06-06 ENCOUNTER — Encounter: Payer: Self-pay | Admitting: Internal Medicine

## 2021-06-06 ENCOUNTER — Inpatient Hospital Stay: Payer: PPO | Admitting: Internal Medicine

## 2021-06-06 DIAGNOSIS — R634 Abnormal weight loss: Secondary | ICD-10-CM | POA: Diagnosis not present

## 2021-06-06 DIAGNOSIS — C8338 Diffuse large B-cell lymphoma, lymph nodes of multiple sites: Secondary | ICD-10-CM | POA: Insufficient documentation

## 2021-06-06 DIAGNOSIS — Z86711 Personal history of pulmonary embolism: Secondary | ICD-10-CM | POA: Insufficient documentation

## 2021-06-06 DIAGNOSIS — N183 Chronic kidney disease, stage 3 unspecified: Secondary | ICD-10-CM | POA: Insufficient documentation

## 2021-06-06 DIAGNOSIS — Z86718 Personal history of other venous thrombosis and embolism: Secondary | ICD-10-CM | POA: Diagnosis not present

## 2021-06-06 DIAGNOSIS — F0390 Unspecified dementia without behavioral disturbance: Secondary | ICD-10-CM | POA: Insufficient documentation

## 2021-06-06 DIAGNOSIS — C8581 Other specified types of non-Hodgkin lymphoma, lymph nodes of head, face, and neck: Secondary | ICD-10-CM

## 2021-06-06 DIAGNOSIS — Z681 Body mass index (BMI) 19 or less, adult: Secondary | ICD-10-CM | POA: Diagnosis not present

## 2021-06-06 LAB — COMPREHENSIVE METABOLIC PANEL
ALT: 16 U/L (ref 0–44)
AST: 17 U/L (ref 15–41)
Albumin: 3.7 g/dL (ref 3.5–5.0)
Alkaline Phosphatase: 54 U/L (ref 38–126)
Anion gap: 8 (ref 5–15)
BUN: 33 mg/dL — ABNORMAL HIGH (ref 8–23)
CO2: 26 mmol/L (ref 22–32)
Calcium: 9 mg/dL (ref 8.9–10.3)
Chloride: 106 mmol/L (ref 98–111)
Creatinine, Ser: 1.19 mg/dL — ABNORMAL HIGH (ref 0.44–1.00)
GFR, Estimated: 42 mL/min — ABNORMAL LOW (ref >60)
Glucose, Bld: 104 mg/dL — ABNORMAL HIGH (ref 70–99)
Potassium: 4.1 mmol/L (ref 3.5–5.1)
Sodium: 140 mmol/L (ref 135–145)
Total Bilirubin: 0.5 mg/dL (ref 0.3–1.2)
Total Protein: 6.7 g/dL (ref 6.5–8.1)

## 2021-06-06 LAB — CBC WITH DIFFERENTIAL/PLATELET
Abs Immature Granulocytes: 0.02 10*3/uL (ref 0.00–0.07)
Basophils Absolute: 0 10*3/uL (ref 0.0–0.1)
Basophils Relative: 0 %
Eosinophils Absolute: 0.1 10*3/uL (ref 0.0–0.5)
Eosinophils Relative: 2 %
HCT: 33 % — ABNORMAL LOW (ref 36.0–46.0)
Hemoglobin: 10.7 g/dL — ABNORMAL LOW (ref 12.0–15.0)
Immature Granulocytes: 0 %
Lymphocytes Relative: 16 %
Lymphs Abs: 0.8 10*3/uL (ref 0.7–4.0)
MCH: 30.7 pg (ref 26.0–34.0)
MCHC: 32.4 g/dL (ref 30.0–36.0)
MCV: 94.6 fL (ref 80.0–100.0)
Monocytes Absolute: 0.6 10*3/uL (ref 0.1–1.0)
Monocytes Relative: 11 %
Neutro Abs: 3.7 10*3/uL (ref 1.7–7.7)
Neutrophils Relative %: 71 %
Platelets: 157 10*3/uL (ref 150–400)
RBC: 3.49 MIL/uL — ABNORMAL LOW (ref 3.87–5.11)
RDW: 13.5 % (ref 11.5–15.5)
WBC: 5.3 10*3/uL (ref 4.0–10.5)
nRBC: 0 % (ref 0.0–0.2)

## 2021-06-06 LAB — IRON AND TIBC
Iron: 26 ug/dL — ABNORMAL LOW (ref 28–170)
Saturation Ratios: 9 % — ABNORMAL LOW (ref 10.4–31.8)
TIBC: 287 ug/dL (ref 250–450)
UIBC: 261 ug/dL

## 2021-06-06 LAB — FERRITIN: Ferritin: 64 ng/mL (ref 11–307)

## 2021-06-06 NOTE — Assessment & Plan Note (Signed)
Diffuse large B-cell lymphoma s/p Rituxan-  Clinically no evidence of progression noted. last Rituxan- June 2018; PET- DEC 2019  no evidence of lymphoma noted; however mild continued right hilar uptake [likely reactive]. STABLE.   # weight loss [7 pounds]-declines joli referral; protein shakes/boost; ? Dementia. Defer to PCP.   # Right LE DVT/ History of PE [March 2017]- on Xarelto 10 mg/day.1 more falls. Discussed pro and cons of continued anticoagulation versus stopping.  After lengthy discussion it was decided to DIScontinue prophylactic dose of anticoagulation at this time.   Daughter/patient understand fall on anticoagulation could be fatal.  # Mild Anemia-slightly lower than at baseline around  10.7- slightly worse; check iron studies/ferritin .  Continue p.o. iron.  # CKD- stage III- STABLE GFR-42.   #DISPOSITION:  check irons tudies/ferritin .   # follow up in 6 months-MD- /labs-cbc/cmp- Dr.B

## 2021-06-08 NOTE — Progress Notes (Signed)
[place] OFFICE PROGRESS NOTE  Patient Care Team: [name], MD as PCP - General (Family Medicine) Christene Lye, MD as Consulting Physician (General Surgery) System, Provider Not In   Oncology History Overview Note  53.  87yo- year-old lady presented with large left neck mass and abdominal palpable mass.  Biopsy of left neck mass is being positive for diffuse B large cell lymphoma; Clinically staged as stage III (June, 2016) 2.  Patient did not want any chemotherapy so was started on prednisone followed by Rituxan in June of 2016 with excellent response 3.  Patient has finished 4 weekly cycle of rituximab on April 16, 2015 with excellent response  4.  Because of bulky disease in the past patient is getting maintenance Rituxan therapy started in September of 2016; STOPPED in June 2018.   # R LE DVT- was found to have a deep vein thrombosis on xeralto.  (March, 2017); And right-sided pulmonary emboli;SEP 2022- STOP xarelto [sec to falls- dementia]  # AUG 2017- PET- NED.     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  female pleasant patient above history of Diffuse large B-cell lymphoma/DVT PE on Xarelto; mild dementia currently on surveillance is here for follow-up.  Patient had episode of fall x1 in the bedroom.  Did not hit the head.  Did not have any bruises.  No lumps or bumps.  No night sweats.  No weight loss.  No hospitalizations.  Review of Systems  Constitutional:  Negative for chills, diaphoresis, fever,  malaise/fatigue and weight loss.  HENT:  Negative for nosebleeds and sore throat.   Eyes:  Negative for double vision.  Respiratory:  Negative for cough, hemoptysis, sputum production, shortness of breath and wheezing.   Cardiovascular:  Negative for chest pain, palpitations, orthopnea and leg swelling.  Gastrointestinal:  Negative for abdominal pain, blood in stool, constipation, diarrhea, heartburn, melena, nausea and vomiting.  Genitourinary:  Negative for dysuria, frequency and urgency.  Musculoskeletal:  Negative for back pain and joint pain.  Skin: Negative.  Negative for itching and rash.  Neurological:  Negative for dizziness, tingling, focal weakness, weakness and headaches.  Endo/Heme/Allergies:  Does not bruise/bleed easily.  Psychiatric/Behavioral:  Negative for depression. The patient is not nervous/anxious and does not have insomnia.     PAST MEDICAL HISTORY :  Past Medical History:  Diagnosis Date   Cancer ([place]) 2011   gallbladder   Chickenpox    Diffuse large B cell lymphoma ([facility]) 03/17/2015   Elevated blood pressure    Glaucoma    History of blood transfusion     PAST SURGICAL HISTORY :   Past Surgical History:  Procedure Laterality Date   ABDOMINAL HYSTERECTOMY  1978   For fibroids   BREAST BIOPSY Right 1990   TRANSURETHRAL RESECTION OF BLADDER TUMOR  2011     FAMILY HISTORY :   Family History  Problem Relation Age of Onset   Alcoholism Other    Cancer - Lung Other  Parent   Cancer - Ovarian Mother    Prostate cancer Other        Other relative   Stroke Other        Other relative    High blood pressure Other        Other relative   Diabetes Other        Other relative    SOCIAL HISTORY:   Social History   Tobacco Use   Smoking status: Never   Smokeless tobacco: Never  Substance Use Topics   Alcohol use: No    Alcohol/week: 0.0 standard drinks   Drug use: No    ALLERGIES:  is allergic to iron and codeine.  MEDICATIONS:  Current  Outpatient Medications  Medication Sig Dispense Refill   brimonidine (ALPHAGAN) 0.2 % ophthalmic solution Place 1 drop into both eyes 2 (two) times daily.     dorzolamide-timolol (COSOPT) 22.3-6.8 MG/ML ophthalmic solution 1 drop 2 (two) times daily.     FeFum-FePo-FA-B Cmp-C-Zn-Mn-Cu (SE-TAN PLUS) 162-115.2-1 MG CAPS TAKE 1 CAPSULE BY MOUTH EVERY DAY 30 capsule 4   latanoprost (XALATAN) 0.005 % ophthalmic solution Place 1 drop into both eyes at bedtime.     rivaroxaban (XARELTO) 10 MG TABS tablet TAKE 1 TABLET(10 MG) BY MOUTH DAILY 30 tablet 3   No current facility-administered medications for this visit.    PHYSICAL EXAMINATION: ECOG PERFORMANCE STATUS: 2 - Symptomatic, <50% confined to bed  BP (!) 132/44   Pulse 72   Temp 97.8 F (36.6 C)   Resp 20   Wt 109 lb 12.8 oz (49.8 kg)   SpO2 100%   BMI 17.72 kg/m   Filed Weights   06/06/21 1335  Weight: 109 lb 12.8 oz (49.8 kg)    Physical Exam Constitutional:      Comments: Frail-appearing Caucasian female patient.  She is accompanied by daughter.  She is walking with a rolling walker.  HENT:     Head: Normocephalic and atraumatic.     Mouth/Throat:     Pharynx: No oropharyngeal exudate.  Eyes:     Pupils: Pupils are equal, round, and reactive to light.  Cardiovascular:     Rate and Rhythm: Normal rate and regular rhythm.  Pulmonary:     Effort: No respiratory distress.     Breath sounds: No wheezing.  Abdominal:     General: Bowel sounds are normal. There is no distension.     Palpations: Abdomen is soft. There is no mass.     Tenderness: There is no abdominal tenderness. There is no guarding or rebound.  Musculoskeletal:        General: No tenderness. Normal range of motion.     Cervical back: Normal range of motion and neck supple.  Skin:    General: Skin is warm.  Neurological:     Mental Status: She is alert and oriented to person, place, and time.  Psychiatric:        Mood and Affect: Affect normal.      LABORATORY DATA:  I have reviewed the data as listed    Component Value Date/Time   NA 140 06/06/2021 1322   K 4.1 06/06/2021 1322   CL 106 06/06/2021 1322   CO2 26 06/06/2021 1322   GLUCOSE 104 (H) 06/06/2021 1322   BUN 33 (H) 06/06/2021 1322   CREATININE 1.19 (H) 06/06/2021 1322   CALCIUM 9.0 06/06/2021 1322   PROT 6.7 06/06/2021 1322   ALBUMIN 3.7 06/06/2021 1322   AST 17  06/06/2021 1322   ALT 16 06/06/2021 1322   ALKPHOS 54 06/06/2021 1322   BILITOT 0.5 06/06/2021 1322   GFRNONAA 42 (L) 06/06/2021 1322   GFRAA 50 (L) 06/05/2020 1435    No results found for: SPEP, UPEP  Lab Results  Component Value Date   WBC 5.3 06/06/2021   NEUTROABS 3.7 06/06/2021   HGB 10.7 (L) 06/06/2021   HCT 33.0 (L) 06/06/2021   MCV 94.6 06/06/2021   PLT 157 06/06/2021      Chemistry      Component Value Date/Time   NA 140 06/06/2021 1322   K 4.1 06/06/2021 1322   CL 106 06/06/2021 1322   CO2 26 06/06/2021 1322   BUN 33 (H) 06/06/2021 1322   CREATININE 1.19 (H) 06/06/2021 1322      Component Value Date/Time   CALCIUM 9.0 06/06/2021 1322   ALKPHOS 54 06/06/2021 1322   AST 17 06/06/2021 1322   ALT 16 06/06/2021 1322   BILITOT 0.5 06/06/2021 1322       RADIOGRAPHIC STUDIES: I have personally reviewed the radiological images as listed and agreed with the findings in the report. No results found.   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 last Rituxan- June 2018; PET- DEC 2019  no evidence of lymphoma noted; however mild continued right hilar uptake [likely reactive]. STABLE.   # weight loss [7 pounds]-declines joli referral; protein shakes/boost; ? Dementia. Defer to PCP.   # Right LE DVT/ History of PE [March 2017]- on Xarelto 10 mg/day.1 more falls. Discussed pro and cons of continued anticoagulation versus stopping.  After lengthy discussion it was  decided to DIScontinue prophylactic dose of anticoagulation at this time.   Daughter/patient understand fall on anticoagulation could be fatal.  # Mild Anemia-slightly lower than at baseline around  10.7- slightly worse; check iron studies/ferritin .  Continue p.o. iron.  # CKD- stage III- STABLE GFR-42.   #DISPOSITION:  check irons tudies/ferritin .   # follow up in 6 months-MD- /labs-cbc/cmp- Dr.B     Orders Placed This Encounter  Procedures   CBC with Differential    Standing Status:   Future    Standing Expiration Date:   06/06/2022   Comprehensive metabolic panel    Standing Status:   Future    Standing Expiration Date:   06/06/2022   Ferritin    Standing Status:   Future    Number of Occurrences:   1    Standing Expiration Date:   06/06/2022   Iron and TIBC    Standing Status:   Future    Number of Occurrences:   1    Standing Expiration Date:   06/06/2022   All questions were answered. The patient knows to call the clinic with any problems, questions or concerns.      Cammie Sickle, MD 06/08/2021 9:21 PM

## 2021-06-10 DIAGNOSIS — H401133 Primary open-angle glaucoma, bilateral, severe stage: Secondary | ICD-10-CM | POA: Diagnosis not present

## 2021-06-11 ENCOUNTER — Telehealth: Payer: Self-pay | Admitting: Family Medicine

## 2021-06-11 NOTE — Telephone Encounter (Signed)
Generalized weakness and wetting self before she can get to bathroom,  she fell trying to get out of bed last week daughter said on Thursday or Friday. Patient daughter says urine has a strong odor, has seen Dr. Rogue Bussing since last fall he took her off Xarelto due to fall. Advised no appointments available in office and that patient needs to be evaluated at either and Urgent Care or [facility] walk[facility] daughter started she cannot get her there to day but would definitely get her there tomorrow, advised not to wait sooner would be better she stated she would try. Looking at labs Kidney function down and HGB from 06/06/21

## 2021-06-11 NOTE — Telephone Encounter (Signed)
I agree. She needs to be seen by urgent care or the walk in clinic as soon as they can take.

## 2021-06-11 NOTE — Telephone Encounter (Signed)
Patient informed, Due to the high volume of calls and your symptoms we have to forward your call to our Triage Nurse to expedient your call. Please hold for the transfer.  Patient transferred to Lisa Sosa at Access Nurse. Due to feeling weaker,not eating as well as urinating on herself.She has urinated on herself before in the past but the urine currently has an odor.No openings in office or virtual.

## 2021-07-09 ENCOUNTER — Other Ambulatory Visit: Payer: Self-pay | Admitting: Internal Medicine

## 2021-09-19 ENCOUNTER — Encounter: Payer: Self-pay | Admitting: Family Medicine

## 2021-09-19 ENCOUNTER — Ambulatory Visit (INDEPENDENT_AMBULATORY_CARE_PROVIDER_SITE_OTHER): Payer: PPO | Admitting: Family Medicine

## 2021-09-19 ENCOUNTER — Other Ambulatory Visit: Payer: Self-pay

## 2021-09-19 VITALS — BP 110/70 | HR 64 | Temp 97.6°F | Ht 66.0 in | Wt 108.0 lb

## 2021-09-19 DIAGNOSIS — Z711 Person with feared health complaint in whom no diagnosis is made: Secondary | ICD-10-CM | POA: Insufficient documentation

## 2021-09-19 DIAGNOSIS — R413 Other amnesia: Secondary | ICD-10-CM | POA: Diagnosis not present

## 2021-09-19 DIAGNOSIS — I951 Orthostatic hypotension: Secondary | ICD-10-CM | POA: Insufficient documentation

## 2021-09-19 DIAGNOSIS — H9193 Unspecified hearing loss, bilateral: Secondary | ICD-10-CM

## 2021-09-19 DIAGNOSIS — R2689 Other abnormalities of gait and mobility: Secondary | ICD-10-CM | POA: Insufficient documentation

## 2021-09-19 LAB — CBC
HCT: 36 % (ref 36.0–46.0)
Hemoglobin: 11.8 g/dL — ABNORMAL LOW (ref 12.0–15.0)
MCHC: 32.9 g/dL (ref 30.0–36.0)
MCV: 92.3 fl (ref 78.0–100.0)
Platelets: 183 10*3/uL (ref 150.0–400.0)
RBC: 3.9 Mil/uL (ref 3.87–5.11)
RDW: 15.3 % (ref 11.5–15.5)
WBC: 5.3 10*3/uL (ref 4.0–10.5)

## 2021-09-19 LAB — COMPREHENSIVE METABOLIC PANEL
ALT: 16 U/L (ref 0–35)
AST: 19 U/L (ref 0–37)
Albumin: 3.9 g/dL (ref 3.5–5.2)
Alkaline Phosphatase: 53 U/L (ref 39–117)
BUN: 38 mg/dL — ABNORMAL HIGH (ref 6–23)
CO2: 25 mEq/L (ref 19–32)
Calcium: 9.5 mg/dL (ref 8.4–10.5)
Chloride: 107 mEq/L (ref 96–112)
Creatinine, Ser: 1.18 mg/dL (ref 0.40–1.20)
GFR: 39 mL/min — ABNORMAL LOW (ref >60.00)
Glucose, Bld: 94 mg/dL (ref 70–99)
Potassium: 4.3 mEq/L (ref 3.5–5.1)
Sodium: 140 mEq/L (ref 135–145)
Total Bilirubin: 0.5 mg/dL (ref 0.2–1.2)
Total Protein: 6.6 g/dL (ref 6.0–8.3)

## 2021-09-19 LAB — TSH: TSH: 3.07 u[IU]/mL (ref 0.35–5.50)

## 2021-09-19 LAB — VITAMIN B12: Vitamin B-12: 270 pg/mL (ref 211–911)

## 2021-09-19 NOTE — Assessment & Plan Note (Signed)
This is an ongoing issue that seems to have worsened recently.  Discussed seeing ENT.  Discussed potential need for an MRI to evaluate for a cause though I will defer that to ENT.  Referral placed.

## 2021-09-19 NOTE — Progress Notes (Signed)
Tommi Rumps, MD Phone: [phone]  Lisa Sosa is a 87 y.o. female who presents today for same day visit.   Hearing loss/balance difficulty: The patient's daughter notes a couple of months of hearing loss.  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  The right ear seems to be much worse.  There seems to be some trouble comprehending what is being said to her as well.  They do note some memory difficulty.  She has had some balance issues and has had several falls.  They are unsure if the falls are related to the balance difficulty.  The patient reports occasional possible lightheadedness or vertigo.  She fell once after having a tooth pulled at the dentist and felt weak once they got home from.  The second time she was in the bathroom and had an unwitnessed fall though the patient reported no injuries or head injury.  She has some tinnitus.  No ear fullness.  No depression.  Social History   Tobacco Use  Smoking Status Never  Smokeless Tobacco Never    Current Outpatient Medications on File Prior to Visit  Medication Sig Dispense Refill   brimonidine (ALPHAGAN) 0.2 % ophthalmic solution Place 1 drop into both eyes 2 (two) times daily.     dorzolamide-timolol (COSOPT) 22.3-6.8 MG/ML ophthalmic solution 1 drop 2 (two) times daily.     FeFum-FePo-FA-B Cmp-C-Zn-Mn-Cu (SE-TAN PLUS) 162-115.2-1 MG CAPS TAKE 1 CAPSULE BY MOUTH EVERY DAY 30 capsule 4   latanoprost (XALATAN) 0.005 % ophthalmic solution Place 1 drop into both eyes at bedtime.     rivaroxaban (XARELTO) 10 MG TABS tablet TAKE 1 TABLET(10 MG) BY MOUTH DAILY (Patient not taking: Reported on 09/19/2021) 30 tablet 3   No current facility-administered medications on file prior to visit.     ROS see history of present illness  Objective  Physical Exam Vitals:   09/19/21 1110  BP: 110/70  Pulse: 64  Temp: 97.6 F (36.4 C)  SpO2: 99%   Lying blood pressure 103/61 pulse  61 Sitting blood pressure 97/61 pulse 65 Standing blood pressure 81/52 pulse 72  BP Readings from Last 3 Encounters:  09/19/21 110/70  06/06/21 (!) 132/44  12/06/20 118/68   Wt Readings from Last 3 Encounters:  09/19/21 108 lb (49 kg)  06/06/21 109 lb 12.8 oz (49.8 kg)  12/06/20 116 lb (52.6 kg)    Physical Exam Constitutional:      General: She is not in acute distress.    Appearance: She is not diaphoretic.  HENT:     Right Ear: Tympanic membrane normal.     Left Ear: Tympanic membrane normal.     Ears:     Comments: There is a small amount of cerumen obscuring the TMs bilaterally, this was irrigated by the CMA and tolerated well by the patient.  TMs were normal after irrigation Cardiovascular:     Rate and Rhythm: Normal rate and regular rhythm.     Heart sounds: Normal heart sounds.  Pulmonary:     Effort: Pulmonary effort is normal.     Breath sounds: Normal breath sounds.  Musculoskeletal:     Right lower leg: No edema.     Left lower leg: No edema.  Skin:    General: Skin is warm and dry.  Neurological:     Mental Status: She is alert.     Comments: EOMI, PERRL, opens or closes eyes adequately, V1 through V3  intact light touch, hearing reduced to finger rub, shoulder shrug intact, 5/5 strength in bilateral biceps, triceps, grip, quads, hamstrings, plantar and dorsiflexion, sensation to light touch intact in bilateral UE and LE     Assessment/Plan: Please see individual problem list.  Problem List Items Addressed This Visit     Balance problem    This could be related to what ever is causing the patient's hearing issue or could just be related to her orthostasis.  Neurologically she seems to be generally intact.  She will see ENT.      Hearing difficulty of both ears - Primary    This is an ongoing issue that seems to have worsened recently.  Discussed seeing ENT.  Discussed potential need for an MRI to evaluate for a cause though I will defer that to ENT.   Referral placed.      Relevant Orders   Ambulatory referral to ENT   Memory difficulty    This may just be related to her trouble hearing.  There may be some dementia underlying as well.  We will have her hearing evaluated first.  We will also check lab work to evaluate for other underlying causes of memory difficulty.      Relevant Orders   B12   TSH   Orthostasis    Found to be orthostatic today.  She was encouraged to increase her fluid intake.  We will check lab work to evaluate for underlying causes.  We will also refer her to cardiology to determine if she needs treatment with midodrine or some other work-up.      Relevant Orders   CBC   Comp Met (CMET)   Ambulatory referral to Cardiology     Return in about 3 months (around 12/18/2021).  This visit occurred during the SARS-CoV-2 public health emergency.  Safety protocols were in place, including screening questions prior to the visit, additional usage of staff PPE, and extensive cleaning of exam room while observing appropriate contact time as indicated for disinfecting solutions.   I have spent 32 minutes in the care of this patient regarding history taking, documentation, completion of exam, discussion of plan, placing orders.   Tommi Rumps, MD [place]

## 2021-09-19 NOTE — Assessment & Plan Note (Addendum)
This could be related to what ever is causing the patient's hearing issue or could just be related to her orthostasis.  Neurologically she seems to be generally intact.  She will see ENT.

## 2021-09-19 NOTE — Patient Instructions (Addendum)
Nice to see you. We will check lab work today. I will get you to see ENT for evaluation of your hearing loss. Recommended she does see cardiology for the drop in your blood pressure. You need to increase your water intake.

## 2021-09-19 NOTE — Assessment & Plan Note (Signed)
Found to be orthostatic today.  She was encouraged to increase her fluid intake.  We will check lab work to evaluate for underlying causes.  We will also refer her to cardiology to determine if she needs treatment with midodrine or some other work-up.

## 2021-09-19 NOTE — Assessment & Plan Note (Signed)
This may just be related to her trouble hearing.  There may be some dementia underlying as well.  We will have her hearing evaluated first.  We will also check lab work to evaluate for other underlying causes of memory difficulty.

## 2021-10-07 ENCOUNTER — Emergency Department: Payer: PPO

## 2021-10-07 ENCOUNTER — Encounter: Payer: Self-pay | Admitting: Emergency Medicine

## 2021-10-07 ENCOUNTER — Other Ambulatory Visit: Payer: Self-pay

## 2021-10-07 DIAGNOSIS — Z885 Allergy status to narcotic agent status: Secondary | ICD-10-CM

## 2021-10-07 DIAGNOSIS — Z8572 Personal history of non-Hodgkin lymphomas: Secondary | ICD-10-CM

## 2021-10-07 DIAGNOSIS — Z79899 Other long term (current) drug therapy: Secondary | ICD-10-CM

## 2021-10-07 DIAGNOSIS — N182 Chronic kidney disease, stage 2 (mild): Secondary | ICD-10-CM | POA: Diagnosis present

## 2021-10-07 DIAGNOSIS — H409 Unspecified glaucoma: Secondary | ICD-10-CM | POA: Diagnosis present

## 2021-10-07 DIAGNOSIS — Z9181 History of falling: Secondary | ICD-10-CM

## 2021-10-07 DIAGNOSIS — R627 Adult failure to thrive: Secondary | ICD-10-CM | POA: Diagnosis present

## 2021-10-07 DIAGNOSIS — I129 Hypertensive chronic kidney disease with stage 1 through stage 4 chronic kidney disease, or unspecified chronic kidney disease: Secondary | ICD-10-CM | POA: Diagnosis present

## 2021-10-07 DIAGNOSIS — R531 Weakness: Secondary | ICD-10-CM | POA: Diagnosis not present

## 2021-10-07 DIAGNOSIS — R509 Fever, unspecified: Secondary | ICD-10-CM | POA: Diagnosis not present

## 2021-10-07 DIAGNOSIS — R55 Syncope and collapse: Secondary | ICD-10-CM | POA: Diagnosis not present

## 2021-10-07 DIAGNOSIS — U071 COVID-19: Secondary | ICD-10-CM | POA: Diagnosis present

## 2021-10-07 DIAGNOSIS — F0393 Unspecified dementia, unspecified severity, with mood disturbance: Secondary | ICD-10-CM | POA: Diagnosis present

## 2021-10-07 DIAGNOSIS — R059 Cough, unspecified: Secondary | ICD-10-CM | POA: Diagnosis not present

## 2021-10-07 DIAGNOSIS — Z8551 Personal history of malignant neoplasm of bladder: Secondary | ICD-10-CM

## 2021-10-07 DIAGNOSIS — Z90710 Acquired absence of both cervix and uterus: Secondary | ICD-10-CM

## 2021-10-07 DIAGNOSIS — Z66 Do not resuscitate: Secondary | ICD-10-CM | POA: Diagnosis present

## 2021-10-07 DIAGNOSIS — Z8249 Family history of ischemic heart disease and other diseases of the circulatory system: Secondary | ICD-10-CM | POA: Diagnosis not present

## 2021-10-07 DIAGNOSIS — E860 Dehydration: Secondary | ICD-10-CM | POA: Diagnosis present

## 2021-10-07 DIAGNOSIS — R5381 Other malaise: Secondary | ICD-10-CM | POA: Diagnosis not present

## 2021-10-07 DIAGNOSIS — Z91048 Other nonmedicinal substance allergy status: Secondary | ICD-10-CM | POA: Diagnosis not present

## 2021-10-07 DIAGNOSIS — I959 Hypotension, unspecified: Secondary | ICD-10-CM | POA: Diagnosis not present

## 2021-10-07 DIAGNOSIS — Z86718 Personal history of other venous thrombosis and embolism: Secondary | ICD-10-CM

## 2021-10-07 DIAGNOSIS — Z7901 Long term (current) use of anticoagulants: Secondary | ICD-10-CM | POA: Diagnosis not present

## 2021-10-07 DIAGNOSIS — M47812 Spondylosis without myelopathy or radiculopathy, cervical region: Secondary | ICD-10-CM | POA: Diagnosis present

## 2021-10-07 DIAGNOSIS — Z86711 Personal history of pulmonary embolism: Secondary | ICD-10-CM

## 2021-10-07 LAB — BASIC METABOLIC PANEL
Anion gap: 6 (ref 5–15)
BUN: 25 mg/dL — ABNORMAL HIGH (ref 8–23)
CO2: 24 mmol/L (ref 22–32)
Calcium: 9.2 mg/dL (ref 8.9–10.3)
Chloride: 108 mmol/L (ref 98–111)
Creatinine, Ser: 1.11 mg/dL — ABNORMAL HIGH (ref 0.44–1.00)
GFR, Estimated: 45 mL/min — ABNORMAL LOW (ref >60)
Glucose, Bld: 119 mg/dL — ABNORMAL HIGH (ref 70–99)
Potassium: 4 mmol/L (ref 3.5–5.1)
Sodium: 138 mmol/L (ref 135–145)

## 2021-10-07 LAB — TROPONIN I (HIGH SENSITIVITY): Troponin I (High Sensitivity): 17 ng/L (ref <18)

## 2021-10-07 LAB — CBC
HCT: 35.2 % — ABNORMAL LOW (ref 36.0–46.0)
Hemoglobin: 11.5 g/dL — ABNORMAL LOW (ref 12.0–15.0)
MCH: 30.3 pg (ref 26.0–34.0)
MCHC: 32.7 g/dL (ref 30.0–36.0)
MCV: 92.9 fL (ref 80.0–100.0)
Platelets: 150 10*3/uL (ref 150–400)
RBC: 3.79 MIL/uL — ABNORMAL LOW (ref 3.87–5.11)
RDW: 14.2 % (ref 11.5–15.5)
WBC: 5.8 10*3/uL (ref 4.0–10.5)
nRBC: 0 % (ref 0.0–0.2)

## 2021-10-07 LAB — RESP PANEL BY RT-PCR (FLU A&B, COVID) ARPGX2
Influenza A by PCR: NEGATIVE
Influenza B by PCR: NEGATIVE
SARS Coronavirus 2 by RT PCR: POSITIVE — AB

## 2021-10-07 NOTE — ED Triage Notes (Signed)
Pt arrived via [facility] from home with reports of weakness and dehydration, family also states BP has been low, pt had 500cc fluid this morning, family states she was not able to ambulate to the bathroom. Family unable to care for her.   Pt has had a cough and drainage. Family also states other family members have had the flu recently.  Pt has had poor PO intake today as well.   Pt had 325mg  Tylenol around 6pm.

## 2021-10-07 NOTE — ED Notes (Signed)
Pt is no longer on Xarelto, pt has hx of DVT and PE.

## 2021-10-08 ENCOUNTER — Inpatient Hospital Stay
Admission: EM | Admit: 2021-10-08 | Discharge: 2021-10-09 | DRG: 178 | Disposition: A | Discharge status: Home Health | Payer: PPO | Attending: Student in an Organized Health Care Education/Training Program | Admitting: Student in an Organized Health Care Education/Training Program

## 2021-10-08 DIAGNOSIS — Z7901 Long term (current) use of anticoagulants: Secondary | ICD-10-CM | POA: Diagnosis not present

## 2021-10-08 DIAGNOSIS — U071 COVID-19: Secondary | ICD-10-CM | POA: Diagnosis present

## 2021-10-08 DIAGNOSIS — H409 Unspecified glaucoma: Secondary | ICD-10-CM | POA: Diagnosis present

## 2021-10-08 DIAGNOSIS — I129 Hypertensive chronic kidney disease with stage 1 through stage 4 chronic kidney disease, or unspecified chronic kidney disease: Secondary | ICD-10-CM | POA: Diagnosis present

## 2021-10-08 DIAGNOSIS — Z9181 History of falling: Secondary | ICD-10-CM | POA: Diagnosis not present

## 2021-10-08 DIAGNOSIS — M47812 Spondylosis without myelopathy or radiculopathy, cervical region: Secondary | ICD-10-CM | POA: Diagnosis present

## 2021-10-08 DIAGNOSIS — Z8572 Personal history of non-Hodgkin lymphomas: Secondary | ICD-10-CM | POA: Diagnosis not present

## 2021-10-08 DIAGNOSIS — Z66 Do not resuscitate: Secondary | ICD-10-CM | POA: Diagnosis present

## 2021-10-08 DIAGNOSIS — F0393 Unspecified dementia, unspecified severity, with mood disturbance: Secondary | ICD-10-CM | POA: Diagnosis present

## 2021-10-08 DIAGNOSIS — R627 Adult failure to thrive: Secondary | ICD-10-CM | POA: Diagnosis present

## 2021-10-08 DIAGNOSIS — Z8551 Personal history of malignant neoplasm of bladder: Secondary | ICD-10-CM | POA: Diagnosis not present

## 2021-10-08 DIAGNOSIS — Z90710 Acquired absence of both cervix and uterus: Secondary | ICD-10-CM | POA: Diagnosis not present

## 2021-10-08 DIAGNOSIS — Z79899 Other long term (current) drug therapy: Secondary | ICD-10-CM | POA: Diagnosis not present

## 2021-10-08 DIAGNOSIS — Z91048 Other nonmedicinal substance allergy status: Secondary | ICD-10-CM | POA: Diagnosis not present

## 2021-10-08 DIAGNOSIS — Z885 Allergy status to narcotic agent status: Secondary | ICD-10-CM | POA: Diagnosis not present

## 2021-10-08 DIAGNOSIS — N182 Chronic kidney disease, stage 2 (mild): Secondary | ICD-10-CM | POA: Diagnosis present

## 2021-10-08 DIAGNOSIS — Z86718 Personal history of other venous thrombosis and embolism: Secondary | ICD-10-CM | POA: Diagnosis not present

## 2021-10-08 DIAGNOSIS — Z86711 Personal history of pulmonary embolism: Secondary | ICD-10-CM | POA: Diagnosis not present

## 2021-10-08 DIAGNOSIS — E860 Dehydration: Secondary | ICD-10-CM | POA: Diagnosis present

## 2021-10-08 DIAGNOSIS — Z8249 Family history of ischemic heart disease and other diseases of the circulatory system: Secondary | ICD-10-CM | POA: Diagnosis not present

## 2021-10-08 LAB — TROPONIN I (HIGH SENSITIVITY): Troponin I (High Sensitivity): 13 ng/L (ref <18)

## 2021-10-08 MED ORDER — BRIMONIDINE TARTRATE 0.2 % OP SOLN
1.0000 [drp] | Freq: Two times a day (BID) | OPHTHALMIC | Status: DC
  Administered 2021-10-09: 10:00:00 1 [drp] via OPHTHALMIC
  Filled 2021-10-08 (×2): qty 5

## 2021-10-08 MED ORDER — SODIUM CHLORIDE 0.9 % IV SOLN
200.0000 mg | Freq: Once | INTRAVENOUS | Status: AC
  Administered 2021-10-08: 14:00:00 200 mg via INTRAVENOUS
  Filled 2021-10-08: qty 200

## 2021-10-08 MED ORDER — LATANOPROST 0.005 % OP SOLN
1.0000 [drp] | Freq: Every day | OPHTHALMIC | Status: DC
  Filled 2021-10-08: qty 2.5

## 2021-10-08 MED ORDER — SODIUM CHLORIDE 0.9 % IV SOLN
100.0000 mg | Freq: Every day | INTRAVENOUS | Status: DC
  Administered 2021-10-09: 10:00:00 100 mg via INTRAVENOUS
  Filled 2021-10-08 (×2): qty 20

## 2021-10-08 MED ORDER — HEPARIN SODIUM (PORCINE) 5000 UNIT/ML IJ SOLN
5000.0000 [IU] | Freq: Three times a day (TID) | INTRAMUSCULAR | Status: DC
  Filled 2021-10-08: qty 1

## 2021-10-08 MED ORDER — DORZOLAMIDE HCL-TIMOLOL MAL 2-0.5 % OP SOLN
1.0000 [drp] | Freq: Two times a day (BID) | OPHTHALMIC | Status: DC
Start: 2021-10-08 — End: 2021-10-09
  Administered 2021-10-09: 1 [drp] via OPHTHALMIC
  Filled 2021-10-08 (×2): qty 10

## 2021-10-08 NOTE — ED Notes (Signed)
Unsuccessful IV attempt x2.  Baxter Flattery RN to attempt.

## 2021-10-08 NOTE — ED Notes (Signed)
Pt to rm 18, placed on card monitor. Altha Harm, RN notified.

## 2021-10-08 NOTE — Progress Notes (Signed)
Remdesivir - Pharmacy Brief Note   O:  ALT: 16 CXR: Left posterior lung base atelectasis versus infiltrate. A small left pleural effusion is not excluded SpO2: 96% on [place]   A/P:  Remdesivir 200 mg IVPB once followed by 100 mg IVPB daily x 4 days.   Sherilyn Banker, PharmD Clinical Pharmacist  10/08/2021 10:22 AM

## 2021-10-08 NOTE — ED Provider Notes (Signed)
[facility] Emergency Department Provider Note  ____________________________________________   Event Date/Time   First MD Initiated Contact with Patient 10/08/21 [phone]     (approximate)  I have reviewed the triage vital signs and the nursing notes.   HISTORY  Chief Complaint Weakness   HPI Lisa Sosa is a 87 y.o. female with below noted past medical history who presents coming by daughter who she resides with for assessment of increased weakness mild cough and scratchy throat over the last 2 days.  Patient reportedly uses a walker typically to get around but has been unable to do so and EMS had to be called twice in the last 24 hours to help patient get off the commode or to move around the house.  History is very limited from the patient secondary to underlying dementia.  Per daughter she has not had any falls in several weeks and was taken off her Xarelto couple months ago due to fall at that time.  She otherwise has not had any fevers, vomiting, diarrhea or other clear sick symptoms per family.  No other history is immediately upon patient presentation.         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   Elevated blood pressure    Glaucoma    History of blood transfusion     Patient Active Problem List   Diagnosis Date Noted   COVID-19 virus infection 10/08/2021   Hearing difficulty of both ears 09/19/2021   Balance problem 09/19/2021   Memory difficulty 09/19/2021   Orthostasis 09/19/2021   Great toe pain, right 11/27/2020   Neck stiffness 05/03/2018   Encounter for completion of form with patient 05/03/2018   Depression, major, single episode, mild ([place]) 02/10/2017   Weight loss 02/10/2017   Right shoulder pain 02/10/2017   Cervical spondylosis without myelopathy 01/21/2017   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    Acquired trigger finger 12/01/2016   Bilateral lower extremity edema 05/13/2016   History of pulmonary embolus (PE) 02/10/2016   Diverticulitis of colon 02/10/2016   Neuropathy 11/13/2015   Prediabetes 11/13/2015   Conjunctivitis 11/13/2015   Personal history of bladder cancer 01/10/2014   Acute cystitis 11/01/2012   Calculus of kidney 11/01/2012   Anomalies of urachus, 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   Malaise and fatigue 11/01/2012   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[place]) 11/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   Hematuria, microscopic 11/01/2012   Excessive urination at night 11/01/2012   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 Authorizing Provider  brimonidine (ALPHAGAN) 0.2 % ophthalmic solution Place 1 drop into both eyes 2 (two) times daily. 02/03/16  Yes [provider]  dorzolamide-timolol (COSOPT) 22.3-6.8 MG/ML ophthalmic solution 1 drop 2 (two) times daily. 06/03/20  Yes [provider]  FeFum-FePo-FA-B Cmp-C-Zn-Mn-Cu (SE-TAN PLUS) 162-115.2-1 MG CAPS TAKE 1 CAPSULE BY MOUTH EVERY DAY 07/10/21  Yes Cammie Sickle, MD  latanoprost (XALATAN) 0.005 % ophthalmic solution Place 1 drop into both eyes at bedtime. 12/11/13  Yes [provider]  rivaroxaban (XARELTO) 10 MG TABS tablet TAKE 1 TABLET(10 MG) BY MOUTH DAILY Patient not taking: Reported on 09/19/2021 05/08/21   Cammie Sickle, MD     Allergies Iron and Codeine  Family History  Problem Relation Age of Onset   Alcoholism Other    Cancer - Lung Other        Parent   Cancer - Ovarian Mother    Prostate cancer Other        Other relative   Stroke Other        Other relative    High blood pressure Other        Other relative   Diabetes Other        Other relative    Social History Social History   Tobacco Use   Smoking status: Never   Smokeless tobacco: Never  Substance Use Topics   Alcohol use: No    Alcohol/week: 0.0 standard drinks   Drug use: No    Review of Systems  Review of Systems  Unable to perform ROS: Dementia     ____________________________________________   PHYSICAL EXAM:  VITAL SIGNS: ED Triage Vitals  Enc Vitals Group     BP 10/07/21 1940 (!) 133/59     Pulse Rate 10/07/21 1940 83     Resp 10/07/21 1940 18     Temp 10/07/21 1940 99.8 F (37.7 C)     Temp Source 10/07/21 1940 Oral     SpO2 10/07/21 1940 91 %     Weight 10/07/21 1942 108 lb (49 kg)     Height 10/07/21 1942 5\' 6"  (1.676 m)     Head Circumference --      Peak Flow --      Pain Score 10/07/21 1940 0     Pain Loc --      Pain Edu? --      Excl. in [place]? --    Vitals:   10/08/21 0159 10/08/21 0751  BP: (!) 143/61 (!) 148/61  Pulse: 77 78  Resp: 18 18  Temp: 98.4 F (36.9 C) 99.8 F (37.7 C)  SpO2: 98% 100%   Physical Exam Vitals and nursing note reviewed.  Constitutional:      General: She is not in acute distress.    Appearance: She is well-developed.  HENT:     Head: Normocephalic and atraumatic.  Eyes:     Conjunctiva/sclera: Conjunctivae normal.  Cardiovascular:     Rate and Rhythm: Normal rate and regular rhythm.     Heart sounds: No murmur heard. Pulmonary:     Effort: Pulmonary effort is normal. No respiratory distress.     Breath sounds: Normal breath sounds.  Abdominal:     Palpations: Abdomen is soft.     Tenderness: There is no abdominal tenderness.  Musculoskeletal:         General: No swelling.     Cervical back: Neck supple.  Skin:    General: Skin is warm and dry.     Capillary Refill: Capillary refill takes less than 2 seconds.  Neurological:     Mental Status: She is alert. Mental status is at baseline. She is disoriented and confused.  Psychiatric:        Mood and  Affect: Mood normal.    Patient able to follow commands in all extremities.  Cranial nerves II to XII are grossly intact.  Abdomen is soft.  Lungs are clear bilaterally. ____________________________________________   LABS (all labs ordered are listed, but only abnormal results are displayed)  Labs Reviewed  RESP PANEL BY RT-PCR (FLU A&B, COVID) ARPGX2 - Abnormal; Notable for the following components:      Result Value   SARS Coronavirus 2 by RT PCR POSITIVE (*)    All other components within normal limits  BASIC METABOLIC PANEL - Abnormal; Notable for the following components:   Glucose, Bld 119 (*)    BUN 25 (*)    Creatinine, Ser 1.11 (*)    GFR, Estimated 45 (*)    All other components within normal limits  CBC - Abnormal; Notable for the following components:   RBC 3.79 (*)    Hemoglobin 11.5 (*)    HCT 35.2 (*)    All other components within normal limits  URINALYSIS, ROUTINE W REFLEX MICROSCOPIC  URINALYSIS, COMPLETE (UACMP) WITH MICROSCOPIC  TROPONIN I (HIGH SENSITIVITY)  TROPONIN I (HIGH SENSITIVITY)   ____________________________________________  EKG    ECG shows sinus rhythm with a ventricular rate of 81, normal axis, unremarkable intervals without evidence of acute ischemia or significant arrhythmia ____________________________________________  RADIOLOGY  ED MD interpretation: Chest x-ray shows left posterior lung atelectasis versus early infiltrate.  No other clear focal consolidations, overt edema, large effusion, pneumothorax or other acute thoracic process.   Official radiology report(s): DG Chest 2 View  Result Date: 10/07/2021 CLINICAL DATA:  Fever  and cough. EXAM: CHEST - 2 VIEW COMPARISON:  None. FINDINGS: Apparent density along the posterior left costophrenic angle may represent atelectasis. A small pleural effusion is not excluded. Evaluation is limited as this area is only partially visualized on the lateral view. Probable area of scarring in the right upper lobe. No pneumothorax with the cardiac silhouette is within limits. Atherosclerotic calcification of the aorta. Degenerative changes of the spine and scoliosis. No acute osseous pathology. IMPRESSION: Left posterior lung base atelectasis versus infiltrate. A small left pleural effusion is not excluded. Electronically Signed   By: Anner Crete M.D.   On: 10/07/2021 20:02    ____________________________________________   PROCEDURES  Procedure(s) performed (including Critical Care):  Procedures   ____________________________________________   INITIAL IMPRESSION / ASSESSMENT AND PLAN / ED COURSE      Patient presents with above-stated history exam for assessment of increased weakness cough and scratchy throat over the last 2 days.  On arrival patient is afebrile and hemodynamically stable.  She is at her neurological baseline with nonfocal exam.  Low suspicion for acute preceding trauma or CVA.  She does not seem septic.  Additional differential considerations include some weakness from acute infectious process, arrhythmia, anemia, metabolic derangements and ACS.  ECG shows sinus rhythm with a ventricular rate of 81, normal axis, unremarkable intervals without evidence of acute ischemia or significant arrhythmia.  Given troponin nonelevated x2 I have low suspicion for ACS or myocarditis.  Chest x-ray shows left posterior lung atelectasis versus early infiltrate.  No other clear focal consolidations, overt edema, large effusion, pneumothorax or other acute thoracic process.   BMP shows no significant electrolyte or metabolic derangements.  CBC shows no leukocytosis or  significant acute anemia.  Patient is COVID-positive and influenza negative.  I suspect this is likely the primary etiology of patient's cough and weakness.  We will also order a UA.  Patient is not hypoxic and does not appear septic given she is too weak to ambulate with her walker and EMS has been called twice in the last 24 hours to assist with patient I think admission is reasonable for PT OT if possible rehab pending patient is able to regain her strength.  I will admit to medicine service for further evaluation and management.       ____________________________________________   FINAL CLINICAL IMPRESSION(S) / ED DIAGNOSES  Final diagnoses:  COVID    Medications  remdesivir 200 mg in sodium chloride 0.9% 250 mL IVPB (has no administration in time range)    Followed by  remdesivir 100 mg in sodium chloride 0.9 % 100 mL IVPB (has no administration in time range)     ED Discharge Orders     None        Note:  This document was prepared using Dragon voice recognition software and may include unintentional dictation errors.    Lucrezia Starch, MD 10/08/21 [phone]

## 2021-10-08 NOTE — H&P (Signed)
History and Physical    Lisa Sosa YFV:[id number] DOB: 07-01-1923 DOA: 10/08/2021  PCP: Lisa Haven, MD   Chief Complaint: Weakness  HPI: Lisa Sosa is a 87 y.o. 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 intake dehydration and hypotension at home with multiple episodes of near fall but no episodes of syncope vertigo or altered mental status from baseline.  In the ED patient had IV fluids, chest x-ray shows questionable small left pleural effusion, micro unfortunately positive for COVID with the presumption of COVID-19 pneumonia given abnormal chest x-ray findings but is without hypoxia.  Patient was subsequently given Remdesivir x1 in the ED and hospitalist was called for admission.  Review of Systems: As per HPI weakness poor p.o. intake and dehydration denies nausea vomiting diarrhea constipation headache fevers chills or chest pain.   Assessment/Plan  COVID-19 infection Without hypoxia Continue Remdesivir x 5 days - discussed 3 day course with daughter who is agreeable if patient symptoms improve in that timeframe. Patient does not qualify for steroids or Actemra at this time given mild symptoms  Acute ambulatory dysfunction and weakness  In the setting of above Uses a walker at baseline PT OT to follow, appreciate insight and recommendations Currently off anticoagulation per PCP due to increased fall risk  Poor p.o. intake, failure to thrive and dehydration  Secondary to above  History of PE/DVT-Xarelto discontinued for fall risk as above Hypertension-currently well controlled off medications at home [place] currently on therapy per med rec CKD 2-stable, follow repeat labs Baseline dementia-currently at baseline per daughter  DVT prophylaxis: Heparin subcu Code Status: DNR Family Communication: Daughter updated  on phone   Status is: Inpt  Dispo: The patient is from: Home              Anticipated d/c is to: TBD              Anticipated d/c date is: 48-72h              Patient currently mot medically stable for discharge  Consultants:  None  Procedures:  None  Past Medical History:  Diagnosis Date   Cancer ([place]) 2011   gallbladder   Chickenpox    Diffuse large B cell lymphoma ([place]) 03/17/2015   Elevated blood pressure    Glaucoma    History of blood transfusion    Past Surgical History:  Procedure Laterality Date   ABDOMINAL HYSTERECTOMY  1978   For fibroids   BREAST BIOPSY Right [place] TUMOR  2011     reports that she has never smoked. She has never used smokeless tobacco. She reports that she does not drink alcohol and does not use drugs.  Allergies  Allergen Reactions   Iron Nausea And Vomiting   Codeine Nausea Only   Family History  Problem Relation Age of Onset   Alcoholism Other    Cancer - Lung Other        Parent   Cancer - Ovarian Mother    Prostate cancer Other        Other relative   Stroke Other        Other relative    High blood pressure Other        Other relative   Diabetes Other        Other relative   Prior to Admission  medications   Medication Sig Start Date End Date Taking? Authorizing Provider  brimonidine (ALPHAGAN) 0.2 % ophthalmic solution Place 1 drop into both eyes 2 (two) times daily. 02/03/16  Yes [provider]  dorzolamide-timolol (COSOPT) 22.3-6.8 MG/ML ophthalmic solution 1 drop 2 (two) times daily. 06/03/20  Yes [provider]  FeFum-FePo-FA-B Cmp-C-Zn-Mn-Cu (SE-TAN PLUS) 162-115.2-1 MG CAPS TAKE 1 CAPSULE BY MOUTH EVERY DAY 07/10/21  Yes Cammie Sickle, MD  latanoprost (XALATAN) 0.005 % ophthalmic solution Place 1 drop into both eyes at bedtime. 12/11/13  Yes [provider]  rivaroxaban (XARELTO) 10 MG TABS tablet TAKE 1 TABLET(10 MG) BY MOUTH DAILY Patient not  taking: Reported on 09/19/2021 05/08/21   Cammie Sickle, MD   Physical Exam: Vitals:   10/07/21 1942 10/07/21 1946 10/08/21 0159 10/08/21 0751  BP:   (!) 143/61 (!) 148/61  Pulse:   77 78  Resp:   18 18  Temp:   98.4 F (36.9 C) 99.8 F (37.7 C)  TempSrc:   Oral Oral  SpO2:  92% 98% 100%  Weight: 49 kg     Height: 5\' 6"  (1.676 m)      Constitutional: NAD, calm, comfortable Vitals:   10/07/21 1942 10/07/21 1946 10/08/21 0159 10/08/21 0751  BP:   (!) 143/61 (!) 148/61  Pulse:   77 78  Resp:   18 18  Temp:   98.4 F (36.9 C) 99.8 F (37.7 C)  TempSrc:   Oral Oral  SpO2:  92% 98% 100%  Weight: 49 kg     Height: 5\' 6"  (1.676 m)      General:  Pleasantly resting in bed, No acute distress. HEENT:  Normocephalic atraumatic.  Sclerae nonicteric, noninjected.  Extraocular movements intact bilaterally. Neck:  Without mass or deformity.  Trachea is midline. Lungs:  Clear to auscultate bilaterally without rhonchi, wheeze, or rales. Heart:  Regular rate and rhythm.  Without murmurs, rubs, or gallops. Abdomen:  Soft, nontender, nondistended.  Without guarding or rebound. Extremities: Without cyanosis, clubbing, edema, or obvious deformity. Vascular:  Dorsalis pedis and posterior tibial pulses palpable bilaterally. Skin:  Warm and dry, no erythema, no ulcerations.  Labs on Admission: I have personally reviewed following labs and imaging studies  CBC: Recent Labs  Lab 10/07/21 1952  WBC 5.8  HGB 11.5*  HCT 35.2*  MCV 92.9  PLT 960   Basic Metabolic Panel: Recent Labs  Lab 10/07/21 1952  NA 138  K 4.0  CL 108  CO2 24  GLUCOSE 119*  BUN 25*  CREATININE 1.11*  CALCIUM 9.2   GFR: Estimated Creatinine Clearance: 22.9 mL/min (A) (by C-G formula based on SCr of 1.11 mg/dL (H)). Liver Function Tests: No results for input(s): AST, ALT, ALKPHOS, BILITOT, PROT, ALBUMIN in the last 168 hours. No results for input(s): LIPASE, AMYLASE in the last 168 hours. No results for  input(s): AMMONIA in the last 168 hours. Coagulation Profile: No results for input(s): INR, PROTIME in the last 168 hours. Cardiac Enzymes: No results for input(s): CKTOTAL, CKMB, CKMBINDEX, TROPONINI in the last 168 hours. BNP (last 3 results) No results for input(s): PROBNP in the last 8760 hours. HbA1C: No results for input(s): HGBA1C in the last 72 hours. CBG: No results for input(s): GLUCAP in the last 168 hours. Lipid Profile: No results for input(s): CHOL, HDL, LDLCALC, TRIG, CHOLHDL, LDLDIRECT in the last 72 hours. Thyroid Function Tests: No results for input(s): TSH, T4TOTAL, FREET4, T3FREE, THYROIDAB in the last 72 hours. Anemia  Panel: No results for input(s): VITAMINB12, FOLATE, FERRITIN, TIBC, IRON, RETICCTPCT in the last 72 hours. Urine analysis:    Component Value Date/Time   COLORURINE AMBER (A) 01/02/2016 1320   APPEARANCEUR CLOUDY (A) 01/02/2016 1320   LABSPEC 1.023 01/02/2016 1320   PHURINE 5.0 01/02/2016 1320   GLUCOSEU NEGATIVE 01/02/2016 1320   HGBUR 1+ (A) 01/02/2016 1320   BILIRUBINUR NEGATIVE 01/02/2016 1320   KETONESUR TRACE (A) 01/02/2016 1320   PROTEINUR 30 (A) 01/02/2016 1320   NITRITE NEGATIVE 01/02/2016 1320   LEUKOCYTESUR NEGATIVE 01/02/2016 1320   Radiological Exams on Admission: DG Chest 2 View  Result Date: 10/07/2021 CLINICAL DATA:  Fever and cough. EXAM: CHEST - 2 VIEW COMPARISON:  None. FINDINGS: Apparent density along the posterior left costophrenic angle may represent atelectasis. A small pleural effusion is not excluded. Evaluation is limited as this area is only partially visualized on the lateral view. Probable area of scarring in the right upper lobe. No pneumothorax with the cardiac silhouette is within limits. Atherosclerotic calcification of the aorta. Degenerative changes of the spine and scoliosis. No acute osseous pathology. IMPRESSION: Left posterior lung base atelectasis versus infiltrate. A small left pleural effusion is not  excluded. Electronically Signed   By: Anner Crete M.D.   On: 10/07/2021 20:02    EKG: Independently reviewed. NSR  Little Ishikawa DO [facility] For contact please use secure messenger on Epic  If 7PM-7AM, please contact night-coverage located on [email or web address]  10/08/2021, 10:27 AM

## 2021-10-09 DIAGNOSIS — U071 COVID-19: Secondary | ICD-10-CM | POA: Diagnosis not present

## 2021-10-09 LAB — BASIC METABOLIC PANEL
Anion gap: 7 (ref 5–15)
BUN: 25 mg/dL — ABNORMAL HIGH (ref 8–23)
CO2: 23 mmol/L (ref 22–32)
Calcium: 8.8 mg/dL — ABNORMAL LOW (ref 8.9–10.3)
Chloride: 106 mmol/L (ref 98–111)
Creatinine, Ser: 1.02 mg/dL — ABNORMAL HIGH (ref 0.44–1.00)
GFR, Estimated: 50 mL/min — ABNORMAL LOW (ref >60)
Glucose, Bld: 90 mg/dL (ref 70–99)
Potassium: 3.8 mmol/L (ref 3.5–5.1)
Sodium: 136 mmol/L (ref 135–145)

## 2021-10-09 LAB — CBC
HCT: 36.5 % (ref 36.0–46.0)
Hemoglobin: 12.1 g/dL (ref 12.0–15.0)
MCH: 30.1 pg (ref 26.0–34.0)
MCHC: 33.2 g/dL (ref 30.0–36.0)
MCV: 90.8 fL (ref 80.0–100.0)
Platelets: 142 10*3/uL — ABNORMAL LOW (ref 150–400)
RBC: 4.02 MIL/uL (ref 3.87–5.11)
RDW: 14 % (ref 11.5–15.5)
WBC: 5 10*3/uL (ref 4.0–10.5)
nRBC: 0 % (ref 0.0–0.2)

## 2021-10-09 MED ORDER — PAXLOVID (300/100) 20 X 150 MG & 10 X 100MG PO TBPK: ORAL_TABLET | ORAL | 0 refills | Status: AC

## 2021-10-09 NOTE — Evaluation (Signed)
Physical Therapy Evaluation Patient Details Name: Lisa Sosa MRN: [id number] DOB: 1923/04/05 Today's Date: 10/09/2021  History of Present Illness  Pt is a  87 y.o. 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 intake dehydration and hypotension at home with multiple episodes of near fall but no episodes of syncope vertigo or altered mental status from baseline. CV-19+.   Clinical Impression  Patient easily wakes to voice, oriented to self, and place, provided some PLOF accurately, checked with her daughter. At baseline pt is modI with her rollator, performs some ADLs independently, requiring a bit more supervision lately. Daughter performs cooking, cleaning, errands, etc. Her daughter reported at least 4 falls in the last 6 months.  The patient was able to roll in bed independently. Sidelying to sit with very light minA due to left lateral lean. Sit <> stand with CGA and RW, pt does rely on pulling on walker to complete transfer. She was able to ambulate ~51ft with RW and CGA, cues to redirect as needed to complete task. Set up for breakfast, and chair lap alarm place, pt verbalized understanding.  Overall the patient demonstrated deficits (see "PT Problem List") that impede the patient's functional abilities, safety, and mobility and would benefit from skilled PT intervention. Recommendation is HHPT with frequent/constant supervision for safety.       Recommendations for follow up therapy are one component of a multi-disciplinary discharge planning process, led by the attending physician.  Recommendations may be updated based on patient status, additional functional criteria and insurance authorization.  Follow Up Recommendations Home health PT    Assistance Recommended at Discharge Frequent or constant Supervision/Assistance  Functional Status  Assessment Patient has had a recent decline in their functional status and demonstrates the ability to make significant improvements in function in a reasonable and predictable amount of time.  Equipment Recommendations  None recommended by PT    Recommendations for Other Services       Precautions / Restrictions Precautions Precautions: Fall Restrictions Weight Bearing Restrictions: No      Mobility  Bed Mobility Overal bed mobility: Needs Assistance Bed Mobility: Rolling;Sidelying to Sit Rolling: Supervision Sidelying to sit: Min assist       General bed mobility comments: very light minA for trunk elevation    Transfers Overall transfer level: Needs assistance Equipment used: Rolling walker (2 wheels) Transfers: Sit to/from Stand;Bed to chair/wheelchair/BSC Sit to Stand: Min guard   Step pivot transfers: Min guard       General transfer comment: pt does pull on RW to complete transfusion    Ambulation/Gait   Gait Distance (Feet): 40 Feet Assistive device: Rolling walker (2 wheels)         General Gait Details: cues to attend to task, no physical assist needed  Stairs            Wheelchair Mobility    Modified Rankin (Stroke Patients Only)       Balance Overall balance assessment: Needs assistance Sitting-balance support: Feet supported Sitting balance-Leahy Scale: Good     Standing balance support: Reliant on assistive device for balance Standing balance-Leahy Scale: Fair                               Pertinent Vitals/Pain Pain Assessment: Faces Faces Pain Scale: No hurt    Home Living Family/patient  expects to be discharged to:: Private residence Living Arrangements: Children Available Help at Discharge: Family;Available 24 hours/day Type of Home: House Home Access: Stairs to enter Entrance Stairs-Rails: None Entrance Stairs-Number of Steps: 2   Home Layout: One level Home Equipment: Rollator (4  wheels) Additional Comments: 4 falls in the last 6 months. handheld assist for stair navigation at baseline    Prior Function Prior Level of Function : Needs assist  Cognitive Assist : Mobility (cognitive);ADLs (cognitive) Mobility (Cognitive): Set up cues ADLs (Cognitive): Intermittent cues       Mobility Comments: modI with rollator ADLs Comments: able to dress and perform sink bath predominantly independent per pt and family. does not perform cooking, cleaning, IADLs     Hand Dominance        Extremity/Trunk Assessment   Upper Extremity Assessment Upper Extremity Assessment: Difficult to assess due to impaired cognition    Lower Extremity Assessment Lower Extremity Assessment: Difficult to assess due to impaired cognition    Cervical / Trunk Assessment Cervical / Trunk Assessment: Normal  Communication   Communication: No difficulties  Cognition Arousal/Alertness: Awake/alert Behavior During Therapy: WFL for tasks assessed/performed Overall Cognitive Status: History of cognitive impairments - at baseline                                 General Comments: oriented to self and place. some PLOF information accurate        General Comments      Exercises     Assessment/Plan    PT Assessment Patient needs continued PT services  PT Problem List Decreased activity tolerance;Decreased balance;Decreased mobility       PT Treatment Interventions DME instruction;Therapeutic exercise;Gait training;Balance training;Stair training;Neuromuscular re-education;Functional mobility training;Therapeutic activities;Patient/family education    PT Goals (Current goals can be found in the Care Plan section)  Acute Rehab PT Goals Patient Stated Goal: to return to [facility], be safe at home PT Goal Formulation: With family Time For Goal Achievement: 10/23/21 Potential to Achieve Goals: Good    Frequency Min 2X/week   Barriers to discharge        Co-evaluation                AM-PAC PT "6 Clicks" Mobility  Outcome Measure Help needed turning from your back to your side while in a flat bed without using bedrails?: None Help needed moving from lying on your back to sitting on the side of a flat bed without using bedrails?: None Help needed moving to and from a bed to a chair (including a wheelchair)?: None Help needed standing up from a chair using your arms (e.g., wheelchair or bedside chair)?: A Little Help needed to walk in hospital room?: A Little Help needed climbing 3-5 steps with a railing? : A Little 6 Click Score: 21    End of Session Equipment Utilized During Treatment: Gait belt Activity Tolerance: Patient tolerated treatment well Patient left: in chair;with chair alarm set;with call bell/phone within reach (chair lap alarm in place, pt verbalized understanding) Nurse Communication: Mobility status PT Visit Diagnosis: Other abnormalities of gait and mobility (R26.89);Difficulty in walking, not elsewhere classified (R26.2);Muscle weakness (generalized) (M62.81)    Time: 5852-7782 PT Time Calculation (min) (ACUTE ONLY): 28 min   Charges:   PT Evaluation $PT Eval Low Complexity: 1 Low PT Treatments $Therapeutic Exercise: 8-22 mins $Therapeutic Activity: 8-22 mins       Lieutenant Diego PT, DPT 11:04  AM,10/09/21

## 2021-10-09 NOTE — Discharge Instructions (Signed)
Please complete 3 days of the paxlovid treatment starting tomorrow (Friday, 12/30).  Return to hospital if having difficulty with breathing not improved with rest.

## 2021-10-09 NOTE — TOC Progression Note (Signed)
Transition of Care [facility]) - Progression Note    Patient Details  Name: Lisa Sosa MRN: [id number] Date of Birth: [birth year]  [facility]) CM/SW [place], RN Phone Number: 10/09/2021, 1:42 PM  Clinical Narrative:   Patient discharged prior to Care Management assessment and Home Health setup.  Director of floor states patient needs discharge and resoures to be set up at a later time.  Family contacted RNCM stating patient was discharged with fever and family is upset care team aware.  Home Health notified         Expected Discharge Plan and Services           Expected Discharge Date: 10/09/21                                     Social Determinants of Health (SDOH) Interventions    Readmission Risk Interventions No flowsheet data found.

## 2021-10-09 NOTE — Discharge Summary (Signed)
Physician Discharge Summary  Lisa Sosa OFB:[id number] DOB: March 18, 1923 DOA: 10/08/2021  PCP: [name], MD  Admit date: 10/08/2021 Discharge date: 10/09/2021  Admitted From: Home Disposition: Home, Relative's home  Recommendations for Outpatient Follow-up:  Follow up with PCP within 1-2 weeks   Physical therapy Equipment/Devices:none  Discharge Condition:stable, improved CODE STATUS:  Code Status: Prior  Regular healthy diet  Brief/Interim Summary: Pt presented to hospital from home due to concern for weakness and poor PO intake. On presentation, she was found to be covid-19 positive. She was started on remdesivir and observed overnight. PT evaluated her and she was able to ambulate in room independently without hypoxia ORA. She was discharged home in stable condition.  Discharge Diagnoses:  Principal Problem:   COVID-19 virus infection   Allergies as of 10/09/2021       Reactions   Iron Nausea And Vomiting   Codeine Nausea Only        Medication List     TAKE these medications    brimonidine 0.2 % ophthalmic solution Commonly known as: ALPHAGAN Place 1 drop into both eyes 2 (two) times daily.   dorzolamide-timolol 22.3-6.8 MG/ML ophthalmic solution Commonly known as: COSOPT 1 drop 2 (two) times daily.   latanoprost 0.005 % ophthalmic solution Commonly known as: XALATAN Place 1 drop into both eyes at bedtime.   Paxlovid (300/100) 20 x 150 MG & 10 x 100MG  Tbpk Generic drug: nirmatrelvir & ritonavir Take twice per day as directed by instructions for a total of 3 days. Begin the dose on morning after hospital stay. Start taking on: October 10, 2021   Se-Tan PLUS 162-115.2-1 MG Caps TAKE 1 CAPSULE BY MOUTH EVERY DAY        Allergies  Allergen Reactions   Iron Nausea And Vomiting   Codeine Nausea Only    Consultations: none  Procedures/Studies: DG Chest 2 View  Result Date: 10/07/2021 CLINICAL DATA:  Fever and cough. EXAM: CHEST -  2 VIEW COMPARISON:  None. FINDINGS: Apparent density along the posterior left costophrenic angle may represent atelectasis. A small pleural effusion is not excluded. Evaluation is limited as this area is only partially visualized on the lateral view. Probable area of scarring in the right upper lobe. No pneumothorax with the cardiac silhouette is within limits. Atherosclerotic calcification of the aorta. Degenerative changes of the spine and scoliosis. No acute osseous pathology. IMPRESSION: Left posterior lung base atelectasis versus infiltrate. A small left pleural effusion is not excluded. Electronically Signed   By: Anner Crete M.D.   On: 10/07/2021 20:02    Subjective: Patient denies SOB, CP, cough. She can tolerate PO. She is happy to go home today.  Discharge Exam: Vitals:   10/09/21 0435 10/09/21 0757  BP: (!) 121/50 (!) 137/57  Pulse: 91 95  Resp: 18 17  Temp: 99.1 F (37.3 C) 98.7 F (37.1 C)  SpO2: 99% 99%    General: Pt is alert, awake, not in acute distress Cardiovascular: RRR, S1/S2 +, no rubs, no gallops Respiratory: CTA bilaterally, no wheezing, no rhonchi Abdominal: Soft, NT, ND, bowel sounds + Extremities: no edema, no cyanosis  Labs: Basic Metabolic Panel: Recent Labs  Lab 10/07/21 1952 10/09/21 0457  NA 138 136  K 4.0 3.8  CL 108 106  CO2 24 23  GLUCOSE 119* 90  BUN 25* 25*  CREATININE 1.11* 1.02*  CALCIUM 9.2 8.8*   CBC: Recent Labs  Lab 10/07/21 1952 10/09/21 0457  WBC 5.8 5.0  HGB 11.5*  12.1  HCT 35.2* 36.5  MCV 92.9 90.8  PLT 150 142*    Microbiology Recent Results (from the past 240 hour(s))  Resp Panel by RT-PCR (Flu A&B, Covid) Nasopharyngeal Swab     Status: Abnormal   Collection Time: 10/07/21  7:52 PM   Specimen: Nasopharyngeal Swab; Nasopharyngeal(NP) swabs in vial transport medium  Result Value Ref Range Status   SARS Coronavirus 2 by RT PCR POSITIVE (A) NEGATIVE Final    Comment: (NOTE) SARS-CoV-2 target nucleic acids are  DETECTED.  The SARS-CoV-2 RNA is generally detectable in upper respiratory specimens during the acute phase of infection. Positive results are indicative of the presence of the identified virus, but do not rule out bacterial infection or co-infection with other pathogens not detected by the test. Clinical correlation with patient history and other diagnostic information is necessary to determine patient infection status. The expected result is Negative.  Fact Sheet for Patients: EntrepreneurPulse.com.au  Fact Sheet for Healthcare Providers: IncredibleEmployment.be  This test is not yet approved or cleared by the [place] FDA and  has been authorized for detection and/or diagnosis of SARS-CoV-2 by FDA under an Emergency Use Authorization (EUA).  This EUA will remain in effect (meaning this test can be used) for the duration of  the COVID-19 declaration under Section 564(b)(1) of the A ct, 21 U.S.C. section 360bbb-3(b)(1), unless the authorization is terminated or revoked sooner.     Influenza A by PCR NEGATIVE NEGATIVE Final   Influenza B by PCR NEGATIVE NEGATIVE Final    Comment: (NOTE) The Xpert Xpress SARS-CoV-2/FLU/RSV plus assay is intended as an aid in the diagnosis of influenza from Nasopharyngeal swab specimens and should not be used as a sole basis for treatment. Nasal washings and aspirates are unacceptable for Xpert Xpress SARS-CoV-2/FLU/RSV testing.  Fact Sheet for Patients: EntrepreneurPulse.com.au  Fact Sheet for Healthcare Providers: IncredibleEmployment.be  This test is not yet approved or cleared by the [place] FDA and has been authorized for detection and/or diagnosis of SARS-CoV-2 by FDA under an Emergency Use Authorization (EUA). This EUA will remain in effect (meaning this test can be used) for the duration of the COVID-19 declaration under Section 564(b)(1) of the Act, 21  U.S.C. section 360bbb-3(b)(1), unless the authorization is terminated or revoked.  Performed at [facility], [address], [place], [place] [address]     Time coordinating discharge: Over 30 minutes  Richarda Osmond, MD  [facility] 10/09/2021, 8:39 PM

## 2021-10-09 NOTE — Progress Notes (Signed)
Spoke with patient's daughter Delcie Roch who will be here to pick her up for discharge around 1pm.

## 2021-10-09 NOTE — Evaluation (Signed)
Occupational Therapy Evaluation Patient Details Name: Lisa Sosa MRN: [id number] DOB: 10-01-1923 Today's Date: 10/09/2021   History of Present Illness Pt is a  87 y.o. 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 intake dehydration and hypotension at home with multiple episodes of near fall but no episodes of syncope vertigo or altered mental status from baseline. CV-19+.   Clinical Impression   Pt was seen for OT evaluation this date. Prior to hospital admission, pt was mod indep for sink bathing and dressing, daughter assisting with IADL. Currently pt demonstrates impairments as described below (See OT problem list) which functionally limit *his/her ability to perform ADL/self-care tasks. Pt currently requires set up/supv PRN MIN A for LB ADL, CGA for ADL transfers, PRN VC for safety/sequencing. Pt would benefit from skilled OT services to address noted impairments and functional limitations (see below for any additional details) in order to maximize safety and independence while minimizing falls risk and caregiver burden. Upon hospital discharge, recommend HHOT to maximize pt safety and return to functional independence during meaningful occupations of daily life.   Recommendations for follow up therapy are one component of a multi-disciplinary discharge planning process, led by the attending physician.  Recommendations may be updated based on patient status, additional functional criteria and insurance authorization.   Follow Up Recommendations  Home health OT    Assistance Recommended at Discharge Frequent or constant Supervision/Assistance  Functional Status Assessment  Patient has had a recent decline in their functional status and demonstrates the ability to make significant improvements in function in a reasonable and predictable amount of  time.  Equipment Recommendations  BSC/3in1    Recommendations for Other Services       Precautions / Restrictions Precautions Precautions: Fall Restrictions Weight Bearing Restrictions: No      Mobility Bed Mobility        General bed mobility comments: NT, up in recliner at start and end of session    Transfers Overall transfer level: Needs assistance Equipment used: Rolling walker (2 wheels) Transfers: Sit to/from Stand Sit to Stand: Min guard     Step pivot transfers: Min guard     General transfer comment: pt does pull on RW to complete transfusion      Balance Overall balance assessment: Needs assistance Sitting-balance support: Feet supported Sitting balance-Leahy Scale: Good     Standing balance support: Reliant on assistive device for balance Standing balance-Leahy Scale: Fair                             ADL either performed or assessed with clinical judgement   ADL                                         General ADL Comments: Pt requires PRN MIN A for LB ADL, generally set up/supv otherwise, CGA for ADL transfers, PRN VC for safety/sequencing     Vision         Perception     Praxis      Pertinent Vitals/Pain Pain Assessment: Faces Faces Pain Scale: No hurt     Hand Dominance     Extremity/Trunk Assessment Upper Extremity Assessment Upper Extremity Assessment: Difficult to assess due to impaired cognition;Overall [facility] for tasks  assessed   Lower Extremity Assessment Lower Extremity Assessment: Overall WFL for tasks assessed;Difficult to assess due to impaired cognition   Cervical / Trunk Assessment Cervical / Trunk Assessment: Normal   Communication Communication Communication: No difficulties   Cognition Arousal/Alertness: Awake/alert Behavior During Therapy: WFL for tasks assessed/performed Overall Cognitive Status: History of cognitive impairments - at baseline                                  General Comments: oriented to self and place. some PLOF information accurate     General Comments       Exercises     Shoulder Instructions      Home Living Family/patient expects to be discharged to:: Private residence Living Arrangements: Children Available Help at Discharge: Family;Available 24 hours/day Type of Home: House Home Access: Stairs to enter [employer] of Steps: 2 Entrance Stairs-Rails: None Home Layout: One level     Bathroom Shower/Tub: Teacher, early years/pre: Standard     Home Equipment: Rollator (4 wheels)   Additional Comments: 4 falls in the last 6 months. handheld assist for stair navigation at baseline      Prior Functioning/Environment Prior Level of Function : Needs assist  Cognitive Assist : Mobility (cognitive);ADLs (cognitive) Mobility (Cognitive): Set up cues ADLs (Cognitive): Intermittent cues       Mobility Comments: modI with rollator ADLs Comments: able to dress and perform sink bath predominantly independent per pt and family. does not perform cooking, cleaning, IADLs        OT Problem List: Impaired balance (sitting and/or standing);Decreased knowledge of use of DME or AE;Decreased safety awareness      OT Treatment/Interventions: Self-care/ADL training;Therapeutic exercise;Therapeutic activities;DME and/or AE instruction;Patient/family education;Balance training;Cognitive remediation/compensation    OT Goals(Current goals can be found in the care plan section) Acute Rehab OT Goals Patient Stated Goal: pt does not state OT Goal Formulation: Patient unable to participate in goal setting Time For Goal Achievement: 10/23/21 Potential to Achieve Goals: Good ADL Goals Pt Will Perform Lower Body Dressing: with supervision;sit to/from stand Additional ADL Goal #1: Pt will perform morning ADL routine with supervision assist, PRN VC for safety, 3/3 opportunities.  OT Frequency: Min 2X/week    Barriers to D/C:            Co-evaluation              AM-PAC OT "6 Clicks" Daily Activity     Outcome Measure Help from another person eating meals?: None Help from another person taking care of personal grooming?: A Little Help from another person toileting, which includes using toliet, bedpan, or urinal?: A Little Help from another person bathing (including washing, rinsing, drying)?: A Little Help from another person to put on and taking off regular upper body clothing?: A Little Help from another person to put on and taking off regular lower body clothing?: A Little 6 Click Score: 19   End of Session Nurse Communication: Mobility status  Activity Tolerance: Patient tolerated treatment well Patient left: in chair;with call bell/phone within reach;with chair alarm set  OT Visit Diagnosis: Other abnormalities of gait and mobility (R26.89)                Time: 9798-9211 OT Time Calculation (min): 14 min Charges:  OT General Charges $OT Visit: 1 Visit OT Evaluation $OT Eval Low Complexity: 1 Low  Ardeth Perfect., MPH, MS, OTR/L ascom  [phone] 10/09/21, 11:37 AM

## 2021-10-10 ENCOUNTER — Telehealth: Payer: Self-pay

## 2021-10-10 ENCOUNTER — Other Ambulatory Visit: Payer: Self-pay | Admitting: Student in an Organized Health Care Education/Training Program

## 2021-10-10 DIAGNOSIS — U071 COVID-19: Secondary | ICD-10-CM

## 2021-10-10 NOTE — Telephone Encounter (Signed)
Transition Care Management Follow-up Telephone Call Date of discharge and from where: 10/09/21 [facility] How have you been since you were released from the hospital? Condition not worsening. Resting well. Good appetite. Input/output appropriate. [place] twice today due to weakness, no injury. Denies shortness of breath, pain, headache, fever and all other symptoms.  Any questions or concerns? Yes  Items Reviewed: Did the pt receive and understand the discharge instructions provided? Yes  Medications obtained and verified? Yes  Any new allergies since your discharge? No  Dietary orders reviewed? Yes. Regular. Do you have support at home? Yes . Staying with daughter Delcie Roch.  Home Care and Equipment/Supplies: Were home health services ordered? Yes. Awaiting PT to call and set up schedule.   Functional Questionnaire: (I = Independent and D = Dependent) ADLs: Daughter assist. Patient too weak to walk without assistance. Walker/cane cumbersome; not in use.   Follow up appointments reviewed:  PCP Hospital f/u appt confirmed? Yes  Scheduled to see Dutch Quint FNP, on 10/21/21 @ 1:00. Are transportation arrangements needed? No  If their condition worsens, is the pt aware to call PCP or go to the Emergency Dept.? Yes Was the patient provided with contact information for the PCP's office or ED? Yes Was to pt encouraged to call back with questions or concerns? Yes

## 2021-10-10 NOTE — Telephone Encounter (Signed)
Transition Care Management Follow-up Telephone Call  Any questions or concerns? Yes. Awaiting follow up with PT/OT/RN services. Patient daughter also requests referral for personal care assistant.

## 2021-10-13 DIAGNOSIS — U071 COVID-19: Secondary | ICD-10-CM | POA: Diagnosis not present

## 2021-10-13 DIAGNOSIS — Z9181 History of falling: Secondary | ICD-10-CM | POA: Diagnosis not present

## 2021-10-14 ENCOUNTER — Telehealth: Payer: Self-pay | Admitting: Family Medicine

## 2021-10-14 NOTE — Telephone Encounter (Signed)
Access nurse called office with recommendations that patient return to hospital ED , patient daughter refused local ED unless provider could guarantee patient would be seen on arrival Access Nurse ask me to speak with daughter after verifying daughter on DPR and identifying patient Spoke with daughter and recommended she follow Access Nurse advice , advised I would call ED and notify patient in route ut I could not guarantee as DPR asked when patient would be seen.  Patient daughter complaint patient says she just feels terrible and weak could not give specific complaint other just feels terrible. Patient daughter did advise patient has not been eating or drinking since DC from hospital , no more than a couple of sips of coffee and 2 bites of a sandwich.  I advised daughter she could drive patient to ED and that she may have less wait time at [facility] patient daughter DPR agreed to take patient to ED at [facility] advised any difficulty breathing or lethargy she would need to call 911 and patient be transported to closest ED for evaluation. DR stated patient breathing okay speaking in full sentences just main complaint she feels terrible and not eating or drinking . Patient going by car per daughter to [facility] ED.

## 2021-10-14 NOTE — Telephone Encounter (Signed)
Pt daughter called stating that pt was released from hospital on Thursday because she had covid. Pt was given flaxoid. Pt daughter said she had one pill left and she stopped her from taking it because she had liquid diarrhea. Pt daughter stated that her eating has decreased. She cant walk around and but to the bedside toilet. Pt does not have a fever and no symptoms. Pt daughter said she is shaky and weak since since she got out of hospital. Pt daughter stated she cannot stand and does not want to sit up in the chair. Sent to access nurse

## 2021-10-15 NOTE — Telephone Encounter (Signed)
See triage notes Agree with advice to be seen in ED  I dont see she was seen in ED Please confirm that went

## 2021-10-15 NOTE — Telephone Encounter (Signed)
Noted Glad to hear improvement of weakness, hydration

## 2021-10-15 NOTE — Telephone Encounter (Signed)
FYI I spoke with Lisa Sosa patient's daughter & she stated that she did not take her mom to ED yesterday. She said that she went & got patient some pedialyte. She drank that & was able to eat some lunch yesterday as well. She has continued to drink & patient's daughter was able to get her to sit up. She stated that she has perked up & feels that ED right now isn't necessary. She feels that she may have just been dehydrated & needed some fluids. She felt the effects of the anti-viral were almost as bad as the virus itself. She was advised that patient needed to go back to ED if worsening symptoms or no improvement. She is scheduled with Padonda 10/21/21 & hopes she will be well enough to come in person.

## 2021-10-17 ENCOUNTER — Telehealth: Payer: Self-pay | Admitting: Family Medicine

## 2021-10-17 NOTE — Telephone Encounter (Signed)
[facility] Occpational therepist called in patient refused therapy today and would schedule for next week,   Call Colletta [place] at  [phone] if need to speak

## 2021-10-21 ENCOUNTER — Inpatient Hospital Stay: Payer: PPO | Admitting: Family

## 2021-10-23 ENCOUNTER — Ambulatory Visit: Payer: PPO | Admitting: Cardiology

## 2021-10-23 DIAGNOSIS — C8590 Non-Hodgkin lymphoma, unspecified, unspecified site: Secondary | ICD-10-CM | POA: Diagnosis not present

## 2021-10-23 DIAGNOSIS — Z9181 History of falling: Secondary | ICD-10-CM | POA: Diagnosis not present

## 2021-10-23 DIAGNOSIS — U071 COVID-19: Secondary | ICD-10-CM | POA: Diagnosis not present

## 2021-10-23 DIAGNOSIS — H353290 Exudative age-related macular degeneration, unspecified eye, stage unspecified: Secondary | ICD-10-CM | POA: Diagnosis not present

## 2021-10-23 DIAGNOSIS — F0390 Unspecified dementia without behavioral disturbance: Secondary | ICD-10-CM | POA: Diagnosis not present

## 2021-10-28 ENCOUNTER — Inpatient Hospital Stay: Payer: PPO | Admitting: Family

## 2021-10-29 ENCOUNTER — Other Ambulatory Visit: Payer: Self-pay

## 2021-10-29 ENCOUNTER — Encounter: Payer: Self-pay | Admitting: Internal Medicine

## 2021-10-29 ENCOUNTER — Ambulatory Visit (INDEPENDENT_AMBULATORY_CARE_PROVIDER_SITE_OTHER): Payer: PPO | Admitting: Internal Medicine

## 2021-10-29 VITALS — BP 110/52 | HR 70 | Temp 98.2°F | Ht 66.0 in | Wt 107.6 lb

## 2021-10-29 DIAGNOSIS — R269 Unspecified abnormalities of gait and mobility: Secondary | ICD-10-CM | POA: Diagnosis not present

## 2021-10-29 DIAGNOSIS — Z8616 Personal history of COVID-19: Secondary | ICD-10-CM

## 2021-10-29 DIAGNOSIS — U071 COVID-19: Secondary | ICD-10-CM

## 2021-10-29 DIAGNOSIS — R918 Other nonspecific abnormal finding of lung field: Secondary | ICD-10-CM | POA: Diagnosis not present

## 2021-10-29 MED ORDER — AZITHROMYCIN 250 MG PO TABS: ORAL_TABLET | ORAL | 0 refills | Status: AC

## 2021-10-29 NOTE — Progress Notes (Signed)
Chief Complaint  °Patient presents with  ° Hospitalization Follow-up  ° °F/u with daughter  °Covid 19 + 12/17-12/28/22 [facility] got covid from daughter who she lives no cough, sob just weakness and increased falls esp in the bathroom she did 1 day of paxlovid but stopped due to diarrhea doing better now. Cxr abnormal scarring ? Left infiltrate and small effusion  °PT/OT (coming 2x per week) rec bedside commode use as toilet seat elevator to help with falls she uses rollator and declines handicap sticker bathrom is where she has falls  ° °Review of Systems  °Respiratory:  Negative for shortness of breath.   °Cardiovascular:  Negative for chest pain.  °Musculoskeletal:  Positive for falls.  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  ° Elevated blood pressure   ° Glaucoma   ° History of blood transfusion   ° °Past Surgical History:  °Procedure Laterality Date  ° ABDOMINAL HYSTERECTOMY  1978  ° For fibroids  ° BREAST BIOPSY Right 1990  ° TRANSURETHRAL RESECTION OF BLADDER TUMOR  2011   ° °Family History  °Problem Relation Age of Onset  ° Alcoholism Other   ° Cancer - Lung Other   °     Parent  ° Cancer - Ovarian Mother   ° Prostate cancer Other   °     Other relative  ° Stroke Other   °     Other relative   ° High blood pressure Other   °     Other relative  ° Diabetes Other   °     Other relative  ° °Social History  ° °Socioeconomic History  ° Marital status: Widowed  °  Spouse name: Not on file  ° Number of children: Not on file  ° Years of education: Not on file  ° Highest education level: Not on file  °Occupational History  ° Not on file  °Tobacco Use  ° Smoking status: Never  ° Smokeless tobacco: Never  °Substance and Sexual Activity  ° Alcohol use: No  °  Alcohol/week: 0.0 standard drinks  ° Drug use: No  ° Sexual activity: Not on file  °Other  Topics Concern  ° Not on file  °Social History Narrative  ° Not on file  ° °Social Determinants of Health  ° °Financial Resource Strain: Not on file  °Food Insecurity: Not on file  °Transportation Needs: Not on file  °Physical Activity: Not on file  °Stress: Not on file  °Social Connections: Not on file  °Intimate Partner Violence: Not on file  ° °Current Meds  °Medication Sig  ° azithromycin (ZITHROMAX) 250 MG tablet Take 2 tablets on day 1, then 1 tablet daily on days 2 through 5 with food  ° brimonidine (ALPHAGAN) 0.2 % ophthalmic solution Place 1 drop into both eyes 2 (two) times daily.  ° dorzolamide-timolol (COSOPT) 22.3-6.8 MG/ML ophthalmic solution 1 drop 2 (two) times daily.  ° FeFum-FePo-FA-B Cmp-C-Zn-Mn-Cu (SE-TAN PLUS) 162-115.2-1 MG CAPS TAKE 1 CAPSULE BY MOUTH EVERY DAY  ° latanoprost (XALATAN) 0.005 % ophthalmic solution Place 1 drop into both eyes at bedtime.  ° °Allergies  °Allergen Reactions  ° Iron Nausea And Vomiting  ° Codeine Nausea Only  ° °Recent Results (from the past 2160 hour(s))  °CBC       Status: Abnormal  ° Collection Time: 09/19/21 12:01 PM  °Result Value Ref Range  ° WBC 5.3 4.0 - 10.5 K/uL  ° RBC 3.90 3.87 - 5.11 Mil/uL  ° Platelets 183.0 150.0 - 400.0 K/uL  ° Hemoglobin 11.8 (L) 12.0 - 15.0 g/dL  ° HCT 36.0 36.0 - 46.0 %  ° MCV 92.3 78.0 - 100.0 fl  ° MCHC 32.9 30.0 - 36.0 g/dL  ° RDW 15.3 11.5 - 15.5 %  °Comp Met (CMET)     Status: Abnormal  ° Collection Time: 09/19/21 12:01 PM  °Result Value Ref Range  ° Sodium 140 135 - 145 mEq/L  ° Potassium 4.3 3.5 - 5.1 mEq/L  ° Chloride 107 96 - 112 mEq/L  ° CO2 25 19 - 32 mEq/L  ° Glucose, Bld 94 70 - 99 mg/dL  ° BUN 38 (H) 6 - 23 mg/dL  ° Creatinine, Ser 1.18 0.40 - 1.20 mg/dL  ° Total Bilirubin 0.5 0.2 - 1.2 mg/dL  ° Alkaline Phosphatase 53 39 - 117 U/L  ° AST 19 0 - 37 U/L  ° ALT 16 0 - 35 U/L  ° Total Protein 6.6 6.0 - 8.3 g/dL  ° Albumin 3.9 3.5 - 5.2 g/dL  ° GFR 39.00 (L) >60.00 mL/min  °  Comment: Calculated using the CKD-EPI  Creatinine Equation (2021)  ° Calcium 9.5 8.4 - 10.5 mg/dL  °B12     Status: None  ° Collection Time: 09/19/21 12:01 PM  °Result Value Ref Range  ° Vitamin B-12 270 211 - 911 pg/mL  °TSH     Status: None  ° Collection Time: 09/19/21 12:01 PM  °Result Value Ref Range  ° TSH 3.07 0.35 - 5.50 uIU/mL  °Basic metabolic panel     Status: Abnormal  ° Collection Time: 10/07/21  7:52 PM  °Result Value Ref Range  ° Sodium 138 135 - 145 mmol/L  ° Potassium 4.0 3.5 - 5.1 mmol/L  ° Chloride 108 98 - 111 mmol/L  ° CO2 24 22 - 32 mmol/L  ° Glucose, Bld 119 (H) 70 - 99 mg/dL  °  Comment: Glucose reference range applies only to samples taken after fasting for at least 8 hours.  ° BUN 25 (H) 8 - 23 mg/dL  ° Creatinine, Ser 1.11 (H) 0.44 - 1.00 mg/dL  ° Calcium 9.2 8.9 - 10.3 mg/dL  ° GFR, Estimated 45 (L) >60 mL/min  °  Comment: (NOTE) °Calculated using the CKD-EPI Creatinine Equation (2021) °  ° Anion gap 6 5 - 15  °  Comment: Performed at [facility], [address] [place], [place] [address]  °CBC     Status: Abnormal  ° Collection Time: 10/07/21  7:52 PM  °Result Value Ref Range  ° WBC 5.8 4.0 - 10.5 K/uL  ° RBC 3.79 (L) 3.87 - 5.11 MIL/uL  ° Hemoglobin 11.5 (L) 12.0 - 15.0 g/dL  ° HCT 35.2 (L) 36.0 - 46.0 %  ° MCV 92.9 80.0 - 100.0 fL  ° MCH 30.3 26.0 - 34.0 pg  ° MCHC 32.7 30.0 - 36.0 g/dL  ° RDW 14.2 11.5 - 15.5 %  ° Platelets 150 150 - 400 K/uL  ° nRBC 0.0 0.0 - 0.2 %  °  Comment: Performed at  Hospital [facility], [address] [place], [place] [address]  °Resp Panel by RT-PCR (Flu A&B, Covid) Nasopharyngeal Swab     Status: Abnormal  ° Collection Time: 10/07/21  7:52 PM  ° Specimen: Nasopharyngeal Swab;   Nasopharyngeal(NP) swabs in vial transport medium  Result Value Ref Range   SARS Coronavirus 2 by RT PCR POSITIVE (A) NEGATIVE    Comment: (NOTE) SARS-CoV-2 target nucleic acids are DETECTED.  The SARS-CoV-2 RNA is generally detectable in upper respiratory specimens during the acute phase of infection.  Positive results are indicative of the presence of the identified virus, but do not rule out bacterial infection or co-infection with other pathogens not detected by the test. Clinical correlation with patient history and other diagnostic information is necessary to determine patient infection status. The expected result is Negative.  Fact Sheet for Patients: EntrepreneurPulse.com.au  Fact Sheet for Healthcare Providers: IncredibleEmployment.be  This test is not yet approved or cleared by the [place] FDA and  has been authorized for detection and/or diagnosis of SARS-CoV-2 by FDA under an Emergency Use Authorization (EUA).  This EUA will remain in effect (meaning this test can be used) for the duration of  the COVID-19 declaration under Section 564(b)(1) of the A ct, 21 U.S.C. section 360bbb-3(b)(1), unless the authorization is terminated or revoked sooner.     Influenza A by PCR NEGATIVE NEGATIVE   Influenza B by PCR NEGATIVE NEGATIVE    Comment: (NOTE) The Xpert Xpress SARS-CoV-2/FLU/RSV plus assay is intended as an aid in the diagnosis of influenza from Nasopharyngeal swab specimens and should not be used as a sole basis for treatment. Nasal washings and aspirates are unacceptable for Xpert Xpress SARS-CoV-2/FLU/RSV testing.  Fact Sheet for Patients: EntrepreneurPulse.com.au  Fact Sheet for Healthcare Providers: IncredibleEmployment.be  This test is not yet approved or cleared by the [place] FDA and has been authorized for detection and/or diagnosis of SARS-CoV-2 by FDA under an Emergency Use Authorization (EUA). This EUA will remain in effect (meaning this test can be used) for the duration of the COVID-19 declaration under Section 564(b)(1) of the Act, 21 U.S.C. section 360bbb-3(b)(1), unless the authorization is terminated or revoked.  Performed at [facility], [place], [place] [address]   Troponin I (High Sensitivity)     Status: None   Collection Time: 10/07/21  7:52 PM  Result Value Ref Range   Troponin I (High Sensitivity) 17 <18 ng/L    Comment: (NOTE) Elevated high sensitivity troponin I (hsTnI) values and significant  changes across serial measurements may suggest ACS but many other  chronic and acute conditions are known to elevate hsTnI results.  Refer to the "Links" section for chest pain algorithms and additional  guidance. Performed at [facility], [place], [place] [address]   Troponin I (High Sensitivity)     Status: None   Collection Time: 10/08/21  1:58 AM  Result Value Ref Range   Troponin I (High Sensitivity) 13 <18 ng/L    Comment: (NOTE) Elevated high sensitivity troponin I (hsTnI) values and significant  changes across serial measurements may suggest ACS but many other  chronic and acute conditions are known to elevate hsTnI results.  Refer to the "Links" section for chest pain algorithms and additional  guidance. Performed at [facility], [address] [place], [place] [address]   CBC     Status: Abnormal   Collection Time: 10/09/21  4:57 AM  Result Value Ref Range   WBC 5.0 4.0 - 10.5 K/uL   RBC 4.02 3.87 - 5.11 MIL/uL   Hemoglobin 12.1 12.0 - 15.0 g/dL   HCT 36.5 36.0 - 46.0 %   MCV 90.8 80.0 - 100.0 fL  MCH 30.1 26.0 - 34.0 pg   MCHC 33.2 30.0 - 36.0 g/dL   RDW 14.0 11.5 - 15.5 %   Platelets 142 (L) 150 - 400 K/uL   nRBC 0.0 0.0 - 0.2 %    Comment: Performed at [facility], [address] [place], [place] [address]  Basic metabolic panel     Status: Abnormal   Collection Time: 10/09/21  4:57 AM  Result Value Ref Range   Sodium 136 135 - 145 mmol/L   Potassium 3.8 3.5 - 5.1 mmol/L   Chloride 106 98 - 111 mmol/L   CO2 23 22 - 32 mmol/L   Glucose, Bld 90 70 - 99 mg/dL    Comment: Glucose reference range applies only to samples taken after fasting  for at least 8 hours.   BUN 25 (H) 8 - 23 mg/dL   Creatinine, Ser 1.02 (H) 0.44 - 1.00 mg/dL   Calcium 8.8 (L) 8.9 - 10.3 mg/dL   GFR, Estimated 50 (L) >60 mL/min    Comment: (NOTE) Calculated using the CKD-EPI Creatinine Equation (2021)    Anion gap 7 5 - 15    Comment: Performed at [facility], [address] [place], [place] [address]   Objective  Body mass index is 17.37 kg/m. Wt Readings from Last 3 Encounters:  10/29/21 107 lb 9.6 oz (48.8 kg)  10/08/21 106 lb 7.7 oz (48.3 kg)  09/19/21 108 lb (49 kg)   Temp Readings from Last 3 Encounters:  10/29/21 98.2 F (36.8 C) (Oral)  10/09/21 98.7 F (37.1 C)  09/19/21 97.6 F (36.4 C) (Oral)   BP Readings from Last 3 Encounters:  10/29/21 (!) 110/52  10/09/21 (!) 137/57  09/19/21 110/70   Pulse Readings from Last 3 Encounters:  10/29/21 70  10/09/21 95  09/19/21 64    Physical Exam Vitals and nursing note reviewed.  Constitutional:      Appearance: Normal appearance. She is well-developed and well-groomed.  HENT:     Head: Normocephalic and atraumatic.  Eyes:     Conjunctiva/sclera: Conjunctivae normal.     Pupils: Pupils are equal, round, and reactive to light.  Cardiovascular:     Rate and Rhythm: Normal rate and regular rhythm.     Heart sounds: Normal heart sounds. No murmur heard. Pulmonary:     Effort: Pulmonary effort is normal.     Breath sounds: Normal breath sounds.  Abdominal:     General: Abdomen is flat. Bowel sounds are normal.     Tenderness: There is no abdominal tenderness.  Musculoskeletal:        General: No tenderness.  Skin:    General: Skin is warm and dry.  Neurological:     General: No focal deficit present.     Mental Status: She is alert and oriented to person, place, and time. Mental status is at baseline.     Cranial Nerves: Cranial nerves 2-12 are intact.     Gait: Gait is intact.     Comments: Rollator +  Psychiatric:        Attention and Perception: Attention  and perception normal.        Mood and Affect: Mood and affect normal.        Speech: Speech normal.        Behavior: Behavior normal. Behavior is cooperative.        Thought Content: Thought content normal.        Cognition and Memory: Cognition and memory  normal.     °   Judgment: Judgment normal.  ° ° °Assessment  °Plan  °Infiltrate of left lung present on chest x-ray - Plan: DG Chest 2 View, azithromycin (ZITHROMAX) 250 MG tablet ° °COVID-19 - Plan: DG Chest 2 View ° °Abnormal gait  °Rollator has PT/OT 2x per week and weakness improved uses rollator  ° °F/u PCP as sch °Provider: Dr.  McLean-Scocuzza-[facility] Medicine  °

## 2021-10-29 NOTE — Patient Instructions (Addendum)
Drink more water 50 ounces daily

## 2021-11-04 DIAGNOSIS — H401133 Primary open-angle glaucoma, bilateral, severe stage: Secondary | ICD-10-CM | POA: Diagnosis not present

## 2021-11-04 DIAGNOSIS — H353131 Nonexudative age-related macular degeneration, bilateral, early dry stage: Secondary | ICD-10-CM | POA: Diagnosis not present

## 2021-11-05 DIAGNOSIS — U071 COVID-19: Secondary | ICD-10-CM | POA: Diagnosis not present

## 2021-11-05 DIAGNOSIS — Z9181 History of falling: Secondary | ICD-10-CM | POA: Diagnosis not present

## 2021-11-07 DIAGNOSIS — Z9181 History of falling: Secondary | ICD-10-CM | POA: Diagnosis not present

## 2021-11-07 DIAGNOSIS — U071 COVID-19: Secondary | ICD-10-CM | POA: Diagnosis not present

## 2021-11-12 DIAGNOSIS — Z9181 History of falling: Secondary | ICD-10-CM | POA: Diagnosis not present

## 2021-11-12 DIAGNOSIS — U071 COVID-19: Secondary | ICD-10-CM | POA: Diagnosis not present

## 2021-11-13 DIAGNOSIS — Z9181 History of falling: Secondary | ICD-10-CM | POA: Diagnosis not present

## 2021-11-13 DIAGNOSIS — U071 COVID-19: Secondary | ICD-10-CM | POA: Diagnosis not present

## 2021-12-04 DIAGNOSIS — H353290 Exudative age-related macular degeneration, unspecified eye, stage unspecified: Secondary | ICD-10-CM | POA: Diagnosis not present

## 2021-12-04 DIAGNOSIS — I951 Orthostatic hypotension: Secondary | ICD-10-CM | POA: Diagnosis not present

## 2021-12-04 DIAGNOSIS — F0390 Unspecified dementia without behavioral disturbance: Secondary | ICD-10-CM | POA: Diagnosis not present

## 2021-12-05 ENCOUNTER — Telehealth: Payer: Self-pay | Admitting: Family Medicine

## 2021-12-05 ENCOUNTER — Encounter: Payer: Self-pay | Admitting: Internal Medicine

## 2021-12-05 ENCOUNTER — Inpatient Hospital Stay: Payer: PPO | Attending: Internal Medicine

## 2021-12-05 ENCOUNTER — Other Ambulatory Visit: Payer: Self-pay

## 2021-12-05 ENCOUNTER — Inpatient Hospital Stay (HOSPITAL_BASED_OUTPATIENT_CLINIC_OR_DEPARTMENT_OTHER): Payer: PPO | Admitting: Internal Medicine

## 2021-12-05 VITALS — BP 154/49 | HR 62 | Temp 95.9°F | Ht 66.0 in | Wt 109.0 lb

## 2021-12-05 DIAGNOSIS — N183 Chronic kidney disease, stage 3 unspecified: Secondary | ICD-10-CM | POA: Diagnosis not present

## 2021-12-05 DIAGNOSIS — C8581 Other specified types of non-Hodgkin lymphoma, lymph nodes of head, face, and neck: Secondary | ICD-10-CM

## 2021-12-05 DIAGNOSIS — C672 Malignant neoplasm of lateral wall of bladder: Secondary | ICD-10-CM | POA: Diagnosis not present

## 2021-12-05 DIAGNOSIS — R42 Dizziness and giddiness: Secondary | ICD-10-CM | POA: Diagnosis not present

## 2021-12-05 DIAGNOSIS — Z8572 Personal history of non-Hodgkin lymphomas: Secondary | ICD-10-CM | POA: Insufficient documentation

## 2021-12-05 DIAGNOSIS — D509 Iron deficiency anemia, unspecified: Secondary | ICD-10-CM | POA: Insufficient documentation

## 2021-12-05 LAB — COMPREHENSIVE METABOLIC PANEL
ALT: 28 U/L (ref 0–44)
AST: 30 U/L (ref 15–41)
Albumin: 3.9 g/dL (ref 3.5–5.0)
Alkaline Phosphatase: 62 U/L (ref 38–126)
Anion gap: 11 (ref 5–15)
BUN: 27 mg/dL — ABNORMAL HIGH (ref 8–23)
CO2: 25 mmol/L (ref 22–32)
Calcium: 9.7 mg/dL (ref 8.9–10.3)
Chloride: 106 mmol/L (ref 98–111)
Creatinine, Ser: 1.09 mg/dL — ABNORMAL HIGH (ref 0.44–1.00)
GFR, Estimated: 46 mL/min — ABNORMAL LOW (ref >60)
Glucose, Bld: 91 mg/dL (ref 70–99)
Potassium: 4.3 mmol/L (ref 3.5–5.1)
Sodium: 142 mmol/L (ref 135–145)
Total Bilirubin: 0.3 mg/dL (ref 0.3–1.2)
Total Protein: 7 g/dL (ref 6.5–8.1)

## 2021-12-05 LAB — CBC WITH DIFFERENTIAL/PLATELET
Abs Immature Granulocytes: 0.02 10*3/uL (ref 0.00–0.07)
Basophils Absolute: 0 10*3/uL (ref 0.0–0.1)
Basophils Relative: 1 %
Eosinophils Absolute: 0.1 10*3/uL (ref 0.0–0.5)
Eosinophils Relative: 2 %
HCT: 38.7 % (ref 36.0–46.0)
Hemoglobin: 12.4 g/dL (ref 12.0–15.0)
Immature Granulocytes: 0 %
Lymphocytes Relative: 28 %
Lymphs Abs: 1.3 10*3/uL (ref 0.7–4.0)
MCH: 30.5 pg (ref 26.0–34.0)
MCHC: 32 g/dL (ref 30.0–36.0)
MCV: 95.1 fL (ref 80.0–100.0)
Monocytes Absolute: 0.4 10*3/uL (ref 0.1–1.0)
Monocytes Relative: 9 %
Neutro Abs: 2.8 10*3/uL (ref 1.7–7.7)
Neutrophils Relative %: 60 %
Platelets: 176 10*3/uL (ref 150–400)
RBC: 4.07 MIL/uL (ref 3.87–5.11)
RDW: 14.5 % (ref 11.5–15.5)
WBC: 4.7 10*3/uL (ref 4.0–10.5)
nRBC: 0 % (ref 0.0–0.2)

## 2021-12-05 NOTE — Progress Notes (Signed)
C/o feeling dizzy and weak x4 days.

## 2021-12-05 NOTE — Assessment & Plan Note (Addendum)
Diffuse large B-cell lymphoma s/p Rituxan-  Clinically no evidence of progression noted. last Rituxan- June 2018; PET- DEC 2019  no evidence of lymphoma noted; however mild continued right hilar uptake [likely reactive].  STABLE.   # Right LE DVT/ History of PE [March 2017]- OFF xarelto- AUg 2022;v stable notice of blood clots.  # Mild Anemia-/IDA- Improved on PO iron. Continue p.o. iron.  # CKD- stage III- STABLE GFR-42.   # Dizziness Orthostatics [PCP]; awaiting cardiology evaluation. Compression stocking.   #DISPOSITION: will call if CMP is abnormal # follow up in 6 months-MD- /labs-cbc/cmp- Dr.B

## 2021-12-05 NOTE — Progress Notes (Signed)
[facility] OFFICE PROGRESS NOTE  Patient Care Team: [name], MD as PCP - General (Family Medicine) Christene Lye, MD as Consulting Physician (General Surgery) System, Provider Not In   Oncology History Overview Note  4.  88yo- year-old lady presented with large left neck mass and abdominal palpable mass.  Biopsy of left neck mass is being positive for diffuse B large cell lymphoma; Clinically staged as stage III (June, 2016) 2.  Patient did not want any chemotherapy so was started on prednisone followed by Rituxan in June of 2016 with excellent response 3.  Patient has finished 4 weekly cycle of rituximab on April 16, 2015 with excellent response  4.  Because of bulky disease in the past patient is getting maintenance Rituxan therapy started in September of 2016; STOPPED in June 2018.   # R LE DVT- was found to have a deep vein thrombosis on xeralto.  (March, 2017); And right-sided pulmonary emboli;SEP 2022- STOP xarelto [sec to falls- dementia]  # AUG 2017- PET- NED.     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  Accompanied by her daughter.  Hard of hearing  Lisa Sosa 88 y.o.  female pleasant patient above history of Diffuse large B-cell lymphoma/DVT PE on Xarelto; mild dementia currently on surveillance is here for follow-up.  In the interim diagnosed with orthostatic hypotension [PCP]; C/o feeling dizzy and weak.  No recent falls.  Of note patient had COVID infection in December  2022  Denies any new lumps or bumps.  Review of Systems  Constitutional:  Positive for malaise/fatigue. Negative for chills, diaphoresis, fever and weight loss.  HENT:  Negative for nosebleeds and sore throat.   Eyes:  Negative for double vision.  Respiratory:  Negative for cough, hemoptysis, sputum production, shortness of breath and wheezing.   Cardiovascular:  Negative for chest pain, palpitations, orthopnea and leg swelling.  Gastrointestinal:  Negative for abdominal pain, blood in stool, constipation, diarrhea, heartburn, melena, nausea and vomiting.  Genitourinary:  Negative for dysuria, frequency and urgency.  Musculoskeletal:  Positive for back pain and joint pain.  Skin: Negative.  Negative for itching and rash.  Neurological:  Negative for dizziness, tingling, focal weakness, weakness and headaches.  Endo/Heme/Allergies:  Does not bruise/bleed easily.  Psychiatric/Behavioral:  Negative for depression. The patient is not nervous/anxious and does not have insomnia.     PAST MEDICAL HISTORY :  Past Medical History:  Diagnosis Date   Cancer ([place]) 10/12/2009   gallbladder   Chickenpox    COVID-19    10/07/21   Diffuse large B cell lymphoma ([place]) 03/17/2015   Elevated blood pressure    Glaucoma    History of blood transfusion     PAST SURGICAL HISTORY :   Past Surgical History:  Procedure Laterality Date   ABDOMINAL HYSTERECTOMY  1978   For fibroids   BREAST BIOPSY Right 1990   TRANSURETHRAL RESECTION OF BLADDER TUMOR  2011     FAMILY HISTORY :  Family History  Problem Relation Age of Onset   Alcoholism Other    Cancer - Lung Other        Parent   Cancer - Ovarian Mother    Prostate cancer Other        Other relative   Stroke Other        Other relative    High blood pressure Other        Other relative   Diabetes Other        Other relative    SOCIAL HISTORY:   Social History   Tobacco Use   Smoking status: Never   Smokeless tobacco: Never   Substance Use Topics   Alcohol use: No    Alcohol/week: 0.0 standard drinks   Drug use: No    ALLERGIES:  is allergic to iron and codeine.  MEDICATIONS:  Current Outpatient Medications  Medication Sig Dispense Refill   brimonidine (ALPHAGAN) 0.2 % ophthalmic solution Place 1 drop into both eyes 2 (two) times daily.     dorzolamide-timolol (COSOPT) 22.3-6.8 MG/ML ophthalmic solution 1 drop 2 (two) times daily.     FeFum-FePo-FA-B Cmp-C-Zn-Mn-Cu (SE-TAN PLUS) 162-115.2-1 MG CAPS TAKE 1 CAPSULE BY MOUTH EVERY DAY 30 capsule 4   latanoprost (XALATAN) 0.005 % ophthalmic solution Place 1 drop into both eyes at bedtime.     No current facility-administered medications for this visit.    PHYSICAL EXAMINATION: ECOG PERFORMANCE STATUS: 2 - Symptomatic, <50% confined to bed  BP (!) 154/49 (BP Location: Right Arm, Patient Position: Sitting, Cuff Size: Normal)    Pulse 62    Temp (!) 95.9 F (35.5 C) (Tympanic)    Ht 5\' 6"  (1.676 m)    Wt 109 lb (49.4 kg)    SpO2 100%    BMI 17.59 kg/m   Filed Weights   12/05/21 1429  Weight: 109 lb (49.4 kg)    Physical Exam Constitutional:      Comments: [place] Caucasian female patient.  She is accompanied by daughter.  She is walking with a rolling walker.  HENT:     Head: Normocephalic and atraumatic.     Mouth/Throat:     Pharynx: No oropharyngeal exudate.  Eyes:     Pupils: Pupils are equal, round, and reactive to light.  Cardiovascular:     Rate and Rhythm: Normal rate and regular rhythm.  Pulmonary:     Effort: No respiratory distress.     Breath sounds: No wheezing.  Abdominal:     General: Bowel sounds are normal. There is no distension.     Palpations: Abdomen is soft. There is no mass.     Tenderness: There is no abdominal tenderness. There is no guarding or rebound.  Musculoskeletal:        General: No tenderness. Normal range of motion.     Cervical back: Normal range of motion and neck supple.  Skin:    General: Skin  is warm.  Neurological:     Mental Status: She is alert and oriented to person, place, and time.  Psychiatric:        Mood and Affect: Affect normal.     LABORATORY DATA:  I have reviewed the data as listed    Component Value Date/Time   NA 142 12/05/2021 1417   K 4.3 12/05/2021 1417   CL 106 12/05/2021 1417   CO2 25 12/05/2021 1417   GLUCOSE 91 12/05/2021 1417   BUN 27 (H) 12/05/2021 1417   CREATININE 1.09 (H)  12/05/2021 1417   CALCIUM 9.7 12/05/2021 1417   PROT 7.0 12/05/2021 1417   ALBUMIN 3.9 12/05/2021 1417   AST 30 12/05/2021 1417   ALT 28 12/05/2021 1417   ALKPHOS 62 12/05/2021 1417   BILITOT 0.3 12/05/2021 1417   GFRNONAA 46 (L) 12/05/2021 1417   GFRAA 50 (L) 06/05/2020 1435    No results found for: SPEP, UPEP  Lab Results  Component Value Date   WBC 4.7 12/05/2021   NEUTROABS 2.8 12/05/2021   HGB 12.4 12/05/2021   HCT 38.7 12/05/2021   MCV 95.1 12/05/2021   PLT 176 12/05/2021      Chemistry      Component Value Date/Time   NA 142 12/05/2021 1417   K 4.3 12/05/2021 1417   CL 106 12/05/2021 1417   CO2 25 12/05/2021 1417   BUN 27 (H) 12/05/2021 1417   CREATININE 1.09 (H) 12/05/2021 1417      Component Value Date/Time   CALCIUM 9.7 12/05/2021 1417   ALKPHOS 62 12/05/2021 1417   AST 30 12/05/2021 1417   ALT 28 12/05/2021 1417   BILITOT 0.3 12/05/2021 1417       RADIOGRAPHIC STUDIES: I have personally reviewed the radiological images as listed and agreed with the findings in the report. No results found.   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 last Rituxan- June 2018; PET- DEC 2019  no evidence of lymphoma noted; however mild continued right hilar uptake [likely reactive].  STABLE.   # Right LE DVT/ History of PE [March 2017]- OFF xarelto- AUg 2022;v stable notice of blood clots.  # Mild Anemia-/IDA- Improved on PO iron.  Continue p.o. iron.  # CKD- stage III- STABLE GFR-42.   # Dizziness Orthostatics [PCP]; awaiting cardiology evaluation. Compression stocking.   #DISPOSITION: will call if CMP is abnormal # follow up in 6 months-MD- /labs-cbc/cmp- Dr.B      Orders Placed This Encounter  Procedures   CBC with Differential/Platelet    Standing Status:   Future    Standing Expiration Date:   12/05/2022   Comprehensive metabolic panel    Standing Status:   Future    Standing Expiration Date:   12/05/2022   All questions were answered. The patient knows to call the clinic with any problems, questions or concerns.      Cammie Sickle, MD 12/05/2021 3:30 PM

## 2021-12-05 NOTE — Telephone Encounter (Signed)
Olivia Mackie, NP with [facility], [phone]. Patient was seen yesterday, she observed patient having some dizziness., daughter states she has had mild dizziness for days and does not drink enough water.

## 2021-12-08 NOTE — Telephone Encounter (Signed)
I called and spoke with the patients daughter and she agreed that the patient needs to be seen and the patienit is scheduled to see provider for dizziness on wednesday. Jeffifer Rabold,cma

## 2021-12-10 ENCOUNTER — Ambulatory Visit (INDEPENDENT_AMBULATORY_CARE_PROVIDER_SITE_OTHER): Payer: PPO | Admitting: Family Medicine

## 2021-12-10 ENCOUNTER — Ambulatory Visit (INDEPENDENT_AMBULATORY_CARE_PROVIDER_SITE_OTHER): Payer: PPO

## 2021-12-10 ENCOUNTER — Other Ambulatory Visit: Payer: Self-pay

## 2021-12-10 ENCOUNTER — Encounter: Payer: Self-pay | Admitting: Family Medicine

## 2021-12-10 VITALS — BP 90/50 | HR 66 | Temp 97.7°F | Ht 66.0 in | Wt 107.2 lb

## 2021-12-10 DIAGNOSIS — C8581 Other specified types of non-Hodgkin lymphoma, lymph nodes of head, face, and neck: Secondary | ICD-10-CM | POA: Diagnosis not present

## 2021-12-10 DIAGNOSIS — U071 COVID-19: Secondary | ICD-10-CM | POA: Diagnosis not present

## 2021-12-10 DIAGNOSIS — H9193 Unspecified hearing loss, bilateral: Secondary | ICD-10-CM

## 2021-12-10 DIAGNOSIS — J449 Chronic obstructive pulmonary disease, unspecified: Secondary | ICD-10-CM | POA: Diagnosis not present

## 2021-12-10 DIAGNOSIS — R9389 Abnormal findings on diagnostic imaging of other specified body structures: Secondary | ICD-10-CM

## 2021-12-10 DIAGNOSIS — I951 Orthostatic hypotension: Secondary | ICD-10-CM

## 2021-12-10 NOTE — Patient Instructions (Signed)
Nice to see you. ?I will contact you with your x-ray results. ?Please try to get 60 to 70 ounces of noncaffeinated liquids per day.  If the lightheadedness is not improving with adequate fluid intake please let me know and we can refer you to cardiology. ?

## 2021-12-10 NOTE — Progress Notes (Signed)
?Tommi Rumps, MD ?Phone: [phone] ? ?Lisa Sosa is a 88 y.o. female who presents today for f/u. ? ?Lightheadedness: This has been going on a little over a week.  They have gotten blood pressures into the 100s over 50s.  Her daughter feels as though this has gotten somewhat better recently.  She does not drink much noncaffeinated liquids though they have been trying to get her to drink some more Gatorade.  She recently saw hematology and had a reassuring CBC and CMP. ? ?COVID-19: Patient had this previously.  She had a chest x-ray with a questionable infiltrate.  She saw Dr. Terese Door about a month later and was treated with azithromycin despite not having any symptoms at that time.  She never had a follow-up chest x-ray. ? ?Hearing loss: Her daughter reports they had this checked and she has no hearing in her left ear.  The hearing in her right ear is not good.  They deferred hearing aids. ? ?Lymphoma: She has been following with oncology.  Per their note things have been stable. ? ?Social History  ? ?Tobacco Use  ?Smoking Status Never  ?Smokeless Tobacco Never  ? ? ?Current Outpatient Medications on File Prior to Visit  ?Medication Sig Dispense Refill  ? brimonidine (ALPHAGAN) 0.2 % ophthalmic solution Place 1 drop into both eyes 2 (two) times daily.    ? dorzolamide-timolol (COSOPT) 22.3-6.8 MG/ML ophthalmic solution 1 drop 2 (two) times daily.    ? FeFum-FePo-FA-B Cmp-C-Zn-Mn-Cu (SE-TAN PLUS) 162-115.2-1 MG CAPS TAKE 1 CAPSULE BY MOUTH EVERY DAY 30 capsule 4  ? latanoprost (XALATAN) 0.005 % ophthalmic solution Place 1 drop into both eyes at bedtime.    ? ?No current facility-administered medications on file prior to visit.  ? ? ? ?ROS see history of present illness ? ?Objective ? ?Physical Exam ?Vitals:  ? 12/10/21 1219  ?BP: (!) 90/50  ?Pulse: 66  ?Temp: 97.7 ?F (36.5 ?C)  ?SpO2: 99%  ? ?Lying blood pressure 121/73 pulse 64 ?Sitting blood pressure 114/72 pulse 67 ?Standing blood pressure  100/66 pulse 65 ? ?BP Readings from Last 3 Encounters:  ?12/10/21 (!) 90/50  ?12/05/21 (!) 154/49  ?10/29/21 (!) 110/52  ? ?Wt Readings from Last 3 Encounters:  ?12/10/21 107 lb 3.2 oz (48.6 kg)  ?12/05/21 109 lb (49.4 kg)  ?10/29/21 107 lb 9.6 oz (48.8 kg)  ? ? ?Physical Exam ?Constitutional:   ?   General: She is not in acute distress. ?   Appearance: She is not diaphoretic.  ?Cardiovascular:  ?   Rate and Rhythm: Normal rate and regular rhythm.  ?   Heart sounds: Normal heart sounds.  ?   Comments: No carotid bruits ?Pulmonary:  ?   Effort: Pulmonary effort is normal.  ?   Breath sounds: Normal breath sounds.  ?Skin: ?   General: Skin is warm and dry.  ?Neurological:  ?   Mental Status: She is alert.  ? ? ? ?Assessment/Plan: Please see individual problem list. ? ?Problem List Items Addressed This Visit   ? ? COVID-19 virus infection  ?  The patient has recovered.  She needs a follow-up chest x-ray. ?  ?  ? Hearing difficulty of both ears  ?  Chronic issue.  They have deferred hearing aids. ?  ?  ? 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  Per oncology this is stable.  She will continue to see her oncologist. ?  ?  ? Orthostasis - Primary  ?  Lightheadedness recently with orthostasis today.  Discussed increasing fluid intake.  CBC and renal function were acceptable recently.  Advised to monitor and if her blood pressure trends down at all or if its not improving with increased fluid intake they should let us know and we will refer to cardiology. ?  ?  ? ?Other Visit Diagnoses   ? ? Abnormal chest x-ray      ? Relevant Orders  ? DG Chest 2 View  ? ?  ? ? ?Return in about 3 months (around 03/12/2022) for Orthostasis. ? ?This visit occurred during the SARS-CoV-2 public health emergency.  Safety protocols were in place, including screening questions prior to the visit, additional usage of staff PPE, and extensive cleaning of exam room while observing appropriate contact time as indicated for disinfecting  solutions.  ? ? ?Tommi Rumps, MD ?[place] ? ?

## 2021-12-10 NOTE — Assessment & Plan Note (Signed)
Per oncology this is stable.  She will continue to see her oncologist. ?

## 2021-12-10 NOTE — Assessment & Plan Note (Signed)
Lightheadedness recently with orthostasis today.  Discussed increasing fluid intake.  CBC and renal function were acceptable recently.  Advised to monitor and if her blood pressure trends down at all or if its not improving with increased fluid intake they should let us know and we will refer to cardiology. ?

## 2021-12-10 NOTE — Assessment & Plan Note (Signed)
The patient has recovered.  She needs a follow-up chest x-ray. ?

## 2021-12-10 NOTE — Assessment & Plan Note (Signed)
Chronic issue.  They have deferred hearing aids. ?

## 2021-12-19 ENCOUNTER — Ambulatory Visit: Payer: PPO | Admitting: Family Medicine

## 2021-12-29 ENCOUNTER — Other Ambulatory Visit: Payer: Self-pay | Admitting: Internal Medicine

## 2022-03-12 DIAGNOSIS — H353131 Nonexudative age-related macular degeneration, bilateral, early dry stage: Secondary | ICD-10-CM | POA: Diagnosis not present

## 2022-03-12 DIAGNOSIS — H401133 Primary open-angle glaucoma, bilateral, severe stage: Secondary | ICD-10-CM | POA: Diagnosis not present

## 2022-03-13 ENCOUNTER — Encounter: Payer: Self-pay | Admitting: Family Medicine

## 2022-03-13 ENCOUNTER — Ambulatory Visit (INDEPENDENT_AMBULATORY_CARE_PROVIDER_SITE_OTHER): Payer: PPO | Admitting: Family Medicine

## 2022-03-13 DIAGNOSIS — I951 Orthostatic hypotension: Secondary | ICD-10-CM | POA: Diagnosis not present

## 2022-03-13 NOTE — Assessment & Plan Note (Signed)
Improved. I encouraged increasing hydration to 5 glasses of water per day. They will monitor for recurrent light headedness.

## 2022-03-13 NOTE — Progress Notes (Signed)
  Tommi Rumps, MD Phone: [phone]  Lisa Sosa is a 88 y.o. female who presents today for follow-up.  Orthostasis: The patient's daughter notes her blood pressure at home is typically similar to today.  She notes no lightheadedness.  She is getting 3 glasses of water daily.  She gets occasional tomato juice and Gatorade.  Social History   Tobacco Use  Smoking Status Never  Smokeless Tobacco Never    Current Outpatient Medications on File Prior to Visit  Medication Sig Dispense Refill   brimonidine (ALPHAGAN) 0.2 % ophthalmic solution Place 1 drop into both eyes 2 (two) times daily.     dorzolamide-timolol (COSOPT) 22.3-6.8 MG/ML ophthalmic solution 1 drop 2 (two) times daily.     FeFum-FePo-FA-B Cmp-C-Zn-Mn-Cu (SE-TAN PLUS) 162-115.2-1 MG CAPS TAKE 1 CAPSULE BY MOUTH EVERY DAY 30 capsule 4   latanoprost (XALATAN) 0.005 % ophthalmic solution Place 1 drop into both eyes at bedtime.     No current facility-administered medications on file prior to visit.     ROS see history of present illness  Objective  Physical Exam Vitals:   03/13/22 1434  BP: 110/60  Pulse: 73  Temp: 98.9 F (37.2 C)  SpO2: 97%    BP Readings from Last 3 Encounters:  03/13/22 110/60  12/10/21 (!) 90/50  12/05/21 (!) 154/49   Wt Readings from Last 3 Encounters:  03/13/22 110 lb 3.2 oz (50 kg)  12/10/21 107 lb 3.2 oz (48.6 kg)  12/05/21 109 lb (49.4 kg)    Physical Exam Constitutional:      General: She is not in acute distress.    Appearance: She is not diaphoretic.  Cardiovascular:     Rate and Rhythm: Normal rate and regular rhythm.     Heart sounds: Normal heart sounds.  Pulmonary:     Effort: Pulmonary effort is normal.     Breath sounds: Normal breath sounds.  Skin:    General: Skin is warm and dry.  Neurological:     Mental Status: She is alert.     Assessment/Plan: Please see individual problem list.  Problem List Items Addressed This Visit     Orthostasis     Improved. I encouraged increasing hydration to 5 glasses of water per day. They will monitor for recurrent light headedness.          Return in about 6 months (around 09/12/2022).   Tommi Rumps, MD [place]

## 2022-03-13 NOTE — Patient Instructions (Signed)
Please increase your water to 5 glasses per day.  If the light headedness returns please let me know.

## 2022-06-04 DIAGNOSIS — Z515 Encounter for palliative care: Secondary | ICD-10-CM | POA: Diagnosis not present

## 2022-06-04 DIAGNOSIS — H9190 Unspecified hearing loss, unspecified ear: Secondary | ICD-10-CM | POA: Diagnosis not present

## 2022-06-04 DIAGNOSIS — H353190 Nonexudative age-related macular degeneration, unspecified eye, stage unspecified: Secondary | ICD-10-CM | POA: Diagnosis not present

## 2022-06-04 DIAGNOSIS — F0390 Unspecified dementia without behavioral disturbance: Secondary | ICD-10-CM | POA: Diagnosis not present

## 2022-06-05 ENCOUNTER — Encounter: Payer: Self-pay | Admitting: Internal Medicine

## 2022-06-05 ENCOUNTER — Inpatient Hospital Stay: Payer: PPO | Admitting: Internal Medicine

## 2022-06-05 ENCOUNTER — Inpatient Hospital Stay: Payer: PPO | Attending: Internal Medicine

## 2022-06-05 DIAGNOSIS — N183 Chronic kidney disease, stage 3 unspecified: Secondary | ICD-10-CM | POA: Insufficient documentation

## 2022-06-05 DIAGNOSIS — Z801 Family history of malignant neoplasm of trachea, bronchus and lung: Secondary | ICD-10-CM | POA: Diagnosis not present

## 2022-06-05 DIAGNOSIS — Z90710 Acquired absence of both cervix and uterus: Secondary | ICD-10-CM | POA: Insufficient documentation

## 2022-06-05 DIAGNOSIS — Z8579 Personal history of other malignant neoplasms of lymphoid, hematopoietic and related tissues: Secondary | ICD-10-CM | POA: Insufficient documentation

## 2022-06-05 DIAGNOSIS — Z8616 Personal history of COVID-19: Secondary | ICD-10-CM | POA: Insufficient documentation

## 2022-06-05 DIAGNOSIS — Z86718 Personal history of other venous thrombosis and embolism: Secondary | ICD-10-CM | POA: Insufficient documentation

## 2022-06-05 DIAGNOSIS — C8581 Other specified types of non-Hodgkin lymphoma, lymph nodes of head, face, and neck: Secondary | ICD-10-CM | POA: Diagnosis not present

## 2022-06-05 DIAGNOSIS — D509 Iron deficiency anemia, unspecified: Secondary | ICD-10-CM | POA: Diagnosis not present

## 2022-06-05 DIAGNOSIS — Z8042 Family history of malignant neoplasm of prostate: Secondary | ICD-10-CM | POA: Diagnosis not present

## 2022-06-05 DIAGNOSIS — Z86711 Personal history of pulmonary embolism: Secondary | ICD-10-CM | POA: Diagnosis not present

## 2022-06-05 DIAGNOSIS — Z8551 Personal history of malignant neoplasm of bladder: Secondary | ICD-10-CM | POA: Insufficient documentation

## 2022-06-05 DIAGNOSIS — Z8041 Family history of malignant neoplasm of ovary: Secondary | ICD-10-CM | POA: Insufficient documentation

## 2022-06-05 LAB — CBC WITH DIFFERENTIAL/PLATELET
Abs Immature Granulocytes: 0.01 10*3/uL (ref 0.00–0.07)
Basophils Absolute: 0 10*3/uL (ref 0.0–0.1)
Basophils Relative: 0 %
Eosinophils Absolute: 0.1 10*3/uL (ref 0.0–0.5)
Eosinophils Relative: 2 %
HCT: 35.8 % — ABNORMAL LOW (ref 36.0–46.0)
Hemoglobin: 11.7 g/dL — ABNORMAL LOW (ref 12.0–15.0)
Immature Granulocytes: 0 %
Lymphocytes Relative: 21 %
Lymphs Abs: 1.2 10*3/uL (ref 0.7–4.0)
MCH: 30.5 pg (ref 26.0–34.0)
MCHC: 32.7 g/dL (ref 30.0–36.0)
MCV: 93.5 fL (ref 80.0–100.0)
Monocytes Absolute: 0.4 10*3/uL (ref 0.1–1.0)
Monocytes Relative: 8 %
Neutro Abs: 3.8 10*3/uL (ref 1.7–7.7)
Neutrophils Relative %: 69 %
Platelets: 159 10*3/uL (ref 150–400)
RBC: 3.83 MIL/uL — ABNORMAL LOW (ref 3.87–5.11)
RDW: 13.7 % (ref 11.5–15.5)
WBC: 5.5 10*3/uL (ref 4.0–10.5)
nRBC: 0 % (ref 0.0–0.2)

## 2022-06-05 LAB — COMPREHENSIVE METABOLIC PANEL
ALT: 21 U/L (ref 0–44)
AST: 24 U/L (ref 15–41)
Albumin: 3.9 g/dL (ref 3.5–5.0)
Alkaline Phosphatase: 75 U/L (ref 38–126)
Anion gap: 6 (ref 5–15)
BUN: 33 mg/dL — ABNORMAL HIGH (ref 8–23)
CO2: 25 mmol/L (ref 22–32)
Calcium: 8.9 mg/dL (ref 8.9–10.3)
Chloride: 107 mmol/L (ref 98–111)
Creatinine, Ser: 1.11 mg/dL — ABNORMAL HIGH (ref 0.44–1.00)
GFR, Estimated: 45 mL/min — ABNORMAL LOW (ref >60)
Glucose, Bld: 124 mg/dL — ABNORMAL HIGH (ref 70–99)
Potassium: 4.4 mmol/L (ref 3.5–5.1)
Sodium: 138 mmol/L (ref 135–145)
Total Bilirubin: 0.6 mg/dL (ref 0.3–1.2)
Total Protein: 6.8 g/dL (ref 6.5–8.1)

## 2022-06-05 NOTE — Progress Notes (Signed)
[place] OFFICE PROGRESS NOTE  Patient Care Team: [name], MD as PCP - General (Family Medicine) Christene Lye, MD as Consulting Physician (General Surgery) System, Provider Not In [place], MD as Consulting Physician (Oncology)   Oncology History Overview Note  5.  88yo- year-old lady presented with large left neck mass and abdominal palpable mass.  Biopsy of left neck mass is being positive for diffuse B large cell lymphoma; Clinically staged as stage III (June, 2016) 2.  Patient did not want any chemotherapy so was started on prednisone followed by Rituxan in June of 2016 with excellent response 3.  Patient has finished 4 weekly cycle of rituximab on April 16, 2015 with excellent response  4.  Because of bulky disease in the past patient is getting maintenance Rituxan therapy started in September of 2016; STOPPED in June 2018.   # R LE DVT- was found to have a deep vein thrombosis on xeralto.  (March, 2017); And right-sided pulmonary emboli;SEP 2022- STOP xarelto [sec to falls- dementia]  # AUG 2017- PET- NED.     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  Accompanied by her daughter.  Hard of hearing  Lisa Sosa 88 y.o.  female pleasant patient above history of Diffuse large B-cell lymphoma/DVT PE currently off Xarelto [Hx of falls]; mild dementia currently on surveillance is here for follow-up.  Denies any new lumps or bumps. Hx of fall- in carpet.  Witnessed..  Did not  hit her head.  Review of Systems  Constitutional:  Positive for malaise/fatigue. Negative for chills, diaphoresis, fever and weight loss.  HENT:  Negative for nosebleeds and sore throat.   Eyes:  Negative for double vision.  Respiratory:  Negative for cough, hemoptysis, sputum production, shortness of breath and wheezing.   Cardiovascular:  Negative for chest pain, palpitations, orthopnea and leg swelling.  Gastrointestinal:  Negative for abdominal pain, blood in stool, constipation, diarrhea, heartburn, melena, nausea and vomiting.  Genitourinary:  Negative for dysuria, frequency and urgency.  Musculoskeletal:  Positive for back pain and joint pain.  Skin: Negative.  Negative for itching and rash.  Neurological:  Negative for dizziness, tingling, focal weakness, weakness and headaches.  Endo/Heme/Allergies:  Does not bruise/bleed easily.  Psychiatric/Behavioral:  Negative for depression. The patient is not nervous/anxious and does not have insomnia.      PAST MEDICAL HISTORY :  Past Medical History:  Diagnosis Date   Cancer ([place]) 10/12/2009   gallbladder   Chickenpox    COVID-19    10/07/21   Diffuse large B cell lymphoma ([place]) 03/17/2015   Elevated blood pressure    Glaucoma    History of blood transfusion     PAST SURGICAL HISTORY :   Past Surgical History:  Procedure Laterality Date   ABDOMINAL HYSTERECTOMY  1978   For fibroids   BREAST BIOPSY Right 1990   TRANSURETHRAL RESECTION OF BLADDER TUMOR  2011     FAMILY HISTORY :  Family History  Problem Relation Age of Onset   Alcoholism Other    Cancer - Lung Other        Parent   Cancer - Ovarian Mother    Prostate cancer Other        Other relative   Stroke Other        Other relative    High blood pressure Other        Other relative   Diabetes Other        Other relative    SOCIAL HISTORY:   Social History   Tobacco Use   Smoking status: Never   Smokeless tobacco: Never  Substance Use Topics    Alcohol use: No    Alcohol/week: 0.0 standard drinks of alcohol   Drug use: No    ALLERGIES:  is allergic to iron and codeine.  MEDICATIONS:  Current Outpatient Medications  Medication Sig Dispense Refill   brimonidine (ALPHAGAN) 0.2 % ophthalmic solution Place 1 drop into both eyes 2 (two) times daily.     dorzolamide-timolol (COSOPT) 22.3-6.8 MG/ML ophthalmic solution 1 drop 2 (two) times daily.     FeFum-FePo-FA-B Cmp-C-Zn-Mn-Cu (SE-TAN PLUS) 162-115.2-1 MG CAPS TAKE 1 CAPSULE BY MOUTH EVERY DAY 30 capsule 4   latanoprost (XALATAN) 0.005 % ophthalmic solution Place 1 drop into both eyes at bedtime.     No current facility-administered medications for this visit.    PHYSICAL EXAMINATION: ECOG PERFORMANCE STATUS: 2 - Symptomatic, <50% confined to bed  BP (!) 118/56 (BP Location: Left Arm, Patient Position: Sitting)   Pulse 66   Temp (!) 96.8 F (36 C) (Tympanic)   Resp 16   Wt 106 lb (48.1 kg)   BMI 17.11 kg/m   Filed Weights   06/05/22 1400  Weight: 106 lb (48.1 kg)     Physical Exam Constitutional:      Comments: Frail-appearing Caucasian female patient.  She is accompanied by daughter.  She is walking with a rolling walker.  HENT:     Head: Normocephalic and atraumatic.     Mouth/Throat:     Pharynx: No oropharyngeal exudate.  Eyes:     Pupils: Pupils are equal, round, and reactive to light.  Cardiovascular:     Rate and Rhythm: Normal rate and regular rhythm.  Pulmonary:     Effort: No respiratory distress.     Breath sounds: No wheezing.  Abdominal:     General: Bowel sounds are normal. There is no distension.     Palpations: Abdomen is soft. There is no mass.     Tenderness: There is no abdominal tenderness. There is no guarding or rebound.  Musculoskeletal:        General: No tenderness. Normal range of motion.     Cervical back: Normal range of motion and neck supple.  Skin:    General: Skin is warm.  Neurological:     Mental Status: She is alert  and oriented to person, place, and time.  Psychiatric:        Mood and Affect: Affect normal.      LABORATORY DATA:  I have reviewed the data as listed    Component Value Date/Time   NA 138 06/05/2022 1357   K 4.4 06/05/2022 1357   CL 107 06/05/2022 1357   CO2 25 06/05/2022 1357   GLUCOSE 124 (H) 06/05/2022 1357   BUN 33 (H) 06/05/2022 1357   CREATININE 1.11 (H) 06/05/2022 1357   CALCIUM 8.9 06/05/2022 1357   PROT  6.8 06/05/2022 1357   ALBUMIN 3.9 06/05/2022 1357   AST 24 06/05/2022 1357   ALT 21 06/05/2022 1357   ALKPHOS 75 06/05/2022 1357   BILITOT 0.6 06/05/2022 1357   GFRNONAA 45 (L) 06/05/2022 1357   GFRAA 50 (L) 06/05/2020 1435    No results found for: "SPEP", "UPEP"  Lab Results  Component Value Date   WBC 5.5 06/05/2022   NEUTROABS 3.8 06/05/2022   HGB 11.7 (L) 06/05/2022   HCT 35.8 (L) 06/05/2022   MCV 93.5 06/05/2022   PLT 159 06/05/2022      Chemistry      Component Value Date/Time   NA 138 06/05/2022 1357   K 4.4 06/05/2022 1357   CL 107 06/05/2022 1357   CO2 25 06/05/2022 1357   BUN 33 (H) 06/05/2022 1357   CREATININE 1.11 (H) 06/05/2022 1357      Component Value Date/Time   CALCIUM 8.9 06/05/2022 1357   ALKPHOS 75 06/05/2022 1357   AST 24 06/05/2022 1357   ALT 21 06/05/2022 1357   BILITOT 0.6 06/05/2022 1357       RADIOGRAPHIC STUDIES: I have personally reviewed the radiological images as listed and agreed with the findings in the report. No results found.   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 last Rituxan- June 2018; PET- DEC 2019  no evidence of lymphoma noted; however mild continued right hilar uptake [likely reactive].  STABLE.  Continue surveillance on a clinical basis.  # Right LE DVT/ History of PE [March 2017]- OFF xarelto- AUg 2022- STABLE.   # Mild Anemia-/IDA- Improved on PO iron. Continue p.o.  iron.STABLE  # CKD- stage III- STABLE GFR-45. STABLE; avoid nephrotoxic- ok with tylenol prn.    #DISPOSITION:  # follow up in 6 months-MD- /labs-cbc/cmp- Dr.B      Orders Placed This Encounter  Procedures   CBC with Differential/Platelet    Standing Status:   Future    Standing Expiration Date:   06/06/2023   Comprehensive metabolic panel    Standing Status:   Future    Standing Expiration Date:   06/06/2023   All questions were answered. The patient knows to call the clinic with any problems, questions or concerns.      Cammie Sickle, MD 06/05/2022 3:11 PM

## 2022-06-05 NOTE — Assessment & Plan Note (Addendum)
Diffuse large B-cell lymphoma s/p Rituxan-  Clinically no evidence of progression noted. last Rituxan- June 2018; PET- DEC 2019  no evidence of lymphoma noted; however mild continued right hilar uptake [likely reactive].  STABLE.  Continue surveillance on a clinical basis.  # Right LE DVT/ History of PE [March 2017]- OFF xarelto- AUg 2022- STABLE.   # Mild Anemia-/IDA- Improved on PO iron. Continue p.o. iron.STABLE  # CKD- stage III- STABLE GFR-45. STABLE; avoid nephrotoxic- ok with tylenol prn.    #DISPOSITION:  # follow up in 6 months-MD- /labs-cbc/cmp- Dr.B

## 2022-06-05 NOTE — Progress Notes (Signed)
Patient denies new problems/concerns today.   Patient has episodes of dizziness and had a fall about 1 month ago.

## 2022-06-10 ENCOUNTER — Other Ambulatory Visit: Payer: Self-pay | Admitting: Nurse Practitioner

## 2022-06-12 ENCOUNTER — Other Ambulatory Visit: Payer: Self-pay | Admitting: Nurse Practitioner

## 2022-06-24 ENCOUNTER — Other Ambulatory Visit: Payer: Self-pay | Admitting: Nurse Practitioner

## 2022-08-03 DIAGNOSIS — H353131 Nonexudative age-related macular degeneration, bilateral, early dry stage: Secondary | ICD-10-CM | POA: Diagnosis not present

## 2022-08-03 DIAGNOSIS — H401133 Primary open-angle glaucoma, bilateral, severe stage: Secondary | ICD-10-CM | POA: Diagnosis not present

## 2022-09-14 ENCOUNTER — Encounter: Payer: Self-pay | Admitting: Family Medicine

## 2022-09-14 ENCOUNTER — Ambulatory Visit (INDEPENDENT_AMBULATORY_CARE_PROVIDER_SITE_OTHER): Payer: PPO | Admitting: Family Medicine

## 2022-09-14 VITALS — BP 104/70 | HR 68 | Temp 97.8°F | Ht 66.0 in | Wt 111.0 lb

## 2022-09-14 DIAGNOSIS — F320 Major depressive disorder, single episode, mild: Secondary | ICD-10-CM

## 2022-09-14 DIAGNOSIS — I951 Orthostatic hypotension: Secondary | ICD-10-CM

## 2022-09-14 DIAGNOSIS — R413 Other amnesia: Secondary | ICD-10-CM

## 2022-09-14 NOTE — Assessment & Plan Note (Signed)
Possibly with some depression.  Discussed potential treatment with an antidepressant though the patient's daughter is a little hesitant given that the patient has sensitivities to a lot of medications and has had lots of adverse reactions in the past other medications.  They were given a list of possible SSRIs that we could try.  Discussed that improving depression may help improve her memory.

## 2022-09-14 NOTE — Assessment & Plan Note (Signed)
Continues to have memory issues.  Discussed the potential for medication to help with this though we defer this at this time due to side effects with other medications in the past.  They will monitor.

## 2022-09-14 NOTE — Progress Notes (Signed)
Lisa Rumps, MD Phone: [phone]  Lisa Sosa Audi is a 88 y.o. female who presents today for follow-up.  Orthostasis: Continues to intermittently get lightheaded at times.  No syncope.  Her daughter notes she is not drinking enough.  Occasionally she will have Gatorade or tomato juice.  Depression: Patient's daughter reports the patient seems depressed.  Patient denies depression.  Patient's daughter notes she seems less interested in things she used to be interested in.  She just sits there and does not do much.  Her daughter notes her memory is not good.  She does not have good short-term memory.  Patient's daughter also reports she is a much pickier eater than she used to be.  Social History   Tobacco Use  Smoking Status Never  Smokeless Tobacco Never    Current Outpatient Medications on File Prior to Visit  Medication Sig Dispense Refill   brimonidine (ALPHAGAN) 0.2 % ophthalmic solution Place 1 drop into both eyes 2 (two) times daily.     dorzolamide-timolol (COSOPT) 22.3-6.8 MG/ML ophthalmic solution 1 drop 2 (two) times daily.     FeFum-FePo-FA-B Cmp-C-Zn-Mn-Cu (SE-TAN PLUS) 162-115.2-1 MG CAPS TAKE 1 CAPSULE BY MOUTH EVERY DAY 30 capsule 4   latanoprost (XALATAN) 0.005 % ophthalmic solution Place 1 drop into both eyes at bedtime.     No current facility-administered medications on file prior to visit.     ROS see history of present illness  Objective  Physical Exam Vitals:   09/14/22 1422  BP: 104/70  Pulse: 68  Temp: 97.8 F (36.6 C)  SpO2: 99%    BP Readings from Last 3 Encounters:  09/14/22 104/70  06/05/22 (!) 118/56  03/13/22 110/60   Wt Readings from Last 3 Encounters:  09/14/22 111 lb (50.3 kg)  06/05/22 106 lb (48.1 kg)  03/13/22 110 lb 3.2 oz (50 kg)    Physical Exam Constitutional:      General: She is not in acute distress.    Appearance: She is not diaphoretic.  Cardiovascular:     Rate and Rhythm: Normal rate and regular rhythm.      Heart sounds: Normal heart sounds.  Pulmonary:     Effort: Pulmonary effort is normal.     Breath sounds: Normal breath sounds.  Skin:    General: Skin is warm and dry.  Neurological:     Mental Status: She is alert.      Assessment/Plan: Please see individual problem list.  Problem List Items Addressed This Visit     Memory difficulty (Chronic)    Continues to have memory issues.  Discussed the potential for medication to help with this though we defer this at this time due to side effects with other medications in the past.  They will monitor.      Orthostasis - Primary (Chronic)    Discussed adequate hydration.  Encouraged increasing water intake though advised she could also take in noncaffeinated and nonalcoholic beverages as well.      Depression, major, single episode, mild ([place])    Possibly with some depression.  Discussed potential treatment with an antidepressant though the patient's daughter is a little hesitant given that the patient has sensitivities to a lot of medications and has had lots of adverse reactions in the past other medications.  They were given a list of possible SSRIs that we could try.  Discussed that improving depression may help improve her memory.        Return in about 1 year (around 09/15/2023).  Lisa Rumps, MD [place]

## 2022-09-14 NOTE — Assessment & Plan Note (Signed)
Discussed adequate hydration.  Encouraged increasing water intake though advised she could also take in noncaffeinated and nonalcoholic beverages as well.

## 2022-09-14 NOTE — Patient Instructions (Signed)
Please let me know if you would like to try any of the following medications for depression: Zoloft, Lexapro, Celexa, or Prozac.

## 2022-09-29 DIAGNOSIS — Z66 Do not resuscitate: Secondary | ICD-10-CM | POA: Diagnosis not present

## 2022-09-29 DIAGNOSIS — N1831 Chronic kidney disease, stage 3a: Secondary | ICD-10-CM | POA: Diagnosis not present

## 2022-09-29 DIAGNOSIS — F0280 Dementia in other diseases classified elsewhere without behavioral disturbance: Secondary | ICD-10-CM | POA: Diagnosis not present

## 2022-09-29 DIAGNOSIS — Z515 Encounter for palliative care: Secondary | ICD-10-CM | POA: Diagnosis not present

## 2022-12-07 ENCOUNTER — Other Ambulatory Visit: Payer: PPO

## 2022-12-07 ENCOUNTER — Ambulatory Visit: Payer: PPO | Admitting: Internal Medicine

## 2022-12-21 ENCOUNTER — Inpatient Hospital Stay (HOSPITAL_BASED_OUTPATIENT_CLINIC_OR_DEPARTMENT_OTHER): Payer: PPO | Admitting: Internal Medicine

## 2022-12-21 ENCOUNTER — Encounter: Payer: Self-pay | Admitting: Internal Medicine

## 2022-12-21 ENCOUNTER — Inpatient Hospital Stay: Payer: PPO | Attending: Internal Medicine

## 2022-12-21 VITALS — BP 162/79 | HR 73 | Temp 97.4°F | Resp 16 | Wt 113.7 lb

## 2022-12-21 DIAGNOSIS — Z8551 Personal history of malignant neoplasm of bladder: Secondary | ICD-10-CM | POA: Diagnosis not present

## 2022-12-21 DIAGNOSIS — C8581 Other specified types of non-Hodgkin lymphoma, lymph nodes of head, face, and neck: Secondary | ICD-10-CM

## 2022-12-21 DIAGNOSIS — Z86718 Personal history of other venous thrombosis and embolism: Secondary | ICD-10-CM | POA: Insufficient documentation

## 2022-12-21 DIAGNOSIS — Z8041 Family history of malignant neoplasm of ovary: Secondary | ICD-10-CM | POA: Diagnosis not present

## 2022-12-21 DIAGNOSIS — D509 Iron deficiency anemia, unspecified: Secondary | ICD-10-CM | POA: Insufficient documentation

## 2022-12-21 DIAGNOSIS — Z86711 Personal history of pulmonary embolism: Secondary | ICD-10-CM | POA: Insufficient documentation

## 2022-12-21 DIAGNOSIS — C8331 Diffuse large B-cell lymphoma, lymph nodes of head, face, and neck: Secondary | ICD-10-CM | POA: Diagnosis not present

## 2022-12-21 DIAGNOSIS — Z8042 Family history of malignant neoplasm of prostate: Secondary | ICD-10-CM | POA: Insufficient documentation

## 2022-12-21 DIAGNOSIS — Z801 Family history of malignant neoplasm of trachea, bronchus and lung: Secondary | ICD-10-CM | POA: Diagnosis not present

## 2022-12-21 DIAGNOSIS — N183 Chronic kidney disease, stage 3 unspecified: Secondary | ICD-10-CM | POA: Diagnosis not present

## 2022-12-21 LAB — CBC WITH DIFFERENTIAL/PLATELET
Abs Immature Granulocytes: 0.02 10*3/uL (ref 0.00–0.07)
Basophils Absolute: 0 10*3/uL (ref 0.0–0.1)
Basophils Relative: 0 %
Eosinophils Absolute: 0.1 10*3/uL (ref 0.0–0.5)
Eosinophils Relative: 1 %
HCT: 40.2 % (ref 36.0–46.0)
Hemoglobin: 12.9 g/dL (ref 12.0–15.0)
Immature Granulocytes: 0 %
Lymphocytes Relative: 12 %
Lymphs Abs: 0.7 10*3/uL (ref 0.7–4.0)
MCH: 30.3 pg (ref 26.0–34.0)
MCHC: 32.1 g/dL (ref 30.0–36.0)
MCV: 94.4 fL (ref 80.0–100.0)
Monocytes Absolute: 0.5 10*3/uL (ref 0.1–1.0)
Monocytes Relative: 7 %
Neutro Abs: 5.1 10*3/uL (ref 1.7–7.7)
Neutrophils Relative %: 80 %
Platelets: 174 10*3/uL (ref 150–400)
RBC: 4.26 MIL/uL (ref 3.87–5.11)
RDW: 13.8 % (ref 11.5–15.5)
WBC: 6.4 10*3/uL (ref 4.0–10.5)
nRBC: 0 % (ref 0.0–0.2)

## 2022-12-21 LAB — COMPREHENSIVE METABOLIC PANEL
ALT: 17 U/L (ref 0–44)
AST: 20 U/L (ref 15–41)
Albumin: 4.2 g/dL (ref 3.5–5.0)
Alkaline Phosphatase: 82 U/L (ref 38–126)
Anion gap: 5 (ref 5–15)
BUN: 35 mg/dL — ABNORMAL HIGH (ref 8–23)
CO2: 25 mmol/L (ref 22–32)
Calcium: 9.3 mg/dL (ref 8.9–10.3)
Chloride: 108 mmol/L (ref 98–111)
Creatinine, Ser: 1.2 mg/dL — ABNORMAL HIGH (ref 0.44–1.00)
GFR, Estimated: 41 mL/min — ABNORMAL LOW (ref >60)
Glucose, Bld: 100 mg/dL — ABNORMAL HIGH (ref 70–99)
Potassium: 4.6 mmol/L (ref 3.5–5.1)
Sodium: 138 mmol/L (ref 135–145)
Total Bilirubin: 0.7 mg/dL (ref 0.3–1.2)
Total Protein: 7.7 g/dL (ref 6.5–8.1)

## 2022-12-21 NOTE — Progress Notes (Signed)
Patient denies new problems/concerns today.   °

## 2022-12-21 NOTE — Assessment & Plan Note (Addendum)
Diffuse large B-cell lymphoma s/p Rituxan-  Clinically no evidence of progression noted. last Rituxan- June 2018; PET- DEC 2019  no evidence of lymphoma noted; however mild continued right hilar uptake [likely reactive].  Stable-Continue surveillance on a clinical basis.  # Right LE DVT/ History of PE [March 2017]- OFF xarelto- AUg 2022- Stable- [given falls]  # Mild Anemia-/IDA- Improved on PO iron. Continue p.o. iron.Stable-  # CKD- stage III- STABLE GFR-45. Stable- avoid nephrotoxic- ok with tylenol prn.   #Since patient is clinically stable I think is reasonable for the patient to follow-up with PCP/can follow-up with [place] as needed.  Patient comfortable with the plan; to call us if any questions or concerns in the interim.   #DISPOSITION:  # follow up as needed- Dr.B

## 2022-12-21 NOTE — Progress Notes (Signed)
[place] OFFICE PROGRESS NOTE  Patient Care Team: [name], MD as PCP - General (Family Medicine) Christene Lye, MD as Consulting Physician (General Surgery) System, Provider Not In [place], MD as Consulting Physician (Oncology)   Oncology History Overview Note  58.  89yo- year-old lady presented with large left neck mass and abdominal palpable mass.  Biopsy of left neck mass is being positive for diffuse B large cell lymphoma; Clinically staged as stage III (June, 2016) 2.  Patient did not want any chemotherapy so was started on prednisone followed by Rituxan in June of 2016 with excellent response 3.  Patient has finished 4 weekly cycle of rituximab on April 16, 2015 with excellent response  4.  Because of bulky disease in the past patient is getting maintenance Rituxan therapy started in September of 2016; STOPPED in June 2018.   # R LE DVT- was found to have a deep vein thrombosis on xeralto.  (March, 2017); And right-sided pulmonary emboli;SEP 2022- STOP xarelto [sec to falls- dementia]  # AUG 2017- PET- NED.     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  Accompanied by her daughter.  Hard of hearing  Lisa Sosa 89 y.o.  female pleasant patient above history of Diffuse large B-cell lymphoma/DVT PE currently off Xarelto [Hx of falls]; mild dementia currently on surveillance is here for follow-up.  Denies any new lumps or bumps. Hx of fall- in carpet.  Witnessed.    Review of Systems  Constitutional:  Positive for malaise/fatigue. Negative for chills, diaphoresis, fever and weight loss.  HENT:  Negative for nosebleeds and sore throat.   Eyes:  Negative for double vision.  Respiratory:  Negative for cough, hemoptysis, sputum production, shortness of breath and wheezing.   Cardiovascular:  Negative for chest pain, palpitations, orthopnea and leg swelling.  Gastrointestinal:  Negative for abdominal pain, blood in stool, constipation, diarrhea, heartburn, melena, nausea and vomiting.  Genitourinary:  Negative for dysuria, frequency and urgency.  Musculoskeletal:  Positive for back pain and joint pain.  Skin: Negative.  Negative for itching and rash.  Neurological:  Negative for dizziness, tingling, focal weakness, weakness and headaches.  Endo/Heme/Allergies:  Does not bruise/bleed easily.  Psychiatric/Behavioral:  Negative for depression. The patient is not nervous/anxious and does not have insomnia.    PAST MEDICAL HISTORY :  Past Medical History:  Diagnosis Date   Cancer ([place]) 10/12/2009   gallbladder   Chickenpox    COVID-19    10/07/21   Diffuse large B cell lymphoma ([place]) 03/17/2015   Elevated blood pressure    Glaucoma    History of blood transfusion     PAST SURGICAL HISTORY :   Past Surgical History:  Procedure Laterality Date   ABDOMINAL HYSTERECTOMY  1978   For fibroids   BREAST BIOPSY Right 1990   TRANSURETHRAL RESECTION OF BLADDER TUMOR  2011     FAMILY HISTORY :   Family History  Problem Relation Age  of Onset   Alcoholism Other    Cancer - Lung Other        Parent   Cancer - Ovarian Mother    Prostate cancer Other        Other relative   Stroke Other        Other relative    High blood pressure Other        Other relative   Diabetes Other        Other relative    SOCIAL HISTORY:   Social History   Tobacco Use   Smoking status: Never   Smokeless tobacco: Never  Substance Use Topics   Alcohol use: No     Alcohol/week: 0.0 standard drinks of alcohol   Drug use: No    ALLERGIES:  is allergic to iron and codeine.  MEDICATIONS:  Current Outpatient Medications  Medication Sig Dispense Refill   brimonidine (ALPHAGAN) 0.2 % ophthalmic solution Place 1 drop into both eyes 2 (two) times daily.     dorzolamide-timolol (COSOPT) 22.3-6.8 MG/ML ophthalmic solution 1 drop 2 (two) times daily.     FeFum-FePo-FA-B Cmp-C-Zn-Mn-Cu (SE-TAN PLUS) 162-115.2-1 MG CAPS TAKE 1 CAPSULE BY MOUTH EVERY DAY 30 capsule 4   latanoprost (XALATAN) 0.005 % ophthalmic solution Place 1 drop into both eyes at bedtime.     No current facility-administered medications for this visit.    PHYSICAL EXAMINATION: ECOG PERFORMANCE STATUS: 2 - Symptomatic, <50% confined to bed  BP (!) 162/79 (BP Location: Left Arm, Patient Position: Sitting)   Pulse 73   Temp (!) 97.4 F (36.3 C) (Tympanic)   Resp 16   Wt 113 lb 11.2 oz (51.6 kg)   BMI 18.35 kg/m   Filed Weights   12/21/22 1500  Weight: 113 lb 11.2 oz (51.6 kg)   Frail-appearing Caucasian female patient.  She is accompanied by daughter.  She is walking with a rolling walker.  Physical Exam HENT:     Head: Normocephalic and atraumatic.     Mouth/Throat:     Pharynx: No oropharyngeal exudate.  Eyes:     Pupils: Pupils are equal, round, and reactive to light.  Cardiovascular:     Rate and Rhythm: Normal rate and regular rhythm.  Pulmonary:     Effort: No respiratory distress.     Breath sounds: No wheezing.  Abdominal:     General: Bowel sounds are normal. There is no distension.     Palpations: Abdomen is soft. There is no mass.     Tenderness: There is no abdominal tenderness. There is no guarding or rebound.  Musculoskeletal:        General: No tenderness. Normal range of motion.     Cervical back: Normal range of motion and neck supple.  Skin:    General: Skin is warm.  Neurological:     Mental Status: She is alert and oriented to person, place, and  time.  Psychiatric:        Mood and Affect: Affect normal.      LABORATORY DATA:  I have reviewed the data as listed    Component Value Date/Time   NA 138 12/21/2022 1516   K 4.6 12/21/2022 1516   CL 108 12/21/2022 1516   CO2 25 12/21/2022 1516   GLUCOSE 100 (H) 12/21/2022 1516   BUN 35 (H) 12/21/2022 1516   CREATININE 1.20 (H) 12/21/2022 1516   CALCIUM 9.3 12/21/2022 1516   PROT 7.7 12/21/2022 1516   ALBUMIN 4.2 12/21/2022 1516  AST 20 12/21/2022 1516   ALT 17 12/21/2022 1516   ALKPHOS 82 12/21/2022 1516   BILITOT 0.7 12/21/2022 1516   GFRNONAA 41 (L) 12/21/2022 1516   GFRAA 50 (L) 06/05/2020 1435    No results found for: "SPEP", "UPEP"  Lab Results  Component Value Date   WBC 6.4 12/21/2022   NEUTROABS 5.1 12/21/2022   HGB 12.9 12/21/2022   HCT 40.2 12/21/2022   MCV 94.4 12/21/2022   PLT 174 12/21/2022      Chemistry      Component Value Date/Time   NA 138 12/21/2022 1516   K 4.6 12/21/2022 1516   CL 108 12/21/2022 1516   CO2 25 12/21/2022 1516   BUN 35 (H) 12/21/2022 1516   CREATININE 1.20 (H) 12/21/2022 1516      Component Value Date/Time   CALCIUM 9.3 12/21/2022 1516   ALKPHOS 82 12/21/2022 1516   AST 20 12/21/2022 1516   ALT 17 12/21/2022 1516   BILITOT 0.7 12/21/2022 1516       RADIOGRAPHIC STUDIES: I have personally reviewed the radiological images as listed and agreed with the findings in the report. No results found.   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 last Rituxan- June 2018; PET- DEC 2019  no evidence of lymphoma noted; however mild continued right hilar uptake [likely reactive].  Stable-Continue surveillance on a clinical basis.  # Right LE DVT/ History of PE [March 2017]- OFF xarelto- AUg 2022- Stable- [given falls]  # Mild Anemia-/IDA- Improved on PO iron. Continue p.o. iron.Stable-  # CKD- stage III-  STABLE GFR-45. Stable- avoid nephrotoxic- ok with tylenol prn.   #Since patient is clinically stable I think is reasonable for the patient to follow-up with PCP/can follow-up with [place] as needed.  Patient comfortable with the plan; to call us if any questions or concerns in the interim.   #DISPOSITION:  # follow up in 6 months-MD- /labs-cbc/cmp- Dr.B  No orders of the defined types were placed in this encounter.  All questions were answered. The patient knows to call the clinic with any problems, questions or concerns.     Cammie Sickle, MD 12/21/2022 4:07 PM

## 2023-01-20 ENCOUNTER — Telehealth: Payer: Self-pay | Admitting: Family Medicine

## 2023-01-20 NOTE — Telephone Encounter (Signed)
Copied from CRM [id number]. Topic: Medicare AWV >> Jan 20, 2023 10:21 AM Rushie Goltz wrote: Reason for CRM: Called patient to schedule Medicare Annual Wellness Visit (AWV). Left message for patient to call back and schedule Medicare Annual Wellness Visit (AWV).  Last date of AWV:  AWVI eligible as of 10/12/2009  Please schedule an AWVI appointment at any time with [facility] VISIT.  If any questions, please contact me at [phone].   Thank you,  [facility] Support [facility] Direct dial  [phone]

## 2023-01-22 ENCOUNTER — Telehealth: Payer: Self-pay | Admitting: Family Medicine

## 2023-01-22 NOTE — Telephone Encounter (Signed)
Contacted Lisa Sosa to schedule their annual wellness visit. Appointment made for 01/28/2023.  Thank you,  [facility] Support [facility] Direct dial  [phone]

## 2023-01-28 ENCOUNTER — Ambulatory Visit (INDEPENDENT_AMBULATORY_CARE_PROVIDER_SITE_OTHER): Payer: PPO

## 2023-01-28 VITALS — Ht 66.0 in | Wt 113.0 lb

## 2023-01-28 DIAGNOSIS — Z0000 Encounter for general adult medical examination without abnormal findings: Secondary | ICD-10-CM | POA: Diagnosis not present

## 2023-01-28 NOTE — Addendum Note (Signed)
Addended by: Cathey Endow on: 01/28/2023 03:38 PM   Modules accepted: Level of Service

## 2023-01-28 NOTE — Progress Notes (Addendum)
Subjective:   Lisa Sosa is a 89 y.o. female who presents for Medicare Annual (Initial) preventive examination.  Review of Systems    No ROS.  Medicare Wellness Virtual Visit.  Visual/audio telehealth visit, UTA vital signs.   See social history for additional risk factors.   Cardiac Risk Factors include: advanced age (>12men, >10 women)     Objective:    Today's Vitals   01/28/23 1338  Weight: 113 lb (51.3 kg)  Height: 5\' 6"  (1.676 m)   Body mass index is 18.24 kg/m.     01/28/2023    1:40 PM 12/21/2022    3:19 PM 06/05/2022    2:22 PM 12/05/2021    2:28 PM 10/07/2021    7:43 PM 06/06/2021    1:38 PM 12/06/2020    1:26 PM  Advanced Directives  Does Patient Have a Medical Advance Directive? Yes Yes Yes Yes Yes Yes Yes  Type of Estate agent of [place];Living will Healthcare Power of [place];Living will Living will;Healthcare Power of [employer] Power of [place];Living will Healthcare Power of [place];Living will Healthcare Power of [place];Living will Healthcare Power of [place];Living will  Does patient want to make changes to medical advance directive? No - Patient declined    No - Patient declined Yes (ED - Information included in AVS)   Copy of Healthcare Power of Attorney in Chart? Yes - validated most recent copy scanned in chart (See row information) Yes - validated most recent copy scanned in chart (See row information)   No - copy requested  Yes - validated most recent copy scanned in chart (See row information)    Current Medications (verified) Outpatient Encounter Medications as of 01/28/2023  Medication Sig   brimonidine (ALPHAGAN) 0.2 % ophthalmic solution Place 1 drop into both eyes 2 (two) times daily.   dorzolamide-timolol (COSOPT) 22.3-6.8 MG/ML ophthalmic solution 1 drop 2 (two) times daily.   FeFum-FePo-FA-B Cmp-C-Zn-Mn-Cu (SE-TAN PLUS) 162-115.2-1 MG CAPS TAKE 1 CAPSULE BY MOUTH EVERY DAY   latanoprost (XALATAN) 0.005  % ophthalmic solution Place 1 drop into both eyes at bedtime.   No facility-administered encounter medications on file as of 01/28/2023.    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   Elevated blood pressure    Glaucoma    History of blood transfusion    Past Surgical History:  Procedure Laterality Date   ABDOMINAL HYSTERECTOMY  1978   For fibroids   BREAST BIOPSY Right 1990   TRANSURETHRAL RESECTION OF BLADDER TUMOR  2011    Family History  Problem Relation Age of Onset   Alcoholism Other    Cancer - Lung Other        Parent   Cancer - Ovarian Mother    Prostate cancer Other        Other relative   Stroke Other        Other relative    High blood pressure Other        Other relative   Diabetes Other        Other relative   Social History   Socioeconomic History   Marital status: Widowed    Spouse name: Not on file   Number of children: Not on file   Years of education: Not on file   Highest education  level: Not on file  Occupational History   Not on file  Tobacco Use   Smoking status: Never   Smokeless tobacco: Never  Substance and Sexual Activity   Alcohol use: No    Alcohol/week: 0.0 standard drinks of alcohol   Drug use: No   Sexual activity: Not on file  Other Topics Concern   Not on file  Social History Narrative   Not on file   Social Determinants of Health   Financial Resource Strain: Low Risk  (01/28/2023)   Overall Financial Resource Strain (CARDIA)    Difficulty of Paying Living Expenses: Not hard at all  Food Insecurity: No Food Insecurity (01/28/2023)   Hunger Vital Sign    Worried About Running Out of Food in the Last Year: Never true    Ran Out of Food in the Last Year: Never true  Transportation Needs: No Transportation Needs (01/28/2023)   PRAPARE - Doctor, general practice (Medical): No    Lack of Transportation (Non-Medical): No  Physical Activity: Not on file  Stress: No Stress Concern Present (01/28/2023)   [employer] of Occupational Health - Occupational Stress Questionnaire    Feeling of Stress : Not at all  Social Connections: Unknown (01/28/2023)   Social Connection and Isolation Panel [NHANES]    Frequency of Communication with Friends and Family: Not on file    Frequency of Social Gatherings with Friends and Family: More than three times a week    Attends Religious Services: Not on Marketing executive or Organizations: Not on file    Attends Banker Meetings: Not on file    Marital Status: Not on file    Tobacco Counseling Counseling given: Not Answered   Clinical Intake:  Pre-visit preparation completed: Yes           How often do you need to have someone help you when you read instructions, pamphlets, or other written materials from your doctor or pharmacy?: 4 - Often    Interpreter Needed?: No      Activities of Daily Living    01/28/2023    1:44 PM  In your present state of health, do you have any difficulty performing the following activities:  Hearing? 0  Vision? 0  Difficulty concentrating or making decisions? 1  Comment age appropriate  Walking or climbing stairs? 1  Dressing or bathing? 1  Doing errands, shopping? 1  Preparing Food and eating ? N  Using the Toilet? N  In the past six months, have you accidently leaked urine? Y  Comment Wears brief  Do you have problems with loss of bowel control? N  Managing your Medications? Y  Managing your Finances? Y  Housekeeping or managing your Housekeeping? Y    Patient Care Team: Glori Luis, MD as PCP - General (Family Medicine) Kieth Brightly, MD as Consulting Physician (General Surgery) System, Provider Not In Earna Coder, MD as Consulting Physician (Oncology)  Indicate any recent  Medical Services you may have received from other than [facility] providers in the past year (date may be approximate).     Assessment:   This is a routine wellness examination for [employer].  I connected with  Laveda Abbe on 01/28/23 by a audio enabled telemedicine application and verified that I am speaking with the correct person using two identifiers. Information also received from daughter, (HIPAA compliant).  Patient Location: Home  Provider Location: Office/Clinic  I  discussed the limitations of evaluation and management by telemedicine. The patient expressed understanding and agreed to proceed.   Hearing/Vision screen Hearing Screening - Comments:: No hearing changes. No hearing aids.  Vision Screening - Comments:: Followed by [facility] Wears corrective lenses They have seen their ophthalmologist in the last 12 months.    Dietary issues and exercise activities discussed: Current Exercise Habits: The patient has a physically strenuous job, but has no regular exercise apart from work., Intensity: Mild Regular diet   Goals Addressed             This Visit's Progress    DIET - INCREASE WATER INTAKE       Stay hydrated       Depression Screen    01/28/2023    1:43 PM 09/14/2022    2:31 PM 03/13/2022    2:36 PM 12/10/2021   12:21 PM 09/19/2021   11:12 AM 11/27/2020   11:44 AM 02/10/2017    2:15 PM  PHQ 2/9 Scores  PHQ - 2 Score 1 5 0 0 0 0 3  PHQ- 9 Score       8    Fall Risk    01/28/2023    1:44 PM 09/14/2022    2:31 PM 03/13/2022    2:35 PM 11/27/2020   11:44 AM 03/23/2018   11:27 AM  Fall Risk   Falls in the past year? 1 1 0 1 No  Number falls in past yr: 1 1 0 0   Injury with Fall? 0 0 0 0   Risk for fall due to :  History of fall(s) No Fall Risks    Follow up Falls evaluation completed;Falls prevention discussed Falls evaluation completed Falls evaluation completed Falls evaluation completed     FALL RISK PREVENTION PERTAINING TO THE HOME: Home free  of loose throw rugs in walkways, pet beds, electrical cords, etc? Yes  Adequate lighting in your home to reduce risk of falls? Yes   ASSISTIVE DEVICES UTILIZED TO PREVENT FALLS: Life alert? No  Use of a cane, walker or w/c? Yes   TIMED UP AND GO: Was the test performed? No .    Cognitive Function:  Patient is alert.       01/28/2023    3:34 PM  6CIT Screen  What time? 0 points    Immunizations Immunization History  Administered Date(s) Administered   Influenza, High Dose Seasonal PF 08/16/2017   Influenza,inj,Quad PF,6+ Mos 08/19/2015, 08/03/2016   Moderna Sars-Covid-2 Vaccination 10/23/2019, 11/23/2019, 07/10/2022   PPD Test 03/23/2018   Pfizer Covid-19 Vaccine Bivalent Booster 57yrs & up 11/06/2020   TDAP status: Due, Education has been provided regarding the importance of this vaccine. Advised may receive this vaccine at local pharmacy or Health Dept. Aware to provide a copy of the vaccination record if obtained from local pharmacy or Health Dept. Verbalized acceptance and understanding.Declined.   Pneumococcal vaccine status: Declined,  Education has been provided regarding the importance of this vaccine but patient still declined. Advised may receive this vaccine at local pharmacy or Health Dept. Aware to provide a copy of the vaccination record if obtained from local pharmacy or Health Dept. Verbalized acceptance and understanding.   Covid-19 vaccine status: Completed vaccines x4.  Shingrix Completed?: No.    Education has been provided regarding the importance of this vaccine. Patient has been advised to call insurance company to determine out of pocket expense if they have not yet received this vaccine. Advised may also receive  vaccine at local pharmacy or Health Dept. Verbalized acceptance and understanding. Declined.    Screening Tests Health Maintenance  Topic Date Due   DTaP/Tdap/Td (1 - Tdap) Never done   COVID-19 Vaccine (5 - 2023-24 season) 02/13/2023  (Originally 09/04/2022)   Zoster Vaccines- Shingrix (1 of 2) 04/29/2023 (Originally 04/11/1942)   Pneumonia Vaccine 9+ Years old (1 of 2 - PCV) 01/28/2024 (Originally 04/12/1929)   DEXA SCAN  01/28/2024 (Originally 04/11/1990)   INFLUENZA VACCINE  05/13/2023   Medicare Annual Wellness (AWV)  01/28/2024   HPV VACCINES  Aged Out    Health Maintenance Health Maintenance Due  Topic Date Due   DTaP/Tdap/Td (1 - Tdap) Never done   Bone density- declined.   Lung Cancer Screening: (Low Dose CT Chest recommended if Age 52-80 years, 30 pack-year currently smoking OR have quit w/in 15years.) does not qualify.   Hepatitis C Screening: does not qualify.  Vision Screening: Recommended annual ophthalmology exams for early detection of glaucoma and other disorders of the eye.  Dental Screening: Recommended annual dental exams for proper oral hygiene  Community Resource Referral / Chronic Care Management: CRR required this visit?  No   CCM required this visit?  No      Plan:     I have personally reviewed and noted the following in the patient's chart:   Medical and social history Use of alcohol, tobacco or illicit drugs  Current medications and supplements including opioid prescriptions. Patient is not currently taking opioid prescriptions. Functional ability and status Nutritional status Physical activity Advanced directives List of other physicians Hospitalizations, surgeries, and ER visits in previous 12 months Vitals Screenings to include cognitive, depression, and falls Referrals and appointments  In addition, I have reviewed and discussed with patient certain preventive protocols, quality metrics, and best practice recommendations. A written personalized care plan for preventive services as well as general preventive health recommendations were provided to patient.     Cathey Endow, LPN   1/61/0960

## 2023-01-28 NOTE — Patient Instructions (Addendum)
Lisa Sosa , Thank you for taking time to come for your Medicare Wellness Visit. I appreciate your ongoing commitment to your health goals. Please review the following plan we discussed and let me know if I can assist you in the future.   These are the goals we discussed:  Goals      DIET - INCREASE WATER INTAKE     Stay hydrated        This is a list of the screening recommended for you and due dates:  Health Maintenance  Topic Date Due   DTaP/Tdap/Td vaccine (1 - Tdap) Never done   COVID-19 Vaccine (5 - 2023-24 season) 02/13/2023*   Zoster (Shingles) Vaccine (1 of 2) 04/29/2023*   Pneumonia Vaccine (1 of 2 - PCV) 01/28/2024*   DEXA scan (bone density measurement)  01/28/2024*   Flu Shot  05/13/2023   Medicare Annual Wellness Visit  01/28/2024   HPV Vaccine  Aged Out  *Topic was postponed. The date shown is not the original due date.    Advanced directives: On file  Conditions/risks identified: none new  Next appointment: Follow up in one year for your annual wellness visit    Preventive Care 65 Years and Older, Female Preventive care refers to lifestyle choices and visits with your health care provider that can promote health and wellness. What does preventive care include? A yearly physical exam. This is also called an annual well check. Dental exams once or twice a year. Routine eye exams. Ask your health care provider how often you should have your eyes checked. Personal lifestyle choices, including: Daily care of your teeth and gums. Regular physical activity. Eating a healthy diet. Avoiding tobacco and drug use. Limiting alcohol use. Practicing safe sex. Taking low-dose aspirin every day. Taking vitamin and mineral supplements as recommended by your health care provider. What happens during an annual well check? The services and screenings done by your health care provider during your annual well check will depend on your age, overall health, lifestyle risk  factors, and family history of disease. Counseling  Your health care provider may ask you questions about your: Alcohol use. Tobacco use. Drug use. Emotional well-being. Home and relationship well-being. Sexual activity. Eating habits. History of falls. Memory and ability to understand (cognition). Work and work Astronomer. Reproductive health. Screening  You may have the following tests or measurements: Height, weight, and BMI. Blood pressure. Lipid and cholesterol levels. These may be checked every 5 years, or more frequently if you are over 11 years old. Skin check. Lung cancer screening. You may have this screening every year starting at age 30 if you have a 30-pack-year history of smoking and currently smoke or have quit within the past 15 years. Fecal occult blood test (FOBT) of the stool. You may have this test every year starting at age 63. Flexible sigmoidoscopy or colonoscopy. You may have a sigmoidoscopy every 5 years or a colonoscopy every 10 years starting at age 52. Hepatitis C blood test. Hepatitis B blood test. Sexually transmitted disease (STD) testing. Diabetes screening. This is done by checking your blood sugar (glucose) after you have not eaten for a while (fasting). You may have this done every 1-3 years. Bone density scan. This is done to screen for osteoporosis. You may have this done starting at age 70. Mammogram. This may be done every 1-2 years. Talk to your health care provider about how often you should have regular mammograms. Talk with your health care provider about your  test results, treatment options, and if necessary, the need for more tests. Vaccines  Your health care provider may recommend certain vaccines, such as: Influenza vaccine. This is recommended every year. Tetanus, diphtheria, and acellular pertussis (Tdap, Td) vaccine. You may need a Td booster every 10 years. Zoster vaccine. You may need this after age 56. Pneumococcal 13-valent  conjugate (PCV13) vaccine. One dose is recommended after age 27. Pneumococcal polysaccharide (PPSV23) vaccine. One dose is recommended after age 59. Talk to your health care provider about which screenings and vaccines you need and how often you need them. This information is not intended to replace advice given to you by your health care provider. Make sure you discuss any questions you have with your health care provider. Document Released: 10/25/2015 Document Revised: 06/17/2016 Document Reviewed: 07/30/2015 Elsevier Interactive Patient Education  2017 [place] Prevention in the Home Falls can cause injuries. They can happen to people of all ages. There are many things you can do to make your home safe and to help prevent falls. What can I do on the outside of my home? Regularly fix the edges of walkways and driveways and fix any cracks. Remove anything that might make you trip as you walk through a door, such as a raised step or threshold. Trim any bushes or trees on the path to your home. Use bright outdoor lighting. Clear any walking paths of anything that might make someone trip, such as rocks or tools. Regularly check to see if handrails are loose or broken. Make sure that both sides of any steps have handrails. Any raised decks and porches should have guardrails on the edges. Have any leaves, snow, or ice cleared regularly. Use sand or salt on walking paths during winter. Clean up any spills in your garage right away. This includes oil or grease spills. What can I do in the bathroom? Use night lights. Install grab bars by the toilet and in the tub and shower. Do not use towel bars as grab bars. Use non-skid mats or decals in the tub or shower. If you need to sit down in the shower, use a plastic, non-slip stool. Keep the floor dry. Clean up any water that spills on the floor as soon as it happens. Remove soap buildup in the tub or shower regularly. Attach bath mats  securely with double-sided non-slip rug tape. Do not have throw rugs and other things on the floor that can make you trip. What can I do in the bedroom? Use night lights. Make sure that you have a light by your bed that is easy to reach. Do not use any sheets or blankets that are too big for your bed. They should not hang down onto the floor. Have a firm chair that has side arms. You can use this for support while you get dressed. Do not have throw rugs and other things on the floor that can make you trip. What can I do in the kitchen? Clean up any spills right away. Avoid walking on wet floors. Keep items that you use a lot in easy-to-reach places. If you need to reach something above you, use a strong step stool that has a grab bar. Keep electrical cords out of the way. Do not use floor polish or wax that makes floors slippery. If you must use wax, use non-skid floor wax. Do not have throw rugs and other things on the floor that can make you trip. What can I do with my  stairs? Do not leave any items on the stairs. Make sure that there are handrails on both sides of the stairs and use them. Fix handrails that are broken or loose. Make sure that handrails are as long as the stairways. Check any carpeting to make sure that it is firmly attached to the stairs. Fix any carpet that is loose or worn. Avoid having throw rugs at the top or bottom of the stairs. If you do have throw rugs, attach them to the floor with carpet tape. Make sure that you have a light switch at the top of the stairs and the bottom of the stairs. If you do not have them, ask someone to add them for you. What else can I do to help prevent falls? Wear shoes that: Do not have high heels. Have rubber bottoms. Are comfortable and fit you well. Are closed at the toe. Do not wear sandals. If you use a stepladder: Make sure that it is fully opened. Do not climb a closed stepladder. Make sure that both sides of the stepladder  are locked into place. Ask someone to hold it for you, if possible. Clearly mark and make sure that you can see: Any grab bars or handrails. First and last steps. Where the edge of each step is. Use tools that help you move around (mobility aids) if they are needed. These include: Canes. Walkers. Scooters. Crutches. Turn on the lights when you go into a dark area. Replace any light bulbs as soon as they burn out. Set up your furniture so you have a clear path. Avoid moving your furniture around. If any of your floors are uneven, fix them. If there are any pets around you, be aware of where they are. Review your medicines with your doctor. Some medicines can make you feel dizzy. This can increase your chance of falling. Ask your doctor what other things that you can do to help prevent falls. This information is not intended to replace advice given to you by your health care provider. Make sure you discuss any questions you have with your health care provider. Document Released: 07/25/2009 Document Revised: 03/05/2016 Document Reviewed: 11/02/2014 Elsevier Interactive Patient Education  2017 [employer].

## 2023-02-02 DIAGNOSIS — H401133 Primary open-angle glaucoma, bilateral, severe stage: Secondary | ICD-10-CM | POA: Diagnosis not present

## 2023-02-02 DIAGNOSIS — H353132 Nonexudative age-related macular degeneration, bilateral, intermediate dry stage: Secondary | ICD-10-CM | POA: Diagnosis not present

## 2023-07-29 ENCOUNTER — Emergency Department: Payer: PPO

## 2023-07-29 ENCOUNTER — Other Ambulatory Visit: Payer: Self-pay

## 2023-07-29 ENCOUNTER — Encounter: Payer: Self-pay | Admitting: Emergency Medicine

## 2023-07-29 ENCOUNTER — Inpatient Hospital Stay
Admission: EM | Admit: 2023-07-29 | Discharge: 2023-08-05 | DRG: 543 | Disposition: A | Discharge status: Skilled Nursing Facility | Payer: PPO | Attending: Internal Medicine | Admitting: Internal Medicine

## 2023-07-29 DIAGNOSIS — S199XXA Unspecified injury of neck, initial encounter: Secondary | ICD-10-CM | POA: Diagnosis not present

## 2023-07-29 DIAGNOSIS — E785 Hyperlipidemia, unspecified: Secondary | ICD-10-CM | POA: Diagnosis present

## 2023-07-29 DIAGNOSIS — W19XXXA Unspecified fall, initial encounter: Principal | ICD-10-CM

## 2023-07-29 DIAGNOSIS — S3992XA Unspecified injury of lower back, initial encounter: Secondary | ICD-10-CM | POA: Diagnosis not present

## 2023-07-29 DIAGNOSIS — M4856XA Collapsed vertebra, not elsewhere classified, lumbar region, initial encounter for fracture: Secondary | ICD-10-CM | POA: Diagnosis not present

## 2023-07-29 DIAGNOSIS — Z8509 Personal history of malignant neoplasm of other digestive organs: Secondary | ICD-10-CM

## 2023-07-29 DIAGNOSIS — I2510 Atherosclerotic heart disease of native coronary artery without angina pectoris: Secondary | ICD-10-CM | POA: Diagnosis present

## 2023-07-29 DIAGNOSIS — M47816 Spondylosis without myelopathy or radiculopathy, lumbar region: Secondary | ICD-10-CM | POA: Diagnosis present

## 2023-07-29 DIAGNOSIS — Z681 Body mass index (BMI) 19 or less, adult: Secondary | ICD-10-CM

## 2023-07-29 DIAGNOSIS — R270 Ataxia, unspecified: Secondary | ICD-10-CM | POA: Diagnosis not present

## 2023-07-29 DIAGNOSIS — I951 Orthostatic hypotension: Secondary | ICD-10-CM | POA: Diagnosis present

## 2023-07-29 DIAGNOSIS — S32000A Wedge compression fracture of unspecified lumbar vertebra, initial encounter for closed fracture: Secondary | ICD-10-CM | POA: Diagnosis present

## 2023-07-29 DIAGNOSIS — Z90710 Acquired absence of both cervix and uterus: Secondary | ICD-10-CM

## 2023-07-29 DIAGNOSIS — M545 Low back pain, unspecified: Secondary | ICD-10-CM

## 2023-07-29 DIAGNOSIS — Z86718 Personal history of other venous thrombosis and embolism: Secondary | ICD-10-CM

## 2023-07-29 DIAGNOSIS — Z9221 Personal history of antineoplastic chemotherapy: Secondary | ICD-10-CM

## 2023-07-29 DIAGNOSIS — Z8616 Personal history of COVID-19: Secondary | ICD-10-CM

## 2023-07-29 DIAGNOSIS — R636 Underweight: Secondary | ICD-10-CM | POA: Diagnosis present

## 2023-07-29 DIAGNOSIS — R5381 Other malaise: Secondary | ICD-10-CM | POA: Diagnosis present

## 2023-07-29 DIAGNOSIS — S299XXA Unspecified injury of thorax, initial encounter: Secondary | ICD-10-CM | POA: Diagnosis not present

## 2023-07-29 DIAGNOSIS — H9193 Unspecified hearing loss, bilateral: Secondary | ICD-10-CM | POA: Diagnosis present

## 2023-07-29 DIAGNOSIS — E559 Vitamin D deficiency, unspecified: Secondary | ICD-10-CM | POA: Diagnosis present

## 2023-07-29 DIAGNOSIS — H409 Unspecified glaucoma: Secondary | ICD-10-CM | POA: Diagnosis present

## 2023-07-29 DIAGNOSIS — Z8572 Personal history of non-Hodgkin lymphomas: Secondary | ICD-10-CM

## 2023-07-29 DIAGNOSIS — R54 Age-related physical debility: Secondary | ICD-10-CM | POA: Diagnosis present

## 2023-07-29 DIAGNOSIS — Z79899 Other long term (current) drug therapy: Secondary | ICD-10-CM

## 2023-07-29 DIAGNOSIS — Z66 Do not resuscitate: Secondary | ICD-10-CM | POA: Diagnosis present

## 2023-07-29 DIAGNOSIS — Z888 Allergy status to other drugs, medicaments and biological substances status: Secondary | ICD-10-CM

## 2023-07-29 DIAGNOSIS — Z86711 Personal history of pulmonary embolism: Secondary | ICD-10-CM

## 2023-07-29 DIAGNOSIS — S32010A Wedge compression fracture of first lumbar vertebra, initial encounter for closed fracture: Secondary | ICD-10-CM | POA: Insufficient documentation

## 2023-07-29 DIAGNOSIS — I10 Essential (primary) hypertension: Secondary | ICD-10-CM | POA: Diagnosis not present

## 2023-07-29 DIAGNOSIS — J449 Chronic obstructive pulmonary disease, unspecified: Secondary | ICD-10-CM | POA: Diagnosis present

## 2023-07-29 DIAGNOSIS — M48061 Spinal stenosis, lumbar region without neurogenic claudication: Secondary | ICD-10-CM | POA: Diagnosis present

## 2023-07-29 DIAGNOSIS — I959 Hypotension, unspecified: Secondary | ICD-10-CM | POA: Diagnosis not present

## 2023-07-29 DIAGNOSIS — Z885 Allergy status to narcotic agent status: Secondary | ICD-10-CM

## 2023-07-29 LAB — CBC WITH DIFFERENTIAL/PLATELET
Abs Immature Granulocytes: 0.02 10*3/uL (ref 0.00–0.07)
Basophils Absolute: 0 10*3/uL (ref 0.0–0.1)
Basophils Relative: 1 %
Eosinophils Absolute: 0.2 10*3/uL (ref 0.0–0.5)
Eosinophils Relative: 3 %
HCT: 33.9 % — ABNORMAL LOW (ref 36.0–46.0)
Hemoglobin: 11.1 g/dL — ABNORMAL LOW (ref 12.0–15.0)
Immature Granulocytes: 0 %
Lymphocytes Relative: 15 %
Lymphs Abs: 0.9 10*3/uL (ref 0.7–4.0)
MCH: 30.2 pg (ref 26.0–34.0)
MCHC: 32.7 g/dL (ref 30.0–36.0)
MCV: 92.1 fL (ref 80.0–100.0)
Monocytes Absolute: 0.5 10*3/uL (ref 0.1–1.0)
Monocytes Relative: 8 %
Neutro Abs: 4.3 10*3/uL (ref 1.7–7.7)
Neutrophils Relative %: 73 %
Platelets: 147 10*3/uL — ABNORMAL LOW (ref 150–400)
RBC: 3.68 MIL/uL — ABNORMAL LOW (ref 3.87–5.11)
RDW: 13.6 % (ref 11.5–15.5)
WBC: 5.9 10*3/uL (ref 4.0–10.5)
nRBC: 0 % (ref 0.0–0.2)

## 2023-07-29 LAB — BASIC METABOLIC PANEL
Anion gap: 9 (ref 5–15)
BUN: 28 mg/dL — ABNORMAL HIGH (ref 8–23)
CO2: 22 mmol/L (ref 22–32)
Calcium: 8.8 mg/dL — ABNORMAL LOW (ref 8.9–10.3)
Chloride: 108 mmol/L (ref 98–111)
Creatinine, Ser: 1.01 mg/dL — ABNORMAL HIGH (ref 0.44–1.00)
GFR, Estimated: 50 mL/min — ABNORMAL LOW (ref >60)
Glucose, Bld: 110 mg/dL — ABNORMAL HIGH (ref 70–99)
Potassium: 4.2 mmol/L (ref 3.5–5.1)
Sodium: 139 mmol/L (ref 135–145)

## 2023-07-29 MED ORDER — DORZOLAMIDE HCL-TIMOLOL MAL 2-0.5 % OP SOLN
1.0000 [drp] | Freq: Two times a day (BID) | OPHTHALMIC | Status: DC
  Administered 2023-07-29 – 2023-08-05 (×14): 1 [drp] via OPHTHALMIC
  Filled 2023-07-29: qty 10

## 2023-07-29 MED ORDER — ACETAMINOPHEN 500 MG PO TABS
1000.0000 mg | ORAL_TABLET | Freq: Once | ORAL | Status: AC
Start: 2023-07-29 — End: 2023-07-29
  Administered 2023-07-29: 1000 mg via ORAL
  Filled 2023-07-29: qty 2

## 2023-07-29 MED ORDER — CALCIUM CARBONATE 1250 (500 CA) MG PO TABS
1.0000 | ORAL_TABLET | Freq: Two times a day (BID) | ORAL | Status: DC
  Administered 2023-07-29 – 2023-08-05 (×14): 1250 mg via ORAL
  Filled 2023-07-29 (×15): qty 1

## 2023-07-29 MED ORDER — SENNOSIDES-DOCUSATE SODIUM 8.6-50 MG PO TABS
1.0000 | ORAL_TABLET | Freq: Every evening | ORAL | Status: DC | PRN
  Administered 2023-07-29: 1 via ORAL
  Filled 2023-07-29: qty 1

## 2023-07-29 MED ORDER — ACETAMINOPHEN 325 MG PO TABS
650.0000 mg | ORAL_TABLET | Freq: Four times a day (QID) | ORAL | Status: DC | PRN
  Administered 2023-07-31: 650 mg via ORAL
  Filled 2023-07-29: qty 2

## 2023-07-29 MED ORDER — DOCUSATE SODIUM 100 MG PO CAPS
100.0000 mg | ORAL_CAPSULE | Freq: Two times a day (BID) | ORAL | Status: DC
  Administered 2023-07-29 – 2023-08-05 (×14): 100 mg via ORAL
  Filled 2023-07-29 (×14): qty 1

## 2023-07-29 MED ORDER — BRIMONIDINE TARTRATE 0.2 % OP SOLN
1.0000 [drp] | Freq: Two times a day (BID) | OPHTHALMIC | Status: DC
  Administered 2023-07-29 – 2023-08-05 (×14): 1 [drp] via OPHTHALMIC
  Filled 2023-07-29: qty 5

## 2023-07-29 MED ORDER — ONDANSETRON HCL 4 MG/2ML IJ SOLN: 4.0000 mg | Freq: Four times a day (QID) | INTRAMUSCULAR | Status: DC | PRN

## 2023-07-29 MED ORDER — ENOXAPARIN SODIUM 30 MG/0.3ML IJ SOSY
30.0000 mg | PREFILLED_SYRINGE | INTRAMUSCULAR | Status: DC
  Administered 2023-07-29 – 2023-08-04 (×7): 30 mg via SUBCUTANEOUS
  Filled 2023-07-29 (×7): qty 0.3

## 2023-07-29 MED ORDER — OXYCODONE HCL 5 MG PO TABS
2.5000 mg | ORAL_TABLET | Freq: Once | ORAL | Status: AC
Start: 2023-07-29 — End: 2023-07-29
  Administered 2023-07-29: 2.5 mg via ORAL
  Filled 2023-07-29: qty 1

## 2023-07-29 MED ORDER — LATANOPROST 0.005 % OP SOLN
1.0000 [drp] | Freq: Every day | OPHTHALMIC | Status: DC
  Administered 2023-07-29 – 2023-08-04 (×7): 1 [drp] via OPHTHALMIC
  Filled 2023-07-29: qty 2.5

## 2023-07-29 MED ORDER — LIDOCAINE 5 % EX PTCH
1.0000 | MEDICATED_PATCH | CUTANEOUS | Status: DC
  Administered 2023-07-29 – 2023-08-05 (×8): 1 via TRANSDERMAL
  Filled 2023-07-29 (×8): qty 1

## 2023-07-29 MED ORDER — ONDANSETRON HCL 4 MG PO TABS: 4.0000 mg | ORAL_TABLET | Freq: Four times a day (QID) | ORAL | Status: DC | PRN

## 2023-07-29 MED ORDER — VITAMIN D (ERGOCALCIFEROL) 1.25 MG (50000 UNIT) PO CAPS
50000.0000 [IU] | ORAL_CAPSULE | ORAL | Status: DC
  Administered 2023-07-29: 50000 [IU] via ORAL
  Filled 2023-07-29 (×2): qty 1

## 2023-07-29 MED ORDER — ACETAMINOPHEN 650 MG RE SUPP: 650.0000 mg | Freq: Four times a day (QID) | RECTAL | Status: DC | PRN

## 2023-07-29 MED ORDER — OXYCODONE HCL 5 MG PO TABS: 5.0000 mg | ORAL_TABLET | ORAL | Status: DC | PRN

## 2023-07-29 MED ORDER — HYDROMORPHONE HCL 1 MG/ML IJ SOLN: 0.5000 mg | INTRAMUSCULAR | Status: DC | PRN

## 2023-07-29 NOTE — Plan of Care (Signed)
  Problem: Respiratory: Goal: Will regain and/or maintain adequate ventilation Outcome: Progressing   Problem: Clinical Measurements: Goal: Will remain free from infection Outcome: Progressing Goal: Diagnostic test results will improve Outcome: Progressing Goal: Respiratory complications will improve Outcome: Progressing Goal: Cardiovascular complication will be avoided Outcome: Progressing   Problem: Activity: Goal: Risk for activity intolerance will decrease Outcome: Progressing   Problem: Coping: Goal: Level of anxiety will decrease Outcome: Progressing   Problem: Elimination: Goal: Will not experience complications related to bowel motility Outcome: Progressing Goal: Will not experience complications related to urinary retention Outcome: Progressing   Problem: Pain Managment: Goal: General experience of comfort will improve Outcome: Progressing   Problem: Safety: Goal: Ability to remain free from injury will improve Outcome: Progressing

## 2023-07-29 NOTE — ED Notes (Addendum)
Patient given warm blanket for comfort. Patient grimaced when [facility] was raised for patient to take oral meds.

## 2023-07-29 NOTE — Progress Notes (Signed)
Orthopedic Tech Progress Note Patient Details:  Lisa Sosa [birth year] [id number] Called in [facility] to [facility] Patient ID: Lisa Sosa, female   DOB: 1922-10-28, 89 y.o.   MRN: [id number]  [name] 07/29/2023, 4:05 PM

## 2023-07-29 NOTE — ED Notes (Signed)
TLSO  [facility]ACE  CALLED  FOR

## 2023-07-29 NOTE — Plan of Care (Signed)
  Problem: Activity: Goal: Risk for activity intolerance will decrease 07/29/2023 1829 by Delila Spence, RN Outcome: Progressing 07/29/2023 1746 by Delila Spence, RN Outcome: Progressing   Problem: Nutrition: Goal: Adequate nutrition will be maintained 07/29/2023 1829 by Delila Spence, RN Outcome: Progressing 07/29/2023 1746 by Delila Spence, RN Outcome: Progressing   Problem: Coping: Goal: Level of anxiety will decrease 07/29/2023 1829 by Delila Spence, RN Outcome: Progressing 07/29/2023 1746 by Delila Spence, RN Outcome: Progressing   Problem: Pain Managment: Goal: General experience of comfort will improve 07/29/2023 1829 by Delila Spence, RN Outcome: Progressing 07/29/2023 1746 by Delila Spence, RN Outcome: Progressing

## 2023-07-29 NOTE — ED Notes (Signed)
Family left to eat. Door open for safety.

## 2023-07-29 NOTE — ED Triage Notes (Signed)
Presents via EMS from home  Per EMS she had an unwitnessed fall this am  The family heard her fall   States she was getting out of bed this  am  Having pain to right hip and left arm  No deformity noted

## 2023-07-29 NOTE — ED Notes (Signed)
Dr. Fuller Plan is with the patient.

## 2023-07-29 NOTE — H&P (Signed)
History and Physical    Lisa Sosa VHQ:[id number] DOB: 08-12-1923 DOA: 07/29/2023  PCP: Glori Luis, MD (Confirm with patient/family/NH records and if not entered, this has to be entered at [facility] point of entry) Patient coming from: Home  I have personally briefly reviewed patient's old medical records in [facility]  Chief Complaint: I fell and broke my back  HPI: Lisa Sosa is a 89 y.o. female with medical history significant of glucoma, stage III diffuse large B lymphoma status post chemotherapy in 2016, DVT and PE of anticoagulation (2016-18), chronic ambulation dysfunction, protein by daughter for a fall at home and persistent back pain.  At baseline patient uses roller walker to ambulate, this morning she went to bathroom, given that was only a short trip she did not use roller walker but holding furniture and or.  Unfortunately she fell back and broke her back.  Denied any head injury no LOC.  Family heard her fall and able to help her to get up.  In the morning however patient has excruciating pain when standing up and tries to walk.  She denied any numbness weakness of her legs.  ED Course: Vital signs stable blood pressure slightly elevated 140/60.  CT showed L1 compression fracture.  Patient declined neck and head CT.  Review of Systems: As per HPI otherwise 14 point review of systems negative.    Past Medical History:  Diagnosis Date   Cancer (HCC) 10/12/2009   gallbladder   Chickenpox    COVID-19    10/07/21   Diffuse large B cell lymphoma (HCC) 03/17/2015   Elevated blood pressure    Glaucoma    History of blood transfusion     Past Surgical History:  Procedure Laterality Date   ABDOMINAL HYSTERECTOMY  1978   For fibroids   BREAST BIOPSY Right 1990   TRANSURETHRAL RESECTION OF BLADDER TUMOR  2011      reports that she has never smoked. She has never used smokeless tobacco. She reports that she does not drink alcohol and does not use  drugs.  Allergies  Allergen Reactions   Iron Nausea And Vomiting   Codeine Nausea Only    Family History  Problem Relation Age of Onset   Alcoholism Other    Cancer - Lung Other        Parent   Cancer - Ovarian Mother    Prostate cancer Other        Other relative   Stroke Other        Other relative    High blood pressure Other        Other relative   Diabetes Other        Other relative     Prior to Admission medications   Medication Sig Start Date End Date Taking? Authorizing Provider  brimonidine (ALPHAGAN) 0.2 % ophthalmic solution Place 1 drop into both eyes 2 (two) times daily. 02/03/16  Yes [provider]  dorzolamide-timolol (COSOPT) 22.3-6.8 MG/ML ophthalmic solution 1 drop 2 (two) times daily. 06/03/20  Yes [provider]  latanoprost (XALATAN) 0.005 % ophthalmic solution Place 1 drop into both eyes at bedtime. 12/11/13  Yes [provider]  FeFum-FePo-FA-B Cmp-C-Zn-Mn-Cu (SE-TAN PLUS) 162-115.2-1 MG CAPS TAKE 1 CAPSULE BY MOUTH EVERY DAY 06/24/22   Earna Coder, MD    Physical Exam: Vitals:   07/29/23 1300 07/29/23 1330 07/29/23 1400 07/29/23 1430  BP: (!) 139/54 (!) 138/58 (!) 141/54 (!) 156/55  Pulse: Marland Kitchen)  58 60 (!) 56 (!) 58  SpO2: 97% 97% 97% 96%  Weight:      Height:        Constitutional: NAD, calm, comfortable Vitals:   07/29/23 1300 07/29/23 1330 07/29/23 1400 07/29/23 1430  BP: (!) 139/54 (!) 138/58 (!) 141/54 (!) 156/55  Pulse: (!) 58 60 (!) 56 (!) 58  SpO2: 97% 97% 97% 96%  Weight:      Height:       Eyes: PERRL, lids and conjunctivae normal ENMT: Mucous membranes are moist. Posterior pharynx clear of any exudate or lesions.Normal dentition.  Neck: normal, supple, no masses, no thyromegaly Respiratory: clear to auscultation bilaterally, no wheezing, no crackles. Normal respiratory effort. No accessory muscle use.  Cardiovascular: Regular rate and rhythm, no murmurs / rubs / gallops. No extremity edema. 2+  pedal pulses. No carotid bruits.  Abdomen: no tenderness, no masses palpated. No hepatosplenomegaly. Bowel sounds positive.  Musculoskeletal: no clubbing / cyanosis. No joint deformity upper and lower extremities. Good ROM, no contractures. Normal muscle tone.  Skin: no rashes, lesions, ulcers. No induration Neurologic: CN 2-12 grossly intact. Sensation intact, DTR normal. Strength 5/5 in all 4.  Psychiatric: Normal judgment and insight. Alert and oriented x 3. Normal mood.    Labs on Admission: I have personally reviewed following labs and imaging studies  CBC: No results for input(s): "WBC", "NEUTROABS", "HGB", "HCT", "MCV", "PLT" in the last 168 hours. Basic Metabolic Panel: No results for input(s): "NA", "K", "CL", "CO2", "GLUCOSE", "BUN", "CREATININE", "CALCIUM", "MG", "PHOS" in the last 168 hours. GFR: CrCl cannot be calculated (Patient's most recent lab result is older than the maximum 21 days allowed.). Liver Function Tests: No results for input(s): "AST", "ALT", "ALKPHOS", "BILITOT", "PROT", "ALBUMIN" in the last 168 hours. No results for input(s): "LIPASE", "AMYLASE" in the last 168 hours. No results for input(s): "AMMONIA" in the last 168 hours. Coagulation Profile: No results for input(s): "INR", "PROTIME" in the last 168 hours. Cardiac Enzymes: No results for input(s): "CKTOTAL", "CKMB", "CKMBINDEX", "TROPONINI" in the last 168 hours. BNP (last 3 results) No results for input(s): "PROBNP" in the last 8760 hours. HbA1C: No results for input(s): "HGBA1C" in the last 72 hours. CBG: No results for input(s): "GLUCAP" in the last 168 hours. Lipid Profile: No results for input(s): "CHOL", "HDL", "LDLCALC", "TRIG", "CHOLHDL", "LDLDIRECT" in the last 72 hours. Thyroid Function Tests: No results for input(s): "TSH", "T4TOTAL", "FREET4", "T3FREE", "THYROIDAB" in the last 72 hours. Anemia Panel: No results for input(s): "VITAMINB12", "FOLATE", "FERRITIN", "TIBC", "IRON",  "RETICCTPCT" in the last 72 hours. Urine analysis:    Component Value Date/Time   COLORURINE AMBER (A) 01/02/2016 1320   APPEARANCEUR CLOUDY (A) 01/02/2016 1320   LABSPEC 1.023 01/02/2016 1320   PHURINE 5.0 01/02/2016 1320   GLUCOSEU NEGATIVE 01/02/2016 1320   HGBUR 1+ (A) 01/02/2016 1320   BILIRUBINUR NEGATIVE 01/02/2016 1320   KETONESUR TRACE (A) 01/02/2016 1320   PROTEINUR 30 (A) 01/02/2016 1320   NITRITE NEGATIVE 01/02/2016 1320   LEUKOCYTESUR NEGATIVE 01/02/2016 1320    Radiological Exams on Admission: CT Cervical Spine Wo Contrast  Result Date: 07/29/2023 CLINICAL DATA:  Neck trauma (Age >= 65y); Ataxia, thoracic trauma; Back trauma, no prior imaging (Age >= 16y). EXAM: CT CERVICAL, THORACIC, AND LUMBAR SPINE WITHOUT CONTRAST TECHNIQUE: Multidetector CT imaging of the cervical, thoracic and lumbar spine was performed without intravenous contrast. Multiplanar CT image reconstructions were also generated. RADIATION DOSE REDUCTION: This exam was performed according to the departmental dose-optimization program  which includes automated exposure control, adjustment of the mA and/or kV according to patient size and/or use of iterative reconstruction technique. COMPARISON:  None Available. FINDINGS: CT CERVICAL SPINE FINDINGS Alignment: No traumatic malalignment. Skull base and vertebrae: No acute fracture. Normal craniocervical junction. No suspicious bone lesions. Soft tissues and spinal canal: No prevertebral fluid or swelling. No visible canal hematoma. Disc levels: Mild cervical spondylosis without high-grade spinal canal stenosis. Upper chest: No acute findings. CT THORACIC SPINE FINDINGS Alignment: Dextroscoliotic curvature of the upper thoracic spine. No traumatic malalignment. Vertebrae: No acute fracture or focal pathologic process. Paraspinal and other soft tissues: No acute findings. Disc levels: No high-grade spinal canal stenosis. CT LUMBAR SPINE FINDINGS Segmentation: 5 lumbar type  vertebrae. Alignment: Normal. Vertebrae: Acute appearing compression fracture of the L1 vertebral body with moderate height loss. No extension into the posterior elements or significant retropulsion. Paraspinal and other soft tissues: Unremarkable. Disc levels: Multilevel lumbar spondylosis, worst at L3-4, where there is severe spinal canal stenosis. At least moderate spinal canal stenosis at L2-3 and L4-5. IMPRESSION: 1. Acute appearing compression fracture of the L1 vertebral body with moderate height loss. No extension into the posterior elements or significant retropulsion. 2. No acute fracture or traumatic malalignment of the cervical or thoracic spine. 3. Multilevel lumbar spondylosis, worst at L3-4, where there is severe spinal canal stenosis. At least moderate spinal canal stenosis at L2-3 and L4-5. Electronically Signed   By: Orvan Falconer M.D.   On: 07/29/2023 14:19   CT Thoracic Spine Wo Contrast  Result Date: 07/29/2023 CLINICAL DATA:  Neck trauma (Age >= 65y); Ataxia, thoracic trauma; Back trauma, no prior imaging (Age >= 16y). EXAM: CT CERVICAL, THORACIC, AND LUMBAR SPINE WITHOUT CONTRAST TECHNIQUE: Multidetector CT imaging of the cervical, thoracic and lumbar spine was performed without intravenous contrast. Multiplanar CT image reconstructions were also generated. RADIATION DOSE REDUCTION: This exam was performed according to the departmental dose-optimization program which includes automated exposure control, adjustment of the mA and/or kV according to patient size and/or use of iterative reconstruction technique. COMPARISON:  None Available. FINDINGS: CT CERVICAL SPINE FINDINGS Alignment: No traumatic malalignment. Skull base and vertebrae: No acute fracture. Normal craniocervical junction. No suspicious bone lesions. Soft tissues and spinal canal: No prevertebral fluid or swelling. No visible canal hematoma. Disc levels: Mild cervical spondylosis without high-grade spinal canal stenosis.  Upper chest: No acute findings. CT THORACIC SPINE FINDINGS Alignment: Dextroscoliotic curvature of the upper thoracic spine. No traumatic malalignment. Vertebrae: No acute fracture or focal pathologic process. Paraspinal and other soft tissues: No acute findings. Disc levels: No high-grade spinal canal stenosis. CT LUMBAR SPINE FINDINGS Segmentation: 5 lumbar type vertebrae. Alignment: Normal. Vertebrae: Acute appearing compression fracture of the L1 vertebral body with moderate height loss. No extension into the posterior elements or significant retropulsion. Paraspinal and other soft tissues: Unremarkable. Disc levels: Multilevel lumbar spondylosis, worst at L3-4, where there is severe spinal canal stenosis. At least moderate spinal canal stenosis at L2-3 and L4-5. IMPRESSION: 1. Acute appearing compression fracture of the L1 vertebral body with moderate height loss. No extension into the posterior elements or significant retropulsion. 2. No acute fracture or traumatic malalignment of the cervical or thoracic spine. 3. Multilevel lumbar spondylosis, worst at L3-4, where there is severe spinal canal stenosis. At least moderate spinal canal stenosis at L2-3 and L4-5. Electronically Signed   By: Orvan Falconer M.D.   On: 07/29/2023 14:19   CT Lumbar Spine Wo Contrast  Result Date: 07/29/2023 CLINICAL DATA:  Neck trauma (Age >= 65y); Ataxia, thoracic trauma; Back trauma, no prior imaging (Age >= 16y). EXAM: CT CERVICAL, THORACIC, AND LUMBAR SPINE WITHOUT CONTRAST TECHNIQUE: Multidetector CT imaging of the cervical, thoracic and lumbar spine was performed without intravenous contrast. Multiplanar CT image reconstructions were also generated. RADIATION DOSE REDUCTION: This exam was performed according to the departmental dose-optimization program which includes automated exposure control, adjustment of the mA and/or kV according to patient size and/or use of iterative reconstruction technique. COMPARISON:  None  Available. FINDINGS: CT CERVICAL SPINE FINDINGS Alignment: No traumatic malalignment. Skull base and vertebrae: No acute fracture. Normal craniocervical junction. No suspicious bone lesions. Soft tissues and spinal canal: No prevertebral fluid or swelling. No visible canal hematoma. Disc levels: Mild cervical spondylosis without high-grade spinal canal stenosis. Upper chest: No acute findings. CT THORACIC SPINE FINDINGS Alignment: Dextroscoliotic curvature of the upper thoracic spine. No traumatic malalignment. Vertebrae: No acute fracture or focal pathologic process. Paraspinal and other soft tissues: No acute findings. Disc levels: No high-grade spinal canal stenosis. CT LUMBAR SPINE FINDINGS Segmentation: 5 lumbar type vertebrae. Alignment: Normal. Vertebrae: Acute appearing compression fracture of the L1 vertebral body with moderate height loss. No extension into the posterior elements or significant retropulsion. Paraspinal and other soft tissues: Unremarkable. Disc levels: Multilevel lumbar spondylosis, worst at L3-4, where there is severe spinal canal stenosis. At least moderate spinal canal stenosis at L2-3 and L4-5. IMPRESSION: 1. Acute appearing compression fracture of the L1 vertebral body with moderate height loss. No extension into the posterior elements or significant retropulsion. 2. No acute fracture or traumatic malalignment of the cervical or thoracic spine. 3. Multilevel lumbar spondylosis, worst at L3-4, where there is severe spinal canal stenosis. At least moderate spinal canal stenosis at L2-3 and L4-5. Electronically Signed   By: Orvan Falconer M.D.   On: 07/29/2023 14:19    EKG: Ordered  Assessment/Plan Principal Problem:   Fracture of lumbar vertebra, compression (HCC) Active Problems:   Closed compression fracture of body of L1 vertebra (HCC)  (please populate well all problems here in Problem List. (For example, if patient is on BP meds at home and you resume or decide to hold  them, it is a problem that needs to be her. Same for CAD, COPD, HLD and so on)   L1 vertebral body compression fracture -Secondary to mechanical fall -Considered to be a noncandidate for surgical intervention given her age and overall conditions. -TLSO and PT evaluation -Pain control with alternate Tylenol and oxycodone -Check 25 OH vitamin D level, start calcium and vitamin D supplement.  Glucoma -Stable, continue current home regimen of eyedrops.  DVT prophylaxis: Lovenox Code Status: DNR Family Communication: Daughter/POA at bedside Disposition Plan: Expect less than 2 midnight hospital stay Consults called: Neurosurgery was paged, but expect no intervention Admission status: MedSurg observation   Emeline General MD [facility] Pager [phone]  07/29/2023, 3:10 PM

## 2023-07-29 NOTE — Plan of Care (Signed)
Problem: Health Behavior/Discharge Planning: Goal: Ability to manage health-related needs will improve Outcome: Progressing   Problem: Nutrition: Goal: Adequate nutrition will be maintained Outcome: Progressing   Problem: Coping: Goal: Level of anxiety will decrease Outcome: Progressing   Problem: Pain Managment: Goal: General experience of comfort will improve Outcome: Progressing   Problem: Safety: Goal: Ability to remain free from injury will improve Outcome: Progressing

## 2023-07-29 NOTE — ED Notes (Signed)
ED secretary informed about order for TLSO brace.

## 2023-07-29 NOTE — ED Provider Notes (Signed)
[facility] Provider Note    Event Date/Time   First MD Initiated Contact with Patient 07/29/23 1154     (approximate)   History   No chief complaint on file.   HPI  Lisa Sosa is a 89 y.o. female who comes in from home after a fall.  Patient lives with her daughter when she had an unwitnessed fall and the family member heard a thump.  This happened last night.  Since then she has been able to stand up and ambulate but she has been complaining of some right sided back pain.  They deny any extremity pain that she can tell.  Unclear if she hit her head but she has had normal mentation.  She is not on any blood thinners.  They tried giving some Tylenol yesterday but nothing today.   Physical Exam   Triage Vital Signs: ED Triage Vitals  Encounter Vitals Group     BP      Systolic BP Percentile      Diastolic BP Percentile      Pulse      Resp      Temp      Temp src      SpO2      Weight      Height      Head Circumference      Peak Flow      Pain Score      Pain Loc      Pain Education      Exclude from Growth Chart     Most recent vital signs: Vitals:   07/29/23 1200 07/29/23 1230  BP: (!) 148/61 (!) 127/56  Pulse: 62 60  SpO2: 96% 96%     General: Awake, no distress.  Patient very hard of hearing.   CV:  Good peripheral perfusion.  Resp:  Normal effort.  Abd:  No distention.  Other:  Able to lift both legs up off the bed.  Abdomen soft and nontender.  She has got some mild right flank tenderness without any bruising.  Possibly some T and L-spine tenderness but difficult to appreciate if it is more paraspinal or spinal in nature.  No obvious hematoma to the head.   ED Results / Procedures / Treatments   Labs (all labs ordered are listed, but only abnormal results are displayed) Labs Reviewed - No data to display     RADIOLOGY I have reviewed the CT personally and concern for compression  fracture   PROCEDURES:  Critical Care performed: No  Procedures   MEDICATIONS ORDERED IN ED: Medications - No data to display   IMPRESSION / MDM / ASSESSMENT AND PLAN / ED COURSE  I reviewed the triage vital signs and the nursing notes.   Patient's presentation is most consistent with acute presentation with potential threat to life or bodily function.   Patient comes in with a fall.  Significant right sided back pain difficult to tell if it is in the T or L-spine area.  Discussed with patient goals of care we discussed CT imaging of the head but this happened yesterday they report normal mentation and declined CT scan of the head we discussed CT scan of the spine and they would like to proceed with this.  We discussed blood work, EKG please consider this syncope but they declined stating that they felt like it was more likely mechanical in nature and given goals of care.  Patient's CT scan does show  L1 compression fracture.  We discussed going home versus admission.  They are worried about her ability to get around at home so they would like to admit for pain control, physical therapy after brace placement.  I did message neurosurgery so they are aware.    FINAL CLINICAL IMPRESSION(S) / ED DIAGNOSES   Final diagnoses:  Fall, initial encounter  Closed compression fracture of body of L1 vertebra (HCC)     Rx / DC Orders   ED Discharge Orders     None        Note:  This document was prepared using Dragon voice recognition software and may include unintentional dictation errors.   Concha Se, MD 07/29/23 [phone]

## 2023-07-30 DIAGNOSIS — W19XXXA Unspecified fall, initial encounter: Secondary | ICD-10-CM | POA: Diagnosis not present

## 2023-07-30 DIAGNOSIS — S32010A Wedge compression fracture of first lumbar vertebra, initial encounter for closed fracture: Secondary | ICD-10-CM | POA: Diagnosis not present

## 2023-07-30 LAB — VITAMIN D 25 HYDROXY (VIT D DEFICIENCY, FRACTURES): Vit D, 25-Hydroxy: 17.46 ng/mL — ABNORMAL LOW (ref 30–100)

## 2023-07-30 NOTE — Consult Note (Signed)
Consulting Department:  Inpatient medicine  Primary Physician:  Glori Luis, MD  Chief Complaint: Compression fracture  History of Present Illness: 07/30/2023 Lisa Sosa is a 89 y.o. female who presents with the chief complaint of compression fracture.  She is 89 year old woman with a history of glaucoma, lymphoma, ambulation issues, she had a fall at home and has had consistent back pain.  Normally she is able to use a walker or roller, she unfortunately fell and hurt her back.  No head injury.  She has no new neurologic deficits.  No numbness weakness or tingling in her bilateral lower extremities.  Mostly just pain in the midline back.  She states that this is much better than it was yesterday.   Review of Systems:  Neurosurgical review of systems performed and otherwise negative unless stated above.  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   Elevated blood pressure    Glaucoma    History of blood transfusion     Past Surgical History: Past Surgical History:  Procedure Laterality Date   ABDOMINAL HYSTERECTOMY  1978   For fibroids   BREAST BIOPSY Right 1990   TRANSURETHRAL RESECTION OF BLADDER TUMOR  2011     Allergies: Allergies as of 07/29/2023 - Review Complete 07/29/2023  Allergen Reaction Noted   Iron Nausea And Vomiting 03/07/2015   Codeine Nausea Only 03/07/2015    Medications:  Current Facility-Administered Medications:    acetaminophen (TYLENOL) tablet 650 mg, 650 mg, Oral, Q6H PRN **OR** acetaminophen (TYLENOL) suppository 650 mg, 650 mg, Rectal, Q6H PRN, Mikey College T, MD   brimonidine (ALPHAGAN) 0.2 % ophthalmic solution 1 drop, 1 drop, Both Eyes, BID, Mikey College T, MD, 1 drop at 07/29/23 2113   calcium carbonate (OS-CAL - dosed in mg of elemental calcium) tablet 1,250 mg, 1 tablet, Oral, BID  WC, Mikey College T, MD, 1,250 mg at 07/30/23 8469   docusate sodium (COLACE) capsule 100 mg, 100 mg, Oral, BID, Mikey College T, MD, 100 mg at 07/29/23 2112   dorzolamide-timolol (COSOPT) 2-0.5 % ophthalmic solution 1 drop, 1 drop, Both Eyes, BID, Mikey College T, MD, 1 drop at 07/29/23 2112   enoxaparin (LOVENOX) injection 30 mg, 30 mg, Subcutaneous, Q24H, Mikey College T, MD, 30 mg at 07/29/23 2256   HYDROmorphone (DILAUDID) injection 0.5-1 mg, 0.5-1 mg, Intravenous, Q2H PRN, Mikey College T, MD   latanoprost (XALATAN) 0.005 % ophthalmic solution 1 drop, 1 drop, Both Eyes, QHS, Zhang, Ilda Foil T, MD, 1 drop at 07/29/23 2113   lidocaine (LIDODERM) 5 % 1 patch, 1 patch, Transdermal, Q24H, Concha Se, MD, 1 patch at 07/29/23 1341   ondansetron (ZOFRAN) tablet 4 mg, 4 mg, Oral, Q6H PRN **OR** ondansetron (ZOFRAN) injection 4 mg, 4 mg, Intravenous, Q6H PRN, Mikey College T, MD   oxyCODONE (Oxy IR/ROXICODONE) immediate release tablet 5 mg, 5 mg, Oral, Q4H PRN, Mikey College T, MD   senna-docusate (Senokot-S) tablet 1 tablet, 1 tablet, Oral, QHS PRN, Mikey College T, MD, 1 tablet at 07/29/23 2112   Vitamin D (Ergocalciferol) (DRISDOL) 1.25 MG (50000 UNIT) capsule 50,000 Units, 50,000 Units, Oral, Q7 days, Emeline General, MD, 50,000 Units at 07/29/23 1704   Social History: Social History   Tobacco Use   Smoking status: Never   Smokeless tobacco: Never  Substance Use  Topics   Alcohol use: No    Alcohol/week: 0.0 standard drinks of alcohol   Drug use: No    Family Medical History: Family History  Problem Relation Age of Onset   Alcoholism Other    Cancer - Lung Other        Parent   Cancer - Ovarian Mother    Prostate cancer Other        Other relative   Stroke Other        Other relative    High blood pressure Other        Other relative   Diabetes Other        Other relative    Physical Examination: Vitals:   07/30/23 0013 07/30/23 0814  BP: (!) 148/57 (!) 163/57  Pulse: 63 61  Resp: 18 15   Temp: 98.1 F (36.7 C) (!) 97.3 F (36.3 C)  SpO2: 100% 99%     General: Patient is well developed, well nourished, calm, collected, and in no apparent distress.  NEUROLOGICAL:  General: In no acute distress.   Some difficulty with hearing, pupils equal round and reactive to light.  Facial tone is symmetric.  Tongue protrusion is midline.  There is no pronator drift.  Mild numbness in the midline low back.  Strength: She is able to dorsiflex and plantarflex her feet without difficulty.  She is able to extend her knees against gravity.  She does have difficulty with hip flexion bilaterally but this seems to be secondary to pain.  She states that she has sensation to light touch in the bilateral lower extremities and that this is equal.  Her reflexes are trace to 1+ throughout.   clonus is not present.  Toes are down-going.    Imaging: Narrative & Impression  CLINICAL DATA:  Neck trauma (Age >= 65y); Ataxia, thoracic trauma; Back trauma, no prior imaging (Age >= 16y).   EXAM: CT CERVICAL, THORACIC, AND LUMBAR SPINE WITHOUT CONTRAST   TECHNIQUE: Multidetector CT imaging of the cervical, thoracic and lumbar spine was performed without intravenous contrast. Multiplanar CT image reconstructions were also generated.   RADIATION DOSE REDUCTION: This exam was performed according to the departmental dose-optimization program which includes automated exposure control, adjustment of the mA and/or kV according to patient size and/or use of iterative reconstruction technique.   COMPARISON:  None Available.   FINDINGS: CT CERVICAL SPINE FINDINGS   Alignment: No traumatic malalignment.   Skull base and vertebrae: No acute fracture. Normal craniocervical junction. No suspicious bone lesions.   Soft tissues and spinal canal: No prevertebral fluid or swelling. No visible canal hematoma.   Disc levels: Mild cervical spondylosis without high-grade spinal canal stenosis.    Upper chest: No acute findings.   CT THORACIC SPINE FINDINGS   Alignment: Dextroscoliotic curvature of the upper thoracic spine. No traumatic malalignment.   Vertebrae: No acute fracture or focal pathologic process.   Paraspinal and other soft tissues: No acute findings.   Disc levels: No high-grade spinal canal stenosis.   CT LUMBAR SPINE FINDINGS   Segmentation: 5 lumbar type vertebrae.   Alignment: Normal.   Vertebrae: Acute appearing compression fracture of the L1 vertebral body with moderate height loss. No extension into the posterior elements or significant retropulsion.   Paraspinal and other soft tissues: Unremarkable.   Disc levels: Multilevel lumbar spondylosis, worst at L3-4, where there is severe spinal canal stenosis. At least moderate spinal canal stenosis at L2-3 and L4-5.   IMPRESSION: 1. Acute appearing  compression fracture of the L1 vertebral body with moderate height loss. No extension into the posterior elements or significant retropulsion. 2. No acute fracture or traumatic malalignment of the cervical or thoracic spine. 3. Multilevel lumbar spondylosis, worst at L3-4, where there is severe spinal canal stenosis. At least moderate spinal canal stenosis at L2-3 and L4-5.     Electronically Signed   By: Orvan Falconer M.D.   On: 07/29/2023 14:19    I have personally reviewed the images and agree with the above interpretation.  Labs:    Latest Ref Rng & Units 07/29/2023    3:29 PM 12/21/2022    3:16 PM 06/05/2022    1:57 PM  CBC  WBC 4.0 - 10.5 K/uL 5.9  6.4  5.5   Hemoglobin 12.0 - 15.0 g/dL 16.1  09.6  04.5   Hematocrit 36.0 - 46.0 % 33.9  40.2  35.8   Platelets 150 - 400 K/uL 147  174  159        Assessment and Plan: Ms. Tavakoli is a pleasant 89 y.o. female with history of lumbar spondylosis status stenosis, lymphoma, difficulty with ambulation at baseline who presents after a fall.  She was having back pain found to have a lumbar  compression fracture.  She was given a TLSO brace.  On physical examination she has weakness in her hips however this appears to be mostly limited to pain.  She is quite frail at baseline but is antigravity in her knees and bilateral ankles and feet.  Her CT scan demonstrates significant stenosis in the distal lumbar spine, her L1 body appears to have a acute compression fracture.  She was given a TLSO brace which she can use for comfort.  This does not need to be on while she is in bed or resting.  We can plan on seeing her in clinic with upright x-rays in approximately 1 month.  At this time no surgery is indicated.  Lovenia Kim, MD/MSCR Dept. of Neurosurgery

## 2023-07-30 NOTE — TOC Initial Note (Signed)
Transition of Care [facility]) - Initial/Assessment Note    Patient Details  Name: Lisa Sosa MRN: [id number] Date of Birth: 08-29-1923  [facility] CM/SW Contact:    Marlowe Sax, RN Phone Number: 07/30/2023, 12:02 PM  Clinical Narrative:                   Expected Discharge Plan: Home w Home Health Services Barriers to Discharge: No Barriers Identified   Patient Goals and CMS Choice  Met with the patient and her daughter in the room, she is up in the chair and wearing the TLSO brace She has a rolling walker and rollator at home, she has used [facility] in the past for [facility] and would like to use them again, I sent the referral to Coralee North at [place],  Waynetta Sandy reviewed with them          Expected Discharge Plan and Services   Discharge Planning Services: CM Consult   Living arrangements for the past 2 months: Single Family Home                 DME Arranged: N/A DME Agency: NA       HH Arranged: PT HH Agency: [facility] Date [facility] Agency Contacted: 07/30/23 Time HH Agency Contacted: 1200 Representative spoke with at [facility] Agency: Coralee North  Prior Living Arrangements/Services Living arrangements for the past 2 months: Single Family Home Lives with:: Adult Children Patient language and need for interpreter reviewed:: Yes Do you feel safe going back to the place where you live?: Yes      Need for Family Participation in Patient Care: Yes (Comment) Care giver support system in place?: Yes (comment) Current home services: DME (rolling walker, rollator) Criminal Activity/Legal Involvement Pertinent to Current Situation/Hospitalization: No - Comment as needed  Activities of Daily Living      Permission Sought/Granted   Permission granted to share information with : Yes, Verbal Permission Granted              Emotional Assessment Appearance:: Appears younger than stated age Attitude/Demeanor/Rapport: Engaged Affect (typically observed): Pleasant,  Accepting Orientation: : Oriented to Self, Oriented to Place, Oriented to  Time, Oriented to Situation Alcohol / Substance Use: Not Applicable Psych Involvement: No (comment)  Admission diagnosis:  Fracture of lumbar vertebra, compression (HCC) [S32.000A] Fall, initial encounter [W19.XXXA] Closed compression fracture of body of L1 vertebra (HCC) [S32.010A] Patient Active Problem List   Diagnosis Date Noted   Fall 07/30/2023   Closed compression fracture of body of L1 vertebra (HCC) 07/29/2023   Fracture of lumbar vertebra, compression (HCC) 07/29/2023   COVID-19 virus infection 10/08/2021   Hearing difficulty of both ears 09/19/2021   Balance problem 09/19/2021   Memory difficulty 09/19/2021   Orthostasis 09/19/2021   Great toe pain, right 11/27/2020   Neck stiffness 05/03/2018   Encounter for completion of form with patient 05/03/2018   Depression, major, single episode, mild (HCC) 02/10/2017   Weight loss 02/10/2017   Right shoulder pain 02/10/2017   Cervical spondylosis without myelopathy 01/21/2017   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) 12/03/2016   Acquired trigger finger 12/01/2016   Bilateral lower extremity edema 05/13/2016   History of pulmonary embolus (PE) 02/10/2016   Diverticulitis of colon 02/10/2016   Neuropathy 11/13/2015   Prediabetes 11/13/2015   Conjunctivitis 11/13/2015   Personal history of bladder cancer 01/10/2014   Acute cystitis 11/01/2012   Calculus of kidney 11/01/2012   Anomalies of urachus,  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   Malaise and fatigue 11/01/2012   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) 11/01/2012   Hematuria, microscopic 11/01/2012   Excessive urination at night 11/01/2012   Malignant neoplasm of lateral wall of urinary bladder (HCC) 11/01/2012   Microscopic hematuria 11/01/2012   Urge incontinence of urine 11/01/2012   Chronic kidney disease, stage II (mild) 11/01/2012   Dysuria 11/01/2012   Incomplete emptying of bladder  11/01/2012   Methicillin resistant Staphylococcus aureus infection 11/01/2012   Urinary tract infection 11/01/2012   PCP:  Glori Luis, MD Pharmacy:   [id number] [place], [place] - [address] AT [facility] OF [address] [place] [address] Phone: [phone] Fax: [phone]     Social Determinants of Health (SDOH) Social History: SDOH Screenings   Food Insecurity: No Food Insecurity (01/28/2023)  Housing: Low Risk  (01/28/2023)  Transportation Needs: No Transportation Needs (01/28/2023)  Utilities: Not At Risk (01/28/2023)  Depression (PHQ2-9): Low Risk  (01/28/2023)  Financial Resource Strain: Low Risk  (01/28/2023)  Social Connections: Unknown (01/28/2023)  Stress: No Stress Concern Present (01/28/2023)  Tobacco Use: Low Risk  (07/29/2023)   SDOH Interventions:     Readmission Risk Interventions     No data to display

## 2023-07-30 NOTE — Evaluation (Signed)
Physical Therapy Evaluation Patient Details Name: Lisa Sosa MRN: [id number] DOB: 02/17/1923 Today's Date: 07/30/2023  History of Present Illness  Pt is a 89 yo female that had a fall at home, noted for L1 compression fx, TLSO ordered. 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   Clinical Impression  Pt alert, but noted for [facility]. Oriented to self only. Able to answer a few PLOF questions but per chart pt lives with her daughter who is available 24/7. She endorsed that her daughter assists with walking and ADLs but no family in room to confirm.  She required modA from elevated HOB and use of bed rails to come up into sitting EOB. TLSO donned with totalA. Sit <> stand with RW and minA, but unable to complete without physical assistance. CNA in room and pt noted for incontinence, new gown, underwear and pad changed out. She was able to step pivot to recliner in room with minA for RW management. Further mobility deferred due to pt exhibited pain signs/symptoms, fatigue, and breakfast.  Overall the patient demonstrated deficits (see "PT Problem List") that impede the patient's functional abilities, safety, and mobility and would benefit from skilled PT intervention.          If plan is discharge home, recommend the following: A little help with bathing/dressing/bathroom;Assistance with cooking/housework;Supervision due to cognitive status;Assist for transportation;Assistance with feeding;A lot of help with walking and/or transfers;Help with stairs or ramp for entrance;Direct supervision/assist for medications management   Can travel by private vehicle        Equipment Recommendations Other (comment) (TBD)  Recommendations for Other Services       Functional Status Assessment Patient has had a recent decline in their functional status and demonstrates the ability to make  significant improvements in function in a reasonable and predictable amount of time.     Precautions / Restrictions Precautions Precautions: Fall Restrictions Weight Bearing Restrictions: No      Mobility  Bed Mobility Overal bed mobility: Needs Assistance Bed Mobility: Supine to Sit     Supine to sit: Mod assist, HOB elevated, Used rails          Transfers Overall transfer level: Needs assistance Equipment used: Rolling walker (2 wheels) Transfers: Sit to/from Stand, Bed to chair/wheelchair/BSC Sit to Stand: Min assist   Step pivot transfers: Min assist       General transfer comment: unable to perform with just CGA, minA for each attempt. minA for RW assist and occasional steadying with step pivot to recliner    Ambulation/Gait               General Gait Details: dferred, pt with incontinence, breakfast at bedside, pain signs/symptoms  Stairs            Wheelchair Mobility     Tilt Bed    Modified Rankin (Stroke Patients Only)       Balance Overall balance assessment: Needs assistance Sitting-balance support: Feet supported Sitting balance-Leahy Scale: Fair     Standing balance support: Bilateral upper extremity supported Standing balance-Leahy Scale: Poor                               Pertinent Vitals/Pain Pain Assessment Pain Assessment: Faces Faces Pain Scale: Hurts even more Pain Location: grimaces/groans with mobility but did not truly confirm that it was her  back that was hurting her Pain Descriptors / Indicators: Moaning, Guarding, Grimacing Pain Intervention(s): Limited activity within patient's tolerance, Monitored during session, Repositioned    Home Living Family/patient expects to be discharged to:: Private residence Living Arrangements: Children Available Help at Discharge: Family;Available 24 hours/day Type of Home: House Home Access: Stairs to enter Entrance Stairs-Rails: None Entrance Stairs-Number  of Steps: 2   Home Layout: One level Home Equipment: Rollator (4 wheels)      Prior Function Prior Level of Function : Needs assist             Mobility Comments: per pt her daughter helps her with walking ADLs Comments: pt nods when asked if family assists with ADLs     Extremity/Trunk Assessment   Upper Extremity Assessment Upper Extremity Assessment: Generalized weakness    Lower Extremity Assessment Lower Extremity Assessment: Generalized weakness       Communication   Communication Communication: Hearing impairment  Cognition Arousal: Alert Behavior During Therapy: WFL for tasks assessed/performed Overall Cognitive Status: History of cognitive impairments - at baseline                                 General Comments: pt oriented to self only        General Comments      Exercises     Assessment/Plan    PT Assessment Patient needs continued PT services  PT Problem List Decreased strength;Pain;Decreased activity tolerance;Decreased balance;Decreased mobility;Decreased knowledge of precautions;Decreased knowledge of use of DME;Decreased range of motion       PT Treatment Interventions DME instruction;Neuromuscular re-education;Gait training;Stair training;Patient/family education;Functional mobility training;Therapeutic activities;Therapeutic exercise;Balance training    PT Goals (Current goals can be found in the Care Plan section)  Acute Rehab PT Goals PT Goal Formulation: Patient unable to participate in goal setting Time For Goal Achievement: 08/13/23 Potential to Achieve Goals: Fair    Frequency Min 1X/week     Co-evaluation               AM-PAC PT "6 Clicks" Mobility  Outcome Measure Help needed turning from your back to your side while in a flat bed without using bedrails?: A Little Help needed moving from lying on your back to sitting on the side of a flat bed without using bedrails?: A Little Help needed moving to  and from a bed to a chair (including a wheelchair)?: A Little Help needed standing up from a chair using your arms (e.g., wheelchair or bedside chair)?: A Little Help needed to walk in hospital room?: A Lot Help needed climbing 3-5 steps with a railing? : Total 6 Click Score: 15    End of Session Equipment Utilized During Treatment: Back brace (TLSO) Activity Tolerance: Patient tolerated treatment well Patient left: with call bell/phone within reach;with chair alarm set;in chair;with nursing/sitter in room Nurse Communication: Mobility status PT Visit Diagnosis: Other abnormalities of gait and mobility (R26.89);Muscle weakness (generalized) (M62.81);Pain Pain - Right/Left:  (midline) Pain - part of body:  (back)    Time: 1610-9604 PT Time Calculation (min) (ACUTE ONLY): 18 min   Charges:   PT Evaluation $PT Eval Moderate Complexity: 1 Mod PT Treatments $Therapeutic Activity: 8-22 mins PT General Charges $$ ACUTE PT VISIT: 1 Visit        Olga Coaster PT, DPT 2:00 PM,07/30/23

## 2023-07-30 NOTE — Progress Notes (Signed)
PROGRESS NOTE    Lisa Sosa  ONG:[id number] DOB: 1923-04-23 DOA: 07/29/2023 PCP: Glori Luis, MD  144A/144A-AA  LOS: 0 days   Brief hospital course:   Assessment & Plan: Lisa Sosa is a 89 y.o. female with medical history significant of glucoma, stage III diffuse large B lymphoma status post chemotherapy in 2016, DVT and PE of anticoagulation (2016-18), chronic ambulation dysfunction, protein by daughter for a mechanical fall at home and persistent back pain.    L1 vertebral body compression fracture -Secondary to mechanical fall -Considered to be a noncandidate for surgical intervention given her age and overall conditions. --started on Vit D supplement -TLSO when out of bed --oxycodone PRN --PT   Glucoma -Stable, continue current home regimen of eyedrops.   DVT prophylaxis: Lovenox SQ Code Status: DNR  Family Communication:  Level of care: Med-Surg Dispo:   The patient is from: home Anticipated d/c is to: to be determined Anticipated d/c date is: pending disposition   Subjective and Interval History:  Pt reported pain when moving around.   Objective: Vitals:   07/29/23 1614 07/30/23 0013 07/30/23 0814 07/30/23 1544  BP: (!) 154/60 (!) 148/57 (!) 163/57 (!) 115/48  Pulse: 61 63 61 (!) 59  Resp: 16 18 15 15   Temp: 98.9 F (37.2 C) 98.1 F (36.7 C) (!) 97.3 F (36.3 C) (!) 97.5 F (36.4 C)  SpO2: 96% 100% 99% 100%  Weight:      Height:        Intake/Output Summary (Last 24 hours) at 07/30/2023 2245 Last data filed at 07/30/2023 1500 Gross per 24 hour  Intake 360 ml  Output --  Net 360 ml   Filed Weights   07/29/23 1206  Weight: 51.3 kg    Examination:   Constitutional: NAD, AAOx2 HEENT: conjunctivae and lids normal, EOMI CV: No cyanosis.   RESP: normal respiratory effort, on RA   Data Reviewed: I have personally reviewed labs and imaging studies  Time spent: 35 minutes  Darlin Priestly, MD [facility] If 7PM-7AM, please  contact night-coverage 07/30/2023, 10:45 PM

## 2023-07-30 NOTE — Care Management Obs Status (Signed)
MEDICARE OBSERVATION STATUS NOTIFICATION   Patient Details  Name: Lisa Sosa MRN: [id number] Date of Birth: 26-Dec-1922   Medicare Observation Status Notification Given:  Yes    Marlowe Sax, RN 07/30/2023, 12:02 PM

## 2023-07-30 NOTE — Plan of Care (Signed)
[facility] Tonsillectomy/Adenoidectomy, Postoperative PEDS care plan entered in error.

## 2023-07-31 DIAGNOSIS — S32010A Wedge compression fracture of first lumbar vertebra, initial encounter for closed fracture: Secondary | ICD-10-CM | POA: Diagnosis not present

## 2023-07-31 LAB — GLUCOSE, CAPILLARY: Glucose-Capillary: 101 mg/dL — ABNORMAL HIGH (ref 70–99)

## 2023-07-31 MED ORDER — ACETAMINOPHEN 500 MG PO TABS
1000.0000 mg | ORAL_TABLET | Freq: Three times a day (TID) | ORAL | Status: DC
  Administered 2023-07-31 – 2023-08-05 (×15): 1000 mg via ORAL
  Filled 2023-07-31 (×15): qty 2

## 2023-07-31 NOTE — TOC Progression Note (Signed)
Transition of Care [facility]) - Progression Note    Patient Details  Name: Lisa Sosa MRN: [id number] Date of Birth: 06-06-1923  Transition of Care [facility]) CM/SW Contact  Susa Simmonds, [place] Phone Number: 07/31/2023, 1:04 PM  Clinical Narrative:   CSW spoke with patients daughter [name] who agrees with the recommendations for SNF rehab. CSW will start bed search    Expected Discharge Plan: Home w Home Health Services Barriers to Discharge: No Barriers Identified  Expected Discharge Plan and Services   Discharge Planning Services: CM Consult   Living arrangements for the past 2 months: Single Family Home                 DME Arranged: N/A DME Agency: NA       HH Arranged: PT HH Agency: [facility] Date HH Agency Contacted: 07/30/23 Time HH Agency Contacted: 1200 Representative spoke with at [facility] Agency: Coralee North   Social Determinants of Health (SDOH) Interventions SDOH Screenings   Food Insecurity: No Food Insecurity (01/28/2023)  Housing: Low Risk  (01/28/2023)  Transportation Needs: No Transportation Needs (01/28/2023)  Utilities: Not At Risk (01/28/2023)  Depression (PHQ2-9): Low Risk  (01/28/2023)  Financial Resource Strain: Low Risk  (01/28/2023)  Social Connections: Unknown (01/28/2023)  Stress: No Stress Concern Present (01/28/2023)  Tobacco Use: Low Risk  (07/29/2023)    Readmission Risk Interventions     No data to display

## 2023-07-31 NOTE — Progress Notes (Signed)
PROGRESS NOTE  Lisa Sosa  DOB: 23-Nov-1922  PCP: Glori Luis, MD WUJ:[id number]  DOA: 07/29/2023  LOS: 0 days  Hospital Day: 3  Brief narrative: Lisa Sosa is a 89 y.o. 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 10/17, patient was brought to the ED by EMS from home after an unwitnessed fall.  Per report, family heard her fall in the other room while she was trying to get out of the bed.  Since then, patient was able to stand up and ambulate but noticed right sided back pain which worsened and hence presented to the ED. Skeletal survey done in the ED. CT lumbar spine showed an acute appearing compression fracture of the L1 vertebral body with moderate height loss.  Also showed multilevel lumbar spondylosis, worst at L3-4, where there is severe spinal canal stenosis. At least moderate spinal canal stenosis at L2-3 and L4-5. Admitted to [facility] Neurosurgery was consulted.  Subjective: Patient was seen and examined this morning.  Pleasant elderly Caucasian female.  Sitting up in chair.  Nurse tech helping with breakfast.  Hard of hearing.  Not in pain unless she moves. Chart reviewed In the last 24 hours, afebrile, heart rate in 50s and 60s, blood pressure 140s this morning, breathing room air Labs from morning with vitamin D low at 17  Assessment and plan: L1 vertebral body compression fracture Secondary to mechanical fall Neurosurgery consult appreciated. Considered to be a noncandidate for surgical intervention given her age and overall conditions.  Started on TLSO brace when out of bed For pain control, start Tylenol scheduled 1 g 3 times daily, continue as needed oxycodone. PT eval obtained, SNF recommended  Vitamin D deficiency Vitamin D level significantly low  at 17.  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  Goals of care   Code Status: Limited: Do not attempt resuscitation (DNR) -DNR-LIMITED -Do Not Intubate/DNI      DVT prophylaxis:  enoxaparin (LOVENOX) injection 30 mg Start: 07/29/23 2200   Antimicrobials: None Fluid: None Consultants: None currently Family Communication: None at bedside  Status: Observation Level of care:  Med-Surg   Patient is from: home Needs to continue in-hospital care: Pain management Anticipated d/c to: Home with home health versus SNF      Diet:  Diet Order             Diet regular Room service appropriate? Yes; Fluid consistency: Thin  Diet effective now                   Scheduled Meds:  acetaminophen  1,000 mg Oral TID   brimonidine  1 drop Both Eyes BID   calcium carbonate  1 tablet Oral BID WC   docusate sodium  100 mg Oral BID   dorzolamide-timolol  1 drop Both Eyes BID   enoxaparin (LOVENOX) injection  30 mg Subcutaneous Q24H   latanoprost  1 drop Both Eyes QHS   lidocaine  1 patch Transdermal Q24H   Vitamin D (Ergocalciferol)  50,000 Units Oral Q7 days    PRN meds: ondansetron **OR** ondansetron (ZOFRAN) IV, oxyCODONE, senna-docusate   Infusions:    Antimicrobials: Anti-infectives (From admission, onward)    None       Objective: Vitals:   07/30/23 2341 07/31/23  0824  BP: (!) 131/53 (!) 144/53  Pulse: (!) 58 62  Resp: 16 16  Temp: 97.7 F (36.5 C) 97.9 F (36.6 C)  SpO2: 99%     Intake/Output Summary (Last 24 hours) at 07/31/2023 1104 Last data filed at 07/30/2023 2353 Gross per 24 hour  Intake 360 ml  Output 1 ml  Net 359 ml   Filed Weights   07/29/23 1206  Weight: 51.3 kg   Weight change:  Body mass index is 18.25 kg/m.   Physical Exam: General exam: Pleasant, elderly.  Not in pain at rest Skin: No rashes, lesions or ulcers. HEENT: Atraumatic, normocephalic, no obvious bleeding Lungs:  Clear to auscultation bilaterally CVS: Regular rate and rhythm, no murmur GI/Abd soft, nontender, nondistended, bowel sound present CNS: Alert, awake, oriented x 3.  Hard of hearing Psychiatry: Mood appropriate Extremities: No pedal edema, no calf tenderness  Data Review: I have personally reviewed the laboratory data and studies available.  F/u labs ordered Unresulted Labs (From admission, onward)    None       Total time spent in review of labs and imaging, patient evaluation, formulation of plan, documentation and communication with family: 45 minutes  Signed, Lorin Glass, MD [facility] 07/31/2023

## 2023-07-31 NOTE — Evaluation (Signed)
Occupational Therapy Evaluation Patient Details Name: Lisa Sosa MRN: [id number] DOB: 1923-05-07 Today's Date: 07/31/2023   History of Present Illness Pt is a 89 yo female that had a fall at home, noted for L1 compression fx, TLSO ordered. 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   Clinical Impression   Pt was seen for OT evaluation this date. Pt was greeted sitting in recliner with daughter present. Pt was alert ?orientation,  HOH, will continue to assess. Prior to hospital admission, Pt was living with her daughter, requiring PRN assist for feeding, grooming, dressing; assist with bathing at the sink; assist for all IADLs. PTA pt amb house hold distances using rollator, amb into bathroom, holding on to walls/furniture because rollator does not fit through bathroom door.  Pt presents to acute OT demonstrating impaired ADL performance and functional mobility 2/2 (See OT problem list for additional functional deficits). In chair with legs down, BP 88/42 (MAP 56) HR in the 50s bpm. BP up to 105/40 (MAP 60) HR 56 bpm with legs up in the chair. Pt currently requires MOD + 2 and RW for STS. On this date Pt mobility limited due to low BP per above measurements, will continue to assess ADL function and mobility on next available date. TLSO donned throughout mobility. Pt ate ice cream with supervision at end of session. Daughter stated Pt has had a decreased appetite and not drinking much fluids. Educated Pt daughter on donning/doffing TLSO, and rehab options post d/c. Pt would benefit from skilled OT services to address noted impairments and functional limitations (see below for any additional details) in order to maximize safety and independence while minimizing falls risk and caregiver burden. OT will follow acutely.       If plan is discharge home, recommend the following: Assistance  with cooking/housework;Assist for transportation;Direct supervision/assist for financial management;Supervision due to cognitive status;Direct supervision/assist for medications management;Help with stairs or ramp for entrance;A lot of help with bathing/dressing/bathroom;A lot of help with walking and/or transfers    Functional Status Assessment  Patient has had a recent decline in their functional status and demonstrates the ability to make significant improvements in function in a reasonable and predictable amount of time.  Equipment Recommendations  Other (comment) (Next venue of care)    Recommendations for Other Services       Precautions / Restrictions Precautions Precautions: Fall Required Braces or Orthoses: Spinal Brace Spinal Brace: Thoracolumbosacral orthotic Restrictions Weight Bearing Restrictions: No Other Position/Activity Restrictions: per neurosurgery note: "TLSO brace which she can use for comfort.  This does not need to be on while she is in bed or resting"      Mobility Bed Mobility               General bed mobility comments: NT Pt in recliner pre/post eval    Transfers Overall transfer level: Needs assistance Equipment used: Rolling walker (2 wheels) Transfers: Sit to/from Stand Sit to Stand: MOD +2 with RW                  Balance Overall balance assessment: Needs assistance Sitting-balance support: Feet supported Sitting balance-Leahy Scale: Fair     Standing balance support: Bilateral upper extremity supported, Reliant on assistive device for balance Standing balance-Leahy Scale: Poor Standing balance comment: Will continue to assess; mobility limited on this date due to low BP.  ADL either performed or assessed with clinical judgement   ADL Overall ADL's : Needs assistance/impaired Eating/Feeding: Supervision/ safety   Grooming: Wash/dry face           Upper Body Dressing : Maximal  assistance;Sitting Upper Body Dressing Details (indicate cue type and reason): TLSO                   General ADL Comments: Standing ADL tasks limited on this date due to low BP.     Vision Baseline Vision/History: 1 Wears glasses       Perception         Praxis         Pertinent Vitals/Pain Pain Assessment Pain Assessment: PAINAD Faces Pain Scale: No hurt Breathing: normal Negative Vocalization: none Facial Expression: smiling or inexpressive Body Language: relaxed Consolability: no need to console PAINAD Score: 0 Pain Location: Daughter stats she has pain with mobility but manageable during rest Pain Intervention(s): Monitored during session, Limited activity within patient's tolerance     Extremity/Trunk Assessment Upper Extremity Assessment Upper Extremity Assessment: Generalized weakness   Lower Extremity Assessment Lower Extremity Assessment: Generalized weakness       Communication Communication Communication: Hearing impairment Cueing Techniques: Verbal cues;Tactile cues;Gestural cues   Cognition Arousal: Alert Behavior During Therapy: WFL for tasks assessed/performed Overall Cognitive Status: History of cognitive impairments - at baseline Area of Impairment: Following commands, Attention, Problem solving                   Current Attention Level: Sustained   Following Commands: Follows one step commands with increased time     Problem Solving: Decreased initiation, Slow processing General Comments: Often need to repeat steps due to hearing loss     General Comments       Exercises Other Exercises Other Exercises: Edu: Role of OT, role of rehab, family d/c recomendation discussion, TLSO donn/doff family edu   Shoulder Instructions      Home Living Family/patient expects to be discharged to:: Private residence Living Arrangements: Children Available Help at Discharge: Family;Available 24 hours/day Type of Home: House Home  Access: Stairs to enter [employer] of Steps: 2 Entrance Stairs-Rails: None Home Layout: One level     Bathroom Shower/Tub: Sponge bathes at baseline;Tub/shower unit   Teacher, early years/pre: No   Home Equipment: Rollator (4 wheels);BSC/3in1          Prior Functioning/Environment Prior Level of Function : Needs assist             Mobility Comments: amb household distances with rollator ADLs Comments: set up for feeding, grooming, dressing; assist with bathing at the sink; assist for all IADLs        OT Problem List: Decreased coordination;Decreased activity tolerance;Impaired balance (sitting and/or standing);Decreased knowledge of use of DME or AE      OT Treatment/Interventions: Self-care/ADL training;Therapeutic exercise;Therapeutic activities;Energy conservation;Patient/family education;Balance training;DME and/or AE instruction    OT Goals(Current goals can be found in the care plan section) Acute Rehab OT Goals Patient Stated Goal: to get stronger and have less pain OT Goal Formulation: With patient/family Time For Goal Achievement: 08/14/23 Potential to Achieve Goals: Good ADL Goals Pt Will Perform Grooming: (P) with supervision;sitting;standing Pt Will Perform Lower Body Dressing: (P) with supervision;sit to/from stand Pt Will Transfer to Toilet: (P) with supervision;ambulating Pt Will Perform Toileting - Clothing Manipulation and hygiene: (P) with supervision;sitting/lateral leans  OT Frequency: Min 1X/week    Co-evaluation  AM-PAC OT "6 Clicks" Daily Activity     Outcome Measure Help from another person eating meals?: A Little Help from another person taking care of personal grooming?: A Lot Help from another person toileting, which includes using toliet, bedpan, or urinal?: A Lot Help from another person bathing (including washing, rinsing, drying)?: A Lot Help from another person to put on and  taking off regular upper body clothing?: A Lot Help from another person to put on and taking off regular lower body clothing?: A Lot 6 Click Score: 13   End of Session Equipment Utilized During Treatment: Gait belt;Rolling walker (2 wheels);Back brace Nurse Communication: Mobility status  Activity Tolerance: Patient tolerated treatment well Patient left: in chair;with call bell/phone within reach;with chair alarm set;with family/visitor present  OT Visit Diagnosis: Other abnormalities of gait and mobility (R26.89);Muscle weakness (generalized) (M62.81);History of falling (Z91.81)                Time: 1610-9604 OT Time Calculation (min): 26 min Charges:  OT General Charges $OT Visit: 1 Visit OT Evaluation $OT Eval Moderate Complexity: 1 Mod  [employer], OTS

## 2023-07-31 NOTE — [place] FL2 (Signed)
[place] MEDICAID FL2 LEVEL OF CARE FORM     IDENTIFICATION  Patient Name: Lisa Sosa Birthdate: [birth year] Sex: female Admission Date (Current Location): 07/29/2023  [facility] and [place] Number:  Chiropodist and Address:  [facility], [address], [place], [place] [address]      Provider Number: [id number]  Attending Physician Name and Address:  Lorin Glass, MD  Relative Name and Phone Number:  [place],Naomi Murray [facility]) (Daughter)  [phone]    Current Level of Care: Hospital Recommended Level of Care: Skilled Nursing Facility Prior Approval Number:    Date Approved/Denied:   PASRR Number: [id number]  Discharge Plan: SNF    Current Diagnoses: Patient Active Problem List   Diagnosis Date Noted   Fall 07/30/2023   Closed compression fracture of body of L1 vertebra (HCC) 07/29/2023   Fracture of lumbar vertebra, compression (HCC) 07/29/2023   COVID-19 virus infection 10/08/2021   Hearing difficulty of both ears 09/19/2021   Balance problem 09/19/2021   Memory difficulty 09/19/2021   Orthostasis 09/19/2021   Great toe pain, right 11/27/2020   Neck stiffness 05/03/2018   Encounter for completion of form with patient 05/03/2018   Depression, major, single episode, mild (HCC) 02/10/2017   Weight loss 02/10/2017   Right shoulder pain 02/10/2017   Cervical spondylosis without myelopathy 01/21/2017   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) 12/03/2016   Acquired trigger finger 12/01/2016   Bilateral lower extremity edema 05/13/2016   History of pulmonary embolus (PE) 02/10/2016   Diverticulitis of colon 02/10/2016   Neuropathy 11/13/2015   Prediabetes 11/13/2015   Conjunctivitis 11/13/2015   Personal history of bladder cancer 01/10/2014   Acute cystitis 11/01/2012   Calculus of kidney 11/01/2012   Anomalies of urachus, 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   Malaise and fatigue 11/01/2012   Cancer of lateral wall of urinary bladder  (HCC) 11/01/2012   Hematuria, microscopic 11/01/2012   Excessive urination at night 11/01/2012   Malignant neoplasm of lateral wall of urinary bladder (HCC) 11/01/2012   Microscopic hematuria 11/01/2012   Urge incontinence of urine 11/01/2012   Chronic kidney disease, stage II (mild) 11/01/2012   Dysuria 11/01/2012   Incomplete emptying of bladder 11/01/2012   Methicillin resistant Staphylococcus aureus infection 11/01/2012   Urinary tract infection 11/01/2012    Orientation RESPIRATION BLADDER Height & Weight     Self, Place  Normal Continent Weight: 113 lb 1.5 oz (51.3 kg) Height:  5\' 6"  (167.6 cm)  BEHAVIORAL SYMPTOMS/MOOD NEUROLOGICAL BOWEL NUTRITION STATUS      Continent Diet (Regular)  AMBULATORY STATUS COMMUNICATION OF NEEDS Skin   Extensive Assist Verbally Normal                       Personal Care Assistance Level of Assistance  Bathing, Feeding, Dressing Bathing Assistance: Limited assistance Feeding assistance: Limited assistance Dressing Assistance: Limited assistance     Functional Limitations Info  Sight, Hearing, Speech Sight Info: Adequate Hearing Info: Impaired (Hard of hearing) Speech Info: Adequate    SPECIAL CARE FACTORS FREQUENCY  PT (By licensed PT), OT (By licensed OT)     PT Frequency: 5x weekly OT Frequency: 5x weekly            Contractures Contractures Info: Not present    Additional Factors Info  Code Status, Allergies Code Status Info: DNR-Limited Allergies Info: Iron, Codeine           Current Medications (07/31/2023):  This is the  current hospital active medication list Current Facility-Administered Medications  Medication Dose Route Frequency Provider Last Rate Last Admin   acetaminophen (TYLENOL) tablet 1,000 mg  1,000 mg Oral TID Lorin Glass, MD   1,000 mg at 07/31/23 1033   brimonidine (ALPHAGAN) 0.2 % ophthalmic solution 1 drop  1 drop Both Eyes BID Mikey College T, MD   1 drop at 07/31/23 0857   calcium  carbonate (OS-CAL - dosed in mg of elemental calcium) tablet 1,250 mg  1 tablet Oral BID WC Mikey College T, MD   1,250 mg at 07/31/23 0856   docusate sodium (COLACE) capsule 100 mg  100 mg Oral BID Mikey College T, MD   100 mg at 07/31/23 0857   dorzolamide-timolol (COSOPT) 2-0.5 % ophthalmic solution 1 drop  1 drop Both Eyes BID Mikey College T, MD   1 drop at 07/31/23 0855   enoxaparin (LOVENOX) injection 30 mg  30 mg Subcutaneous Q24H Mikey College T, MD   30 mg at 07/30/23 2125   latanoprost (XALATAN) 0.005 % ophthalmic solution 1 drop  1 drop Both Eyes QHS Mikey College T, MD   1 drop at 07/30/23 2125   lidocaine (LIDODERM) 5 % 1 patch  1 patch Transdermal Q24H Concha Se, MD   1 patch at 07/31/23 1222   ondansetron (ZOFRAN) tablet 4 mg  4 mg Oral Q6H PRN Mikey College T, MD       Or   ondansetron [facility]) injection 4 mg  4 mg Intravenous Q6H PRN Mikey College T, MD       oxyCODONE (Oxy IR/ROXICODONE) immediate release tablet 5 mg  5 mg Oral Q4H PRN Mikey College T, MD       senna-docusate (Senokot-S) tablet 1 tablet  1 tablet Oral QHS PRN Emeline General, MD   1 tablet at 07/29/23 2112   Vitamin D (Ergocalciferol) (DRISDOL) 1.25 MG (50000 UNIT) capsule 50,000 Units  50,000 Units Oral Q7 days Emeline General, MD   50,000 Units at 07/29/23 1704     Discharge Medications: Please see discharge summary for a list of discharge medications.  Relevant Imaging Results:  Relevant Lab Results:   Additional Information SSN: [id number]  Susa Simmonds, LCSWA

## 2023-08-01 DIAGNOSIS — S32010A Wedge compression fracture of first lumbar vertebra, initial encounter for closed fracture: Secondary | ICD-10-CM | POA: Diagnosis not present

## 2023-08-01 MED ORDER — MIDODRINE HCL 5 MG PO TABS
2.5000 mg | ORAL_TABLET | Freq: Three times a day (TID) | ORAL | Status: DC
  Administered 2023-08-01 – 2023-08-05 (×12): 2.5 mg via ORAL
  Filled 2023-08-01 (×12): qty 1

## 2023-08-01 NOTE — Plan of Care (Signed)

## 2023-08-01 NOTE — Plan of Care (Signed)
  Problem: Clinical Measurements: Goal: Will remain free from infection Outcome: Progressing   Problem: Pain Managment: Goal: General experience of comfort will improve Outcome: Progressing   Problem: Safety: Goal: Ability to remain free from injury will improve Outcome: Progressing   

## 2023-08-01 NOTE — Progress Notes (Signed)
PROGRESS NOTE  [name]  DOB: 07/09/1923  PCP: Glori Luis, MD XBM:[id number]  DOA: 07/29/2023  LOS: 0 days  Hospital Day: 4  Brief narrative: Lisa Sosa is a 89 y.o. 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 10/17, patient was brought to the ED by EMS from home after an unwitnessed fall.  Per report, family heard her fall in the other room while she was trying to get out of the bed.  Since then, patient was able to stand up and ambulate but noticed right sided back pain which worsened and hence presented to the ED. Skeletal survey done in the ED. CT lumbar spine showed an acute appearing compression fracture of the L1 vertebral body with moderate height loss.  Also showed multilevel lumbar spondylosis, worst at L3-4, where there is severe spinal canal stenosis. At least moderate spinal canal stenosis at L2-3 and L4-5. Admitted to [facility] Neurosurgery was consulted.  Subjective: Patient was seen and examined this morning.   Pleasant elderly Caucasian female.  Lying on bed.  Multiple family members at bedside. Not in distress. On ambulation with nurse tech, patient felt ill yesterday.  Daughter talks about chronic dizziness from orthostatic hypotension.  Assessment and plan: L1 vertebral body compression fracture Secondary to mechanical fall Neurosurgery consult appreciated. Considered to be a noncandidate for surgical intervention given her age and overall conditions.  Started on TLSO brace when out of bed For pain control, start Tylenol scheduled 1 g 3 times daily, continue as needed oxycodone. PT eval obtained, SNF recommended  Orthostatic hypotension Patient has chronic orthostatic hypotension and it might have been the reason of her fall. 10/18, blood  pressure dropped from 105/40 to 88/42 just on keeping her legs up versus down in the chair.  She was dizzy to be transferred from bed to chair. Start midodrine 2.5 mg daily.  Vitamin D deficiency Vitamin D level significantly low at 17.  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  Goals of care   Code Status: Limited: Do not attempt resuscitation (DNR) -DNR-LIMITED -Do Not Intubate/DNI      DVT prophylaxis:  enoxaparin (LOVENOX) injection 30 mg Start: 07/29/23 2200   Antimicrobials: None Fluid: None Consultants: None currently Family Communication: None at bedside  Status: Observation Level of care:  Med-Surg   Patient is from: home Needs to continue in-hospital care: Pending SNF.    Diet:  Diet Order             Diet regular Room service appropriate? Yes; Fluid consistency: Thin  Diet effective now                   Scheduled Meds:  acetaminophen  1,000 mg Oral TID   brimonidine  1 drop Both Eyes BID   calcium carbonate  1 tablet Oral BID WC   docusate sodium  100 mg Oral BID   dorzolamide-timolol  1 drop Both Eyes BID   enoxaparin (LOVENOX) injection  30 mg Subcutaneous Q24H   latanoprost  1 drop Both Eyes QHS   lidocaine  1 patch Transdermal Q24H   midodrine  2.5 mg Oral TID WC   Vitamin D (Ergocalciferol)  50,000 Units Oral Q7 days    PRN meds: ondansetron **OR** ondansetron (ZOFRAN) IV,  oxyCODONE, senna-docusate   Infusions:    Antimicrobials: Anti-infectives (From admission, onward)    None       Objective: Vitals:   08/01/23 0339 08/01/23 0755  BP: (!) 121/54 (!) 139/59  Pulse: (!) 57 64  Resp: 18 16  Temp: 97.9 F (36.6 C) 97.7 F (36.5 C)  SpO2: 100% 99%    Intake/Output Summary (Last 24 hours) at 08/01/2023 1434 Last data filed at 08/01/2023 1138 Gross per 24 hour  Intake 0 ml  Output --  Net 0 ml   Filed Weights   07/29/23 1206  Weight: 51.3 kg   Weight change:  Body  mass index is 18.25 kg/m.   Physical Exam: General exam: Pleasant, elderly.  Not in pain at rest Skin: No rashes, lesions or ulcers. HEENT: Atraumatic, normocephalic, no obvious bleeding Lungs: Clear to auscultation bilaterally CVS: Regular rate and rhythm, no murmur GI/Abd soft, nontender, nondistended, bowel sound present CNS: Alert, awake, oriented x 3.  Hard of hearing Psychiatry: Mood appropriate.  Cheerful Extremities: No pedal edema, no calf tenderness  Data Review: I have personally reviewed the laboratory data and studies available.  F/u labs ordered Unresulted Labs (From admission, onward)    None       Total time spent in review of labs and imaging, patient evaluation, formulation of plan, documentation and communication with family: 45 minutes  Signed, Lorin Glass, MD [facility] 08/01/2023

## 2023-08-02 DIAGNOSIS — S32010A Wedge compression fracture of first lumbar vertebra, initial encounter for closed fracture: Secondary | ICD-10-CM | POA: Diagnosis not present

## 2023-08-02 NOTE — Progress Notes (Signed)
Physical Therapy Treatment Patient Details Name: Lisa Sosa MRN: [id number] DOB: [birth year] Today's Date: 08/02/2023   History of Present Illness Pt is a 89 yo female that had a fall at home, noted for L1 compression fx, TLSO ordered. 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    PT Comments  Patient seated in chair on arrival to room. She reports no dizziness with mobility today. Assistance required for donning TLSO, for standing, and for ambulation. Patient walked 75ft with rolling walker with Min A required for steadying and occasional rolling walker negotiation. Reinforcement of rolling walker positioning and to avoid trunk flexion while walking. Activity tolerance limited by fatigue. Recommend to continue PT to maximize independence and decrease caregiver burden. Anticipate patient will benefit from continued rehabilitation after this hospital stay < 3 hours/day.    If plan is discharge home, recommend the following: A little help with bathing/dressing/bathroom;Assistance with cooking/housework;Supervision due to cognitive status;Assist for transportation;Assistance with feeding;A lot of help with walking and/or transfers;Help with stairs or ramp for entrance;Direct supervision/assist for medications management   Can travel by private vehicle     No  Equipment Recommendations   (to be determined at next level of care)    Recommendations for Other Services       Precautions / Restrictions Precautions Precautions: Fall Required Braces or Orthoses: Spinal Brace Spinal Brace: Thoracolumbosacral orthotic Restrictions Weight Bearing Restrictions: No     Mobility  Bed Mobility               General bed mobility comments: not observed as patient sitting up on arrival and post session    Transfers Overall transfer level: Needs assistance Equipment used: Rolling  walker (2 wheels) Transfers: Sit to/from Stand Sit to Stand: Min assist           General transfer comment: TLSO donned while sitting in chair prior to standing. lifting assistance required for standing. verbal cues for hand placement for safety    Ambulation/Gait Ambulation/Gait assistance: Min assist Gait Distance (Feet): 8 Feet Assistive device: Rolling walker (2 wheels) Gait Pattern/deviations: Step-to pattern, Trunk flexed, Narrow base of support Gait velocity: decreased     General Gait Details: patient was incontinent of urine with standing. steadying assistance with occasional assistance for rolling walker negotiation required for short distance ambulation. reinforcement of standing closer to base of support and to avoid trunk flexion with ambulation. activity tolerance limited by fatigue with no dizziness reported   Stairs             Wheelchair Mobility     Tilt Bed    Modified Rankin (Stroke Patients Only)       Balance Overall balance assessment: Needs assistance Sitting-balance support: Feet supported Sitting balance-Leahy Scale: Fair     Standing balance support: Bilateral upper extremity supported, Reliant on assistive device for balance Standing balance-Leahy Scale: Poor Standing balance comment: external support required with heavy reliance on rolling walker for support                            Cognition Arousal: Alert Behavior During Therapy: WFL for tasks assessed/performed Overall Cognitive Status: History of cognitive impairments - at baseline  General Comments: patient is able to follow single step commands with increased time. she is [facility]        Exercises      General Comments        Pertinent Vitals/Pain Pain Assessment Pain Assessment: No/denies pain    Home Living                          Prior Function            PT Goals (current goals can now  be found in the care plan section) Acute Rehab PT Goals Patient Stated Goal: to get better PT Goal Formulation: With patient Time For Goal Achievement: 08/13/23 Potential to Achieve Goals: Fair Progress towards PT goals: Progressing toward goals    Frequency    Min 1X/week      PT Plan      Co-evaluation              AM-PAC PT "6 Clicks" Mobility   Outcome Measure  Help needed turning from your back to your side while in a flat bed without using bedrails?: A Little Help needed moving from lying on your back to sitting on the side of a flat bed without using bedrails?: A Little Help needed moving to and from a bed to a chair (including a wheelchair)?: A Little Help needed standing up from a chair using your arms (e.g., wheelchair or bedside chair)?: A Little Help needed to walk in hospital room?: A Lot Help needed climbing 3-5 steps with a railing? : Total 6 Click Score: 15    End of Session Equipment Utilized During Treatment: Back brace Activity Tolerance: Patient tolerated treatment well Patient left: in chair;with call bell/phone within reach;with chair alarm set;with family/visitor present (daughter present) Nurse Communication: Mobility status PT Visit Diagnosis: Other abnormalities of gait and mobility (R26.89);Muscle weakness (generalized) (M62.81);Pain     Time: 4034-7425 PT Time Calculation (min) (ACUTE ONLY): 18 min  Charges:    $Therapeutic Activity: 8-22 mins PT General Charges $$ ACUTE PT VISIT: 1 Visit                     Donna Bernard, PT, MPT    Ina Homes 08/02/2023, 1:48 PM

## 2023-08-02 NOTE — Plan of Care (Signed)

## 2023-08-02 NOTE — TOC Progression Note (Signed)
Transition of Care [facility]) - Progression Note    Patient Details  Name: Lisa Sosa MRN: [id number] Date of Birth: 27-Dec-1922  Transition [facility]) CM/SW Contact  Marlowe Sax, RN Phone Number: 08/02/2023, 4:09 PM  Clinical Narrative:    Spoke with the patient ans her daughter in the room and reviewed the bed offers they chose [facility] I notified Tiffany at [facility], I called [employer] and left a secure VM for [place] asking for [employer] and EMS auth  Expected Discharge Plan: Home w Home Health Services Barriers to Discharge: No Barriers Identified  Expected Discharge Plan and Services   Discharge Planning Services: CM Consult   Living arrangements for the past 2 months: Single Family Home                 DME Arranged: N/A DME Agency: NA       HH Arranged: PT HH Agency: [facility] Date [facility] Agency Contacted: 07/30/23 Time HH Agency Contacted: 1200 Representative spoke with at [facility] Agency: Coralee North   Social Determinants of Health (SDOH) Interventions SDOH Screenings   Food Insecurity: No Food Insecurity (01/28/2023)  Housing: Low Risk  (01/28/2023)  Transportation Needs: No Transportation Needs (01/28/2023)  Utilities: Not At Risk (01/28/2023)  Depression (PHQ2-9): Low Risk  (01/28/2023)  Financial Resource Strain: Low Risk  (01/28/2023)  Social Connections: Unknown (01/28/2023)  Stress: No Stress Concern Present (01/28/2023)  Tobacco Use: Low Risk  (07/29/2023)    Readmission Risk Interventions     No data to display

## 2023-08-02 NOTE — Progress Notes (Addendum)
PROGRESS NOTE  LOUCINDA WORKU  DOB: 1923/09/30  PCP: Glori Luis, MD BJY:[id number]  DOA: 07/29/2023  LOS: 0 days  Hospital Day: 5  Brief narrative: Lisa Sosa is a 89 y.o. 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 10/17, patient was brought to the ED by EMS from home after an unwitnessed fall.  Per report, family heard her fall in the other room while she was trying to get out of the bed.  Since then, patient was able to stand up and ambulate but noticed right sided back pain which worsened and hence presented to the ED. Skeletal survey done in the ED. CT lumbar spine showed an acute appearing compression fracture of the L1 vertebral body with moderate height loss.  Also showed multilevel lumbar spondylosis, worst at L3-4, where there is severe spinal canal stenosis. At least moderate spinal canal stenosis at L2-3 and L4-5. Admitted to [facility] Neurosurgery was consulted.  Subjective: Patient was seen and examined this morning.  Sitting up in recliner.  Not in distress.  Per PT, patient did not complain of any dizziness while transferring from bed to recliner today.  Family not at bedside.  Assessment and plan: L1 vertebral body compression fracture Secondary to mechanical fall Neurosurgery consult appreciated. Considered to be a noncandidate for surgical intervention given her age and overall conditions.  Started on TLSO brace when out of bed For pain control, start Tylenol scheduled 1 g 3 times daily, continue as needed oxycodone. PT eval obtained, SNF recommended  Orthostatic hypotension Patient has chronic orthostatic hypotension and it might have been the reason of her fall. 10/18, blood pressure dropped from 105/40 to 88/42 just on keeping her legs up versus down  in the chair.  She was dizzy to be transferred from bed to chair. She was started on midodrine 2.5 mg 3 times daily.  Orthostatics improving.   10/21, did not get dizzy today on transfer.  Vitamin D deficiency Vitamin D level significantly low at 17.  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  Goals of care   Code Status: Limited: Do not attempt resuscitation (DNR) -DNR-LIMITED -Do Not Intubate/DNI      DVT prophylaxis:  enoxaparin (LOVENOX) injection 30 mg Start: 07/29/23 2200   Antimicrobials: None Fluid: None Consultants: None currently Family Communication: None at bedside  Status: Observation Level of care:  Med-Surg   Patient is from: home Needs to continue in-hospital care: Pending SNF.    Diet:  Diet Order             Diet regular Room service appropriate? Yes; Fluid consistency: Thin  Diet effective now                   Scheduled Meds:  acetaminophen  1,000 mg Oral TID   brimonidine  1 drop Both Eyes BID   calcium carbonate  1 tablet Oral BID WC   docusate sodium  100 mg Oral BID   dorzolamide-timolol  1 drop Both Eyes BID   enoxaparin (LOVENOX) injection  30 mg Subcutaneous Q24H   latanoprost  1 drop Both Eyes QHS   lidocaine  1 patch Transdermal Q24H   midodrine  2.5 mg Oral TID WC   Vitamin D (Ergocalciferol)  50,000 Units Oral  Q7 days    PRN meds: ondansetron **OR** ondansetron (ZOFRAN) IV, oxyCODONE, senna-docusate   Infusions:    Antimicrobials: Anti-infectives (From admission, onward)    None       Objective: Vitals:   08/01/23 1519 08/01/23 2029  BP: (!) 135/52 (!) 154/68  Pulse: 66 76  Resp: 16   Temp: 97.6 F (36.4 C)   SpO2: 98% 98%   No intake or output data in the 24 hours ending 08/02/23 1206  Filed Weights   07/29/23 1206  Weight: 51.3 kg   Weight change:  Body mass index is 18.25 kg/m.   Physical Exam: General exam: Pleasant, elderly.  Not in pain at  rest Skin: No rashes, lesions or ulcers. HEENT: Atraumatic, normocephalic, no obvious bleeding Lungs: Clear to auscultation bilaterally CVS: Regular rate and rhythm, no murmur GI/Abd soft, nontender, nondistended, bowel sound present CNS: Alert, awake, oriented x 3.  Hard of hearing Psychiatry: Mood appropriate.  Cheerful Extremities: No pedal edema, no calf tenderness  Data Review: I have personally reviewed the laboratory data and studies available.  F/u labs ordered Unresulted Labs (From admission, onward)    None       Total time spent in review of labs and imaging, patient evaluation, formulation of plan, documentation and communication with family: 25 minutes  Signed, Lorin Glass, MD [facility] 08/02/2023

## 2023-08-03 DIAGNOSIS — E559 Vitamin D deficiency, unspecified: Secondary | ICD-10-CM | POA: Diagnosis not present

## 2023-08-03 DIAGNOSIS — Z86718 Personal history of other venous thrombosis and embolism: Secondary | ICD-10-CM | POA: Diagnosis not present

## 2023-08-03 DIAGNOSIS — S32000A Wedge compression fracture of unspecified lumbar vertebra, initial encounter for closed fracture: Secondary | ICD-10-CM | POA: Diagnosis present

## 2023-08-03 DIAGNOSIS — Z8572 Personal history of non-Hodgkin lymphomas: Secondary | ICD-10-CM | POA: Diagnosis not present

## 2023-08-03 DIAGNOSIS — S32010D Wedge compression fracture of first lumbar vertebra, subsequent encounter for fracture with routine healing: Secondary | ICD-10-CM | POA: Diagnosis not present

## 2023-08-03 DIAGNOSIS — I2510 Atherosclerotic heart disease of native coronary artery without angina pectoris: Secondary | ICD-10-CM | POA: Diagnosis not present

## 2023-08-03 DIAGNOSIS — M545 Low back pain, unspecified: Secondary | ICD-10-CM | POA: Diagnosis not present

## 2023-08-03 DIAGNOSIS — R5381 Other malaise: Secondary | ICD-10-CM | POA: Diagnosis not present

## 2023-08-03 DIAGNOSIS — Z86711 Personal history of pulmonary embolism: Secondary | ICD-10-CM | POA: Diagnosis not present

## 2023-08-03 DIAGNOSIS — J449 Chronic obstructive pulmonary disease, unspecified: Secondary | ICD-10-CM | POA: Diagnosis not present

## 2023-08-03 DIAGNOSIS — Z90710 Acquired absence of both cervix and uterus: Secondary | ICD-10-CM | POA: Diagnosis not present

## 2023-08-03 DIAGNOSIS — H9193 Unspecified hearing loss, bilateral: Secondary | ICD-10-CM | POA: Diagnosis not present

## 2023-08-03 DIAGNOSIS — Z885 Allergy status to narcotic agent status: Secondary | ICD-10-CM | POA: Diagnosis not present

## 2023-08-03 DIAGNOSIS — Z681 Body mass index (BMI) 19 or less, adult: Secondary | ICD-10-CM | POA: Diagnosis not present

## 2023-08-03 DIAGNOSIS — Z8509 Personal history of malignant neoplasm of other digestive organs: Secondary | ICD-10-CM | POA: Diagnosis not present

## 2023-08-03 DIAGNOSIS — M48061 Spinal stenosis, lumbar region without neurogenic claudication: Secondary | ICD-10-CM | POA: Diagnosis not present

## 2023-08-03 DIAGNOSIS — R54 Age-related physical debility: Secondary | ICD-10-CM | POA: Diagnosis not present

## 2023-08-03 DIAGNOSIS — I9589 Other hypotension: Secondary | ICD-10-CM | POA: Diagnosis not present

## 2023-08-03 DIAGNOSIS — Z9221 Personal history of antineoplastic chemotherapy: Secondary | ICD-10-CM | POA: Diagnosis not present

## 2023-08-03 DIAGNOSIS — Z888 Allergy status to other drugs, medicaments and biological substances status: Secondary | ICD-10-CM | POA: Diagnosis not present

## 2023-08-03 DIAGNOSIS — M4856XA Collapsed vertebra, not elsewhere classified, lumbar region, initial encounter for fracture: Secondary | ICD-10-CM | POA: Diagnosis not present

## 2023-08-03 DIAGNOSIS — S32010A Wedge compression fracture of first lumbar vertebra, initial encounter for closed fracture: Secondary | ICD-10-CM | POA: Diagnosis not present

## 2023-08-03 DIAGNOSIS — K5900 Constipation, unspecified: Secondary | ICD-10-CM | POA: Diagnosis not present

## 2023-08-03 DIAGNOSIS — R636 Underweight: Secondary | ICD-10-CM | POA: Diagnosis not present

## 2023-08-03 DIAGNOSIS — Z66 Do not resuscitate: Secondary | ICD-10-CM | POA: Diagnosis not present

## 2023-08-03 DIAGNOSIS — I951 Orthostatic hypotension: Secondary | ICD-10-CM | POA: Diagnosis not present

## 2023-08-03 DIAGNOSIS — W19XXXD Unspecified fall, subsequent encounter: Secondary | ICD-10-CM | POA: Diagnosis not present

## 2023-08-03 DIAGNOSIS — Z79899 Other long term (current) drug therapy: Secondary | ICD-10-CM | POA: Diagnosis not present

## 2023-08-03 DIAGNOSIS — E785 Hyperlipidemia, unspecified: Secondary | ICD-10-CM | POA: Diagnosis not present

## 2023-08-03 DIAGNOSIS — W19XXXA Unspecified fall, initial encounter: Secondary | ICD-10-CM | POA: Diagnosis present

## 2023-08-03 DIAGNOSIS — Z7401 Bed confinement status: Secondary | ICD-10-CM | POA: Diagnosis not present

## 2023-08-03 DIAGNOSIS — H409 Unspecified glaucoma: Secondary | ICD-10-CM | POA: Diagnosis not present

## 2023-08-03 DIAGNOSIS — M47816 Spondylosis without myelopathy or radiculopathy, lumbar region: Secondary | ICD-10-CM | POA: Diagnosis not present

## 2023-08-03 DIAGNOSIS — R6889 Other general symptoms and signs: Secondary | ICD-10-CM | POA: Diagnosis not present

## 2023-08-03 DIAGNOSIS — Z8616 Personal history of COVID-19: Secondary | ICD-10-CM | POA: Diagnosis not present

## 2023-08-03 MED ORDER — MIDODRINE HCL 2.5 MG PO TABS: 2.5000 mg | ORAL_TABLET | Freq: Three times a day (TID) | ORAL | 0 refills | Status: AC

## 2023-08-03 MED ORDER — CALCIUM CARBONATE 1250 (500 CA) MG PO TABS: 1.0000 | ORAL_TABLET | Freq: Two times a day (BID) | ORAL | Status: DC

## 2023-08-03 MED ORDER — VITAMIN D (ERGOCALCIFEROL) 1.25 MG (50000 UNIT) PO CAPS: 50000.0000 [IU] | ORAL_CAPSULE | ORAL | 0 refills | Status: DC

## 2023-08-03 MED ORDER — OXYCODONE HCL 5 MG PO TABS: 5.0000 mg | ORAL_TABLET | Freq: Four times a day (QID) | ORAL | 0 refills | Status: AC | PRN

## 2023-08-03 MED ORDER — LIDOCAINE 4 % EX PTCH: 1.0000 | MEDICATED_PATCH | Freq: Every day | CUTANEOUS | Status: AC | PRN

## 2023-08-03 MED ORDER — SENNOSIDES-DOCUSATE SODIUM 8.6-50 MG PO TABS: 1.0000 | ORAL_TABLET | Freq: Every day | ORAL | 0 refills | Status: AC

## 2023-08-03 MED ORDER — ACETAMINOPHEN 500 MG PO TABS: 1000.0000 mg | ORAL_TABLET | Freq: Three times a day (TID) | ORAL | Status: DC

## 2023-08-03 NOTE — TOC Progression Note (Signed)
Transition of Care [facility]) - Progression Note    Patient Details  Name: Lisa Sosa MRN: [id number] Date of Birth: 09/02/1923  Transition of Care [facility]) CM/SW Contact  Marlowe Sax, RN Phone Number: 08/03/2023, 9:58 AM  Clinical Narrative:     Called [employer] to check on Ins status for STR, left a Secure VM Awaiting a call back  Expected Discharge Plan: Home w Home Health Services Barriers to Discharge: No Barriers Identified  Expected Discharge Plan and Services   Discharge Planning Services: CM Consult   Living arrangements for the past 2 months: Single Family Home                 DME Arranged: N/A DME Agency: NA       HH Arranged: PT HH Agency: [facility] Date HH Agency Contacted: 07/30/23 Time HH Agency Contacted: 1200 Representative spoke with at [facility] Agency: Coralee North   Social Determinants of Health (SDOH) Interventions SDOH Screenings   Food Insecurity: No Food Insecurity (01/28/2023)  Housing: Low Risk  (01/28/2023)  Transportation Needs: No Transportation Needs (01/28/2023)  Utilities: Not At Risk (01/28/2023)  Depression (PHQ2-9): Low Risk  (01/28/2023)  Financial Resource Strain: Low Risk  (01/28/2023)  Social Connections: Unknown (01/28/2023)  Stress: No Stress Concern Present (01/28/2023)  Tobacco Use: Low Risk  (07/29/2023)    Readmission Risk Interventions     No data to display

## 2023-08-03 NOTE — Plan of Care (Signed)

## 2023-08-03 NOTE — Progress Notes (Signed)
PROGRESS NOTE  Lisa Sosa  DOB: Mar 11, 1923  PCP: Glori Luis, MD EXB:[id number]  DOA: 07/29/2023  LOS: 0 days  Hospital Day: 6  Brief narrative: Lisa Sosa is a 89 y.o. 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 10/17, patient was brought to the ED by EMS from home after an unwitnessed fall.  Per report, family heard her fall in the other room while she was trying to get out of the bed.  Since then, patient was able to stand up and ambulate but noticed right sided back pain which worsened and hence presented to the ED. Skeletal survey done in the ED. CT lumbar spine showed an acute appearing compression fracture of the L1 vertebral body with moderate height loss.  Also showed multilevel lumbar spondylosis, worst at L3-4, where there is severe spinal canal stenosis. At least moderate spinal canal stenosis at L2-3 and L4-5. Admitted to [facility] Neurosurgery was consulted.  Subjective: Patient was seen and examined this morning.  Sitting up in recliner.  Not in distress.  Daughter at bedside. Hemodynamically stable.  Not feeling dizzy and transfers from bed to chair Pending SNF placement   Assessment and plan: L1 vertebral body compression fracture Secondary to mechanical fall Neurosurgery consult appreciated. Considered to be a noncandidate for surgical intervention given her age and overall conditions.  Started on TLSO brace when out of bed For pain control, patient is currently on Tylenol scheduled 1 g 3 times daily, continue as needed oxycodone. PT eval obtained, SNF recommended  Orthostatic hypotension Patient has chronic orthostatic hypotension and it might have been the reason of her fall. 10/18, blood pressure dropped from 105/40 to 88/42 just on keeping her  legs up versus down in the chair.  She was dizzy to be transferred from bed to chair. She was started on midodrine 2.5 mg 3 times daily.  Orthostatics improving.   10/21, did not get dizzy today on transfer.  Vitamin D deficiency Vitamin D level significantly low at 17.  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  Goals of care   Code Status: Limited: Do not attempt resuscitation (DNR) -DNR-LIMITED -Do Not Intubate/DNI      DVT prophylaxis:  enoxaparin (LOVENOX) injection 30 mg Start: 07/29/23 2200   Antimicrobials: None Fluid: None Consultants: None currently Family Communication: Daughter at bedside  Status: Inpatient Level of care:  Med-Surg   Patient is from: home Needs to continue in-hospital care: Pending SNF.    Diet:  Diet Order             Diet regular Room service appropriate? Yes; Fluid consistency: Thin  Diet effective now                   Scheduled Meds:  acetaminophen  1,000 mg Oral TID   brimonidine  1 drop Both Eyes BID   calcium carbonate  1 tablet Oral BID WC   docusate sodium  100 mg Oral BID   dorzolamide-timolol  1 drop Both Eyes BID   enoxaparin (LOVENOX) injection  30 mg Subcutaneous Q24H   latanoprost  1 drop Both Eyes QHS   lidocaine  1 patch Transdermal Q24H   midodrine  2.5 mg Oral TID WC   Vitamin D (Ergocalciferol)  50,000 Units  Oral Q7 days    PRN meds: ondansetron **OR** ondansetron (ZOFRAN) IV, oxyCODONE, senna-docusate   Infusions:    Antimicrobials: Anti-infectives (From admission, onward)    None       Objective: Vitals:   08/03/23 0827 08/03/23 1339  BP: (!) 153/61 (!) 144/58  Pulse: 63 67  Resp: 16 14  Temp: 97.6 F (36.4 C) 98.3 F (36.8 C)  SpO2: 99% 99%    Intake/Output Summary (Last 24 hours) at 08/03/2023 1537 Last data filed at 08/03/2023 1422 Gross per 24 hour  Intake 120 ml  Output --  Net 120 ml    Filed Weights   07/29/23 1206   Weight: 51.3 kg   Weight change:  Body mass index is 18.25 kg/m.   Physical Exam: General exam: Pleasant, elderly.  Not in pain at rest Skin: No rashes, lesions or ulcers. HEENT: Atraumatic, normocephalic, no obvious bleeding Lungs: Clear to auscultation bilaterally CVS: Regular rate and rhythm, no murmur GI/Abd soft, nontender, nondistended, bowel sound present CNS: Alert, awake, oriented x 3.  Hard of hearing Psychiatry: Mood appropriate.  Cheerful Extremities: No pedal edema, no calf tenderness  Data Review: I have personally reviewed the laboratory data and studies available.  F/u labs ordered Unresulted Labs (From admission, onward)     Start     Ordered   08/04/23 0500  CBC  Tomorrow morning,   R       Question:  Specimen collection method  Answer:  Lab=Lab collect   08/03/23 0957   08/04/23 0500  Basic metabolic panel  Tomorrow morning,   R       Question:  Specimen collection method  Answer:  Lab=Lab collect   08/03/23 1537            Total time spent in review of labs and imaging, patient evaluation, formulation of plan, documentation and communication with family: 25 minutes  Signed, Lorin Glass, MD [facility] 08/03/2023

## 2023-08-03 NOTE — Progress Notes (Signed)
Occupational Therapy Treatment Patient Details Name: Lisa Sosa MRN: [id number] DOB: 11-Oct-1923 Today's Date: 08/03/2023   History of present illness 89 yo female that had a fall at home, noted for L1 compression fx, TLSO. 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   OT comments  Pt received semi-reclined in bed. Appearing alert; willing to work with OT on grooming. T/f MOD A for bed mobility. See flowsheet below for further details of session. Left semi-reclined in bed, alarm on, with all needs in reach.  Patient will benefit from continued OT while in acute care.      If plan is discharge home, recommend the following:  Assistance with cooking/housework;Assist for transportation;Direct supervision/assist for financial management;Supervision due to cognitive status;Direct supervision/assist for medications management;Help with stairs or ramp for entrance;A lot of help with bathing/dressing/bathroom;A lot of help with walking and/or transfers   Equipment Recommendations  Other (comment) (defer to next venue of care)    Recommendations for Other Services      Precautions / Restrictions Precautions Precautions: Fall Required Braces or Orthoses: Spinal Brace Spinal Brace: Thoracolumbosacral orthotic Restrictions Weight Bearing Restrictions: No       Mobility Bed Mobility Overal bed mobility: Needs Assistance Bed Mobility: Supine to Sit, Sit to Supine     Supine to sit: Mod assist, HOB elevated Sit to supine: Mod assist   General bed mobility comments: Cues for log roll and physical assist required.    Transfers Overall transfer level: Needs assistance Equipment used: Rolling walker (2 wheels) Transfers: Sit to/from Stand Sit to Stand: Mod assist           General transfer comment: Pt took a few sidesteps to the R with RW and cues for when to stop and  reach for rail to t/f to sitting.     Balance Overall balance assessment: Needs assistance Sitting-balance support: Feet supported Sitting balance-Leahy Scale: Fair Sitting balance - Comments: Maintains EOB sitting without support for approx 6 minutes, then noted to have some posterior lean.   Standing balance support: Bilateral upper extremity supported, Reliant on assistive device for balance Standing balance-Leahy Scale: Fair Standing balance comment: RW                           ADL either performed or assessed with clinical judgement   ADL Overall ADL's : Needs assistance/impaired     Grooming: Set up;Supervision/safety;Cueing for sequencing;Sitting;Wash/dry face;Oral care;Brushing hair Grooming Details (indicate cue type and reason): Required intermittent cues for next step of the task (such as brushing teeth) even when tools were right in front of her.             Lower Body Dressing: Maximal assistance Lower Body Dressing Details (indicate cue type and reason): OT assisted to don mesh underwear with pad at bed level at beginning of session in case of urinary incontinence when up (however, pt did not experience incontinence during session)             Functional mobility during ADLs: Minimal assistance;Rolling walker (2 wheels) General ADL Comments: Pt participating in seated ADLs today; TLSO on while seated and standing.    Extremity/Trunk Assessment Upper Extremity Assessment Upper Extremity Assessment: Generalized weakness   Lower Extremity Assessment Lower Extremity Assessment: Generalized weakness        Vision       Perception  Praxis      Cognition Arousal: Alert Behavior During Therapy: WFL for tasks assessed/performed Overall Cognitive Status: History of cognitive impairments - at baseline                                 General Comments: Follows simple commands with visual and verbal cues, intermittent tactile  cues        Exercises      Shoulder Instructions       General Comments Very HOH, but follows verbal, gesture, and tactile cues well.    Pertinent Vitals/ Pain       Pain Assessment Pain Assessment: 0-10 Pain Score:  ("a little") Pain Location: back pain Pain Descriptors / Indicators: Moaning, Guarding, Grimacing Pain Intervention(s): Limited activity within patient's tolerance, Monitored during session, Repositioned  Home Living                                          Prior Functioning/Environment              Frequency  Min 1X/week        Progress Toward Goals  OT Goals(current goals can now be found in the care plan section)  Progress towards OT goals: Progressing toward goals  Acute Rehab OT Goals Patient Stated Goal: Get better OT Goal Formulation: With patient Time For Goal Achievement: 08/14/23 Potential to Achieve Goals: Good ADL Goals Pt Will Perform Grooming: with supervision;sitting;standing Pt Will Perform Lower Body Dressing: with supervision;sit to/from stand Pt Will Transfer to Toilet: with supervision;ambulating Pt Will Perform Toileting - Clothing Manipulation and hygiene: with supervision;sitting/lateral leans  Plan      Co-evaluation                 AM-PAC OT "6 Clicks" Daily Activity     Outcome Measure   Help from another person eating meals?: A Little Help from another person taking care of personal grooming?: A Little Help from another person toileting, which includes using toliet, bedpan, or urinal?: A Lot Help from another person bathing (including washing, rinsing, drying)?: A Lot Help from another person to put on and taking off regular upper body clothing?: A Lot Help from another person to put on and taking off regular lower body clothing?: A Lot 6 Click Score: 14    End of Session Equipment Utilized During Treatment: Rolling walker (2 wheels);Other (comment) (TLSO)  OT Visit Diagnosis: Other  abnormalities of gait and mobility (R26.89);Muscle weakness (generalized) (M62.81);History of falling (Z91.81)   Activity Tolerance Patient tolerated treatment well   Patient Left in bed;with call bell/phone within reach;with bed alarm set   Nurse Communication Mobility status; OT removed sacral mepilex during standing because it was not sticking properly.        Time: 4098-1191 OT Time Calculation (min): 27 min  Charges: OT General Charges $OT Visit: 1 Visit OT Treatments $Self Care/Home Management : 23-37 mins  Linward Foster, MS, OTR/L  Alvester Morin 08/03/2023, 3:04 PM

## 2023-08-03 NOTE — Progress Notes (Signed)
Physical Therapy Treatment Patient Details Name: Lisa Sosa MRN: [id number] DOB: [birth year] Today's Date: 08/03/2023   History of Present Illness 89 yo female that had a fall at home, noted for L1 compression fx, TLSO. 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    PT Comments  Pt pleasant and motivated to work with PT, does not endorse much pain.  PT adjusted TLSO for improved comfort and fit, pt tolerates changes well.  She still struggles to stand upright (per baseline) but did show progressively deteriorating posture t/o standing/gait and needed consistent cues to rectify.  Pt with mild c/o fatigue that did increase with increased distance, but SpO2 and HR remained stable and appropriate t/o the effort.  Pt making gains, will benefit from further PT, continue with POC.    If plan is discharge home, recommend the following: A little help with bathing/dressing/bathroom;Assistance with cooking/housework;Supervision due to cognitive status;Assist for transportation;Assistance with feeding;A lot of help with walking and/or transfers;Help with stairs or ramp for entrance;Direct supervision/assist for medications management   Can travel by private vehicle     No  Equipment Recommendations  Other (comment) (TBD)    Recommendations for Other Services       Precautions / Restrictions Precautions Precautions: Fall Required Braces or Orthoses: Spinal Brace Spinal Brace: Thoracolumbosacral orthotic Restrictions Weight Bearing Restrictions: No     Mobility  Bed Mobility Overal bed mobility: Needs Assistance Bed Mobility: Supine to Sit     Supine to sit: Mod assist, HOB elevated, Used rails     General bed mobility comments: not observed as patient sitting up on arrival and post session    Transfers Overall transfer level: Needs assistance Equipment used: Rolling  walker (2 wheels) Transfers: Sit to/from Stand Sit to Stand: Min assist, Contact guard assist           General transfer comment: TLSO donned while sitting in chair - PT made appropriate adjustments for comfort and biomechanical appropriatness, prior to standing. lifting assistance required for standing. verbal cues for hand placement/safety    Ambulation/Gait Ambulation/Gait assistance: Min assist Gait Distance (Feet): 90 Feet Assistive device: Rolling walker (2 wheels)         General Gait Details: Pt motivated to do a prolonged walk and ultimately was able to ambulate nearly 100 ft.  Pt needed consistent cuing for staying up right and staying inside the walker.  She did not have any LOBs or excessive fatigue (SpO2 and HR remained stable and appropriate t/o).   Stairs             Wheelchair Mobility     Tilt Bed    Modified Rankin (Stroke Patients Only)       Balance Overall balance assessment: Needs assistance Sitting-balance support: Feet supported Sitting balance-Leahy Scale: Fair Sitting balance - Comments: Pt able to maintain EOB sitting w/o back support with good relative confidence   Standing balance support: Bilateral upper extremity supported, Reliant on assistive device for balance Standing balance-Leahy Scale: Fair Standing balance comment: Once assisted to upright pt was able to maintain static standing w/ appropriate AD use, got leaning too far forward with prolonged standing acts needing consistent cuing to correct                            Cognition Arousal: Alert Behavior During Therapy: [facility] for tasks assessed/performed  Overall Cognitive Status: History of cognitive impairments - at baseline Area of Impairment: Following commands, Attention, Problem solving                   Current Attention Level: Sustained   Following Commands: Follows one step commands with increased time                Exercises       General Comments General comments (skin integrity, edema, etc.): Pt's HOH makes some aspects of the treatment difficult, but ultimately she showed good effort      Pertinent Vitals/Pain Pain Assessment Pain Assessment: No/denies pain    Home Living                          Prior Function            PT Goals (current goals can now be found in the care plan section) Progress towards PT goals: Progressing toward goals    Frequency    Min 1X/week      PT Plan      Co-evaluation              AM-PAC PT "6 Clicks" Mobility   Outcome Measure  Help needed turning from your back to your side while in a flat bed without using bedrails?: A Little Help needed moving from lying on your back to sitting on the side of a flat bed without using bedrails?: A Little Help needed moving to and from a bed to a chair (including a wheelchair)?: A Little Help needed standing up from a chair using your arms (e.g., wheelchair or bedside chair)?: A Little Help needed to walk in hospital room?: A Lot Help needed climbing 3-5 steps with a railing? : Total 6 Click Score: 15    End of Session Equipment Utilized During Treatment: Back brace Activity Tolerance: Patient tolerated treatment well Patient left: with call bell/phone within reach;with chair alarm set;with family/visitor present Nurse Communication: Mobility status PT Visit Diagnosis: Other abnormalities of gait and mobility (R26.89);Muscle weakness (generalized) (M62.81);Pain Pain - part of body:  (lumbago)     Time: 1610-9604 PT Time Calculation (min) (ACUTE ONLY): 26 min  Charges:    $Gait Training: 8-22 mins $Therapeutic Activity: 8-22 mins PT General Charges $$ ACUTE PT VISIT: 1 Visit                     Malachi Pro, DPT 08/03/2023, 1:27 PM

## 2023-08-04 DIAGNOSIS — S32010A Wedge compression fracture of first lumbar vertebra, initial encounter for closed fracture: Secondary | ICD-10-CM | POA: Diagnosis not present

## 2023-08-04 DIAGNOSIS — I9589 Other hypotension: Secondary | ICD-10-CM | POA: Diagnosis not present

## 2023-08-04 DIAGNOSIS — W19XXXD Unspecified fall, subsequent encounter: Secondary | ICD-10-CM

## 2023-08-04 LAB — BASIC METABOLIC PANEL
Anion gap: 7 (ref 5–15)
BUN: 37 mg/dL — ABNORMAL HIGH (ref 8–23)
CO2: 25 mmol/L (ref 22–32)
Calcium: 9 mg/dL (ref 8.9–10.3)
Chloride: 108 mmol/L (ref 98–111)
Creatinine, Ser: 1.13 mg/dL — ABNORMAL HIGH (ref 0.44–1.00)
GFR, Estimated: 44 mL/min — ABNORMAL LOW (ref >60)
Glucose, Bld: 93 mg/dL (ref 70–99)
Potassium: 3.9 mmol/L (ref 3.5–5.1)
Sodium: 140 mmol/L (ref 135–145)

## 2023-08-04 LAB — CBC
HCT: 33.5 % — ABNORMAL LOW (ref 36.0–46.0)
Hemoglobin: 11.1 g/dL — ABNORMAL LOW (ref 12.0–15.0)
MCH: 30.2 pg (ref 26.0–34.0)
MCHC: 33.1 g/dL (ref 30.0–36.0)
MCV: 91.3 fL (ref 80.0–100.0)
Platelets: 190 10*3/uL (ref 150–400)
RBC: 3.67 MIL/uL — ABNORMAL LOW (ref 3.87–5.11)
RDW: 13.7 % (ref 11.5–15.5)
WBC: 4.4 10*3/uL (ref 4.0–10.5)
nRBC: 0 % (ref 0.0–0.2)

## 2023-08-04 MED ORDER — OXYCODONE HCL 5 MG PO TABS
5.0000 mg | ORAL_TABLET | Freq: Four times a day (QID) | ORAL | Status: DC | PRN
  Filled 2023-08-04: qty 1

## 2023-08-04 NOTE — Progress Notes (Addendum)
Occupational Therapy Treatment Patient Details Name: Lisa Sosa MRN: [id number] DOB: [birth year] Today's Date: 08/04/2023   History of present illness Pt. is a 89 yo female that had a fall at home, noted for L1 compression fx, TLSO. 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   OT comments  Upon arrival, and throughout the session this morning Pt. was  tearful, and perseverating on being worried about her daughter. Pt. was reported that she had not seen her daughter in the past 2 days, and reports being concerned that something has happened to her. This was clarified with the nurse, who reported that her daughter was here yesterday, and is planning to come today as well. This was relayed to the Pt. Pt. required constant reassurance, and redirection. Pt. Performed light 1 step self-grooming tasks seated with increased cues, and time to initiate. Pt. continues to benefit from OT services for ADL training, A/E training, and pt./caregiver education about cognitive compensatory strategies, home modification, and DME.       If plan is discharge home, recommend the following:  Assistance with cooking/housework;Assist for transportation;Direct supervision/assist for financial management;Supervision due to cognitive status;Direct supervision/assist for medications management;Help with stairs or ramp for entrance;A lot of help with bathing/dressing/bathroom;A lot of help with walking and/or transfers   Equipment Recommendations       Recommendations for Other Services      Precautions / Restrictions Precautions Precautions: Fall Required Braces or Orthoses: Spinal Brace Spinal Brace: Thoracolumbosacral orthotic Restrictions Weight Bearing Restrictions: No (Simultaneous filing. User may not have seen previous data.) Other Position/Activity Restrictions: per neurosurgery note: "TLSO  brace which she can use for comfort.  This does not need to be on while she is in bed or resting"       Mobility Bed Mobility    General bed mobility comments: Pt. up in recliner chair upon arrival    Transfers   Deferred                       Balance                                           ADL either performed or assessed with clinical judgement   ADL       Grooming: Set up;Supervision/safety;Cueing for sequencing;Sitting;Wash/dry face Grooming Details (indicate cue type and reason): Step by step cues, and cues for redirection           Upper Body Dressing Details (indicate cue type and reason): TLSO Lower Body Dressing: Maximal assistance                      Extremity/Trunk Assessment Upper Extremity Assessment Upper Extremity Assessment: Generalized weakness            Vision Baseline Vision/History: 1 Wears glasses     Perception     Praxis      Cognition Arousal: Alert Behavior During Therapy: Flat affect (Emotional/tearful perseverating on being worried about her daughter, and not seeing her for a couple of days.) Overall Cognitive Status: History of cognitive impairments - at baseline Area of Impairment: Following commands, Attention, Problem solving                   Current Attention Level: Sustained  Following Commands: Follows one step commands with increased time     Problem Solving: Decreased initiation, Slow processing General Comments: Follows simple commands with visual and verbal cues, intermittent tactile cues        Exercises      Shoulder Instructions       General Comments      Pertinent Vitals/ Pain       Pain Assessment Pain Assessment: No/denies pain  Home Living                                          Prior Functioning/Environment              Frequency  Min 1X/week        Progress Toward Goals  OT Goals(current goals can now be  found in the care plan section)  Progress towards OT goals: Progressing toward goals  Acute Rehab OT Goals Patient Stated Goal: To get better OT Goal Formulation: With patient Time For Goal Achievement: 08/14/23 Potential to Achieve Goals: Good  Plan      Co-evaluation                 AM-PAC OT "6 Clicks" Daily Activity     Outcome Measure   Help from another person eating meals?: A Little Help from another person taking care of personal grooming?: A Little Help from another person toileting, which includes using toliet, bedpan, or urinal?: A Lot Help from another person bathing (including washing, rinsing, drying)?: A Lot Help from another person to put on and taking off regular upper body clothing?: A Lot Help from another person to put on and taking off regular lower body clothing?: A Lot 6 Click Score: 14    End of Session    OT Visit Diagnosis: Other abnormalities of gait and mobility (R26.89);Muscle weakness (generalized) (M62.81);History of falling (Z91.81)   Activity Tolerance Other (comment) (perseverating on being worried about her daughter)   Patient Left     Nurse Communication          Time: [phone] OT Time Calculation (min): 24 min  Charges: OT General Charges $OT Visit: 1 Visit OT Treatments $Self Care/Home Management : 23-37 mins  Olegario Messier, MS, OTR/L  Olegario Messier 08/04/2023, 10:00 AM

## 2023-08-04 NOTE — TOC Progression Note (Addendum)
Transition of Care [facility]) - Progression Note    Patient Details  Name: AMAREA FOX MRN: [id number] Date of Birth: 1923-07-14  Transition of Care [facility]) CM/SW Contact  Hetty Ely, RN Phone Number: 08/04/2023, 1:35 PM  Clinical Narrative: CM called [employer] to inquire about Ins. Auth, left message. CM spoke with Daughter about SNF update, informed her that Ins. Auth was pending and she will be notified when update is received.   3:00 pm Return call from [facility] that a Peer to Peer was requested, MD to call by 3 pm [phone]. Notified Attending via secure chat who attempted to call, given number [phone], which he called and left message. 3:40 PM Per Attending P2P done and approved, patient may discharge tomorrow, notified [employer], afternoon admission approved. Attempted to call POA, no answer mailbox full.    Expected Discharge Plan: Home w Home Health Services Barriers to Discharge: No Barriers Identified  Expected Discharge Plan and Services   Discharge Planning Services: CM Consult   Living arrangements for the past 2 months: Single Family Home                 DME Arranged: N/A DME Agency: NA       HH Arranged: PT HH Agency: [facility] Date HH Agency Contacted: 07/30/23 Time HH Agency Contacted: 1200 Representative spoke with at [facility] Agency: Coralee North   Social Determinants of Health (SDOH) Interventions SDOH Screenings   Food Insecurity: No Food Insecurity (01/28/2023)  Housing: Low Risk  (01/28/2023)  Transportation Needs: No Transportation Needs (01/28/2023)  Utilities: Not At Risk (01/28/2023)  Depression (PHQ2-9): Low Risk  (01/28/2023)  Financial Resource Strain: Low Risk  (01/28/2023)  Social Connections: Unknown (01/28/2023)  Stress: No Stress Concern Present (01/28/2023)  Tobacco Use: Low Risk  (07/29/2023)    Readmission Risk Interventions     No data to display

## 2023-08-04 NOTE — Progress Notes (Signed)
PROGRESS NOTE  Lisa Sosa  DOB: 12-18-1922  PCP: Glori Luis, MD UXN:[id number]  DOA: 07/29/2023  LOS: 1 day  Hospital Day: 7  Brief narrative: Lisa Sosa is a 89 y.o. 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 10/17, patient was brought to the ED by EMS from home after an unwitnessed fall.  Per report, family heard her fall in the other room while she was trying to get out of the bed.  Since then, patient was able to stand up and ambulate but noticed right sided back pain which worsened and hence presented to the ED. Skeletal survey done in the ED. CT lumbar spine showed an acute appearing compression fracture of the L1 vertebral body with moderate height loss.  Also showed multilevel lumbar spondylosis, worst at L3-4, where there is severe spinal canal stenosis. At least moderate spinal canal stenosis at L2-3 and L4-5. Admitted to [facility] Neurosurgery recommended conservative management and outpatient follow-up  10/23: Waiting for insurance Auth and possible SNF placement  Subjective: No new issues.  Daughter at bedside.  Appreciative of her care Pending SNF placement and insurance Auth   Assessment and plan: L1 vertebral body compression fracture Secondary to mechanical fall Neurosurgery consult appreciated. Considered to be a noncandidate for surgical intervention given her age and overall conditions.  Started on TLSO brace when out of bed For pain control, patient is currently on Tylenol scheduled 1 g 3 times daily, continue as needed oxycodone. PT eval obtained, SNF recommended.  Waiting for insurance Auth and possible SNF placement  Orthostatic hypotension Patient has chronic orthostatic hypotension and it might have been the reason of her fall. 10/18,  blood pressure dropped from 105/40 to 88/42 just on keeping her legs up versus down in the chair.  She was dizzy to be transferred from bed to chair. Continue midodrine 2.5 mg 3 times daily.  Orthostatics improving.   10/21, did not get dizzy today on transfer.  Vitamin D deficiency Vitamin D level significantly low at 17.  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  Goals of care   Code Status: Limited: Do not attempt resuscitation (DNR) -DNR-LIMITED -Do Not Intubate/DNI      DVT prophylaxis:  enoxaparin (LOVENOX) injection 30 mg Start: 07/29/23 2200   Antimicrobials: None Fluid: None Consultants: None currently Family Communication: Daughter at bedside  Status: Inpatient Level of care:  Med-Surg   Patient is from: home Needs to continue in-hospital care: Pending insurance Auth for SNF placement.  Patient is medically stable for discharge    Diet:  Diet Order             Diet regular Room service appropriate? Yes; Fluid consistency: Thin  Diet effective now                   Scheduled Meds:  acetaminophen  1,000 mg Oral TID   brimonidine  1 drop Both Eyes BID   calcium carbonate  1 tablet Oral BID WC   docusate sodium  100 mg Oral BID   dorzolamide-timolol  1 drop Both Eyes BID   enoxaparin (LOVENOX) injection  30 mg Subcutaneous Q24H   latanoprost  1 drop Both Eyes QHS   lidocaine  1 patch Transdermal Q24H   midodrine  2.5 mg Oral TID WC   Vitamin D (Ergocalciferol)  50,000 Units Oral Q7 days    PRN meds: ondansetron **OR** ondansetron (ZOFRAN) IV, oxyCODONE, senna-docusate   Infusions:    Antimicrobials: Anti-infectives (From admission, onward)    None       Objective: Vitals:   08/03/23 2324 08/04/23 0803  BP: (!) 104/45 (!) 145/49  Pulse: 63 66  Resp: 18 17  Temp: 97.6 F (36.4 C)   SpO2: 97% 99%    Intake/Output Summary (Last 24 hours) at 08/04/2023 1459 Last data filed at  08/04/2023 1300 Gross per 24 hour  Intake 480 ml  Output --  Net 480 ml    Filed Weights   07/29/23 1206  Weight: 51.3 kg   Weight change:  Body mass index is 18.25 kg/m.   Physical Exam: General exam: Pleasant, elderly.  Not in pain at rest Skin: No rashes, lesions or ulcers. HEENT: Atraumatic, normocephalic, no obvious bleeding Lungs: Clear to auscultation bilaterally CVS: Regular rate and rhythm, no murmur GI/Abd soft, nontender, nondistended, bowel sound present CNS: Alert, awake, oriented x 3.  Hard of hearing Psychiatry: Mood appropriate.  Cheerful Extremities: No pedal edema, no calf tenderness  Data Review: I have personally reviewed the laboratory data and studies available.  F/u labs ordered Unresulted Labs (From admission, onward)    None       Total time spent in review of labs and imaging, patient evaluation, formulation of plan, documentation and communication with family: 25 minutes  Signed, Delfino Lovett, MD [facility] 08/04/2023

## 2023-08-04 NOTE — Progress Notes (Signed)
Physical Therapy Treatment Patient Details Name: Lisa Sosa MRN: [id number] DOB: 1923-01-31 Today's Date: 08/04/2023   History of Present Illness Pt. is a 89 yo female that had a fall at home, noted for L1 compression fx, TLSO. 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    PT Comments  Pt pleasantly confused but willing and eager to participate with PT.  Pt's daughter present and was helpful with cuing and guidance as she really did need excessive cues and reminders for walker use/positioning and generally insuring that she was safe, on task and appropriately performing mobility.  She did not tolerate nearly as much ambulation this date, clearly with some fatigue and inability to maintain terminal knee extension with increased stance time.  Pt's vitals stable and she does not endorse increased pain but she continues to have weakness and postural issues that limit her safety and mobility.  Pt making slow gains, will benefit from further PT to address functional limitations, continue with POC.     If plan is discharge home, recommend the following: A little help with bathing/dressing/bathroom;Assistance with cooking/housework;Supervision due to cognitive status;Assist for transportation;Assistance with feeding;A lot of help with walking and/or transfers;Help with stairs or ramp for entrance;Direct supervision/assist for medications management   Can travel by private vehicle     No  Equipment Recommendations  Other (comment) (TBD at rehab)    Recommendations for Other Services       Precautions / Restrictions Precautions Precautions: Fall Required Braces or Orthoses: Spinal Brace Spinal Brace: Thoracolumbosacral orthotic Restrictions Weight Bearing Restrictions: No Other Position/Activity Restrictions: per neurosurgery note: "TLSO brace which she can use for comfort.  This  does not need to be on while she is in bed or resting"     Mobility  Bed Mobility Overal bed mobility: Needs Assistance Bed Mobility: Supine to Sit, Sit to Supine       Sit to supine: Min assist, Mod assist   General bed mobility comments: in recliner on arrival, unable to get LEs back into bed post session, min/mod and heavy verbal cues to get back to supine. Pt able to bridge and scoot in bed with much cuing    Transfers Overall transfer level: Needs assistance Equipment used: Rolling walker (2 wheels) Transfers: Sit to/from Stand Sit to Stand: Mod assist           General transfer comment: Pt was pleasantly confused, and needed constant cuing for even simple tasks but showed good effort when able    Ambulation/Gait Ambulation/Gait assistance: Min assist, Mod assist Gait Distance (Feet): 30 Feet Assistive device: Rolling walker (2 wheels)         General Gait Details: Pt with more forward leaning, poor RW positioning/awareness issue this date.  She was able to briefly maintain and more "upright" posture with initial cuing but would quickly revert to hunched, lacking TKE, UE on walker reliant gait that required consistently more cuing today than on yesterday's ambulation effort.  Pt endorses fatigue with the effort, SpO2 low 90s HR 80s after ambulation   Stairs             Wheelchair Mobility     Tilt Bed    Modified Rankin (Stroke Patients Only)       Balance Overall balance assessment: Needs assistance Sitting-balance support: Feet supported Sitting balance-Leahy Scale: Fair     Standing balance support: Bilateral upper extremity supported,  Reliant on assistive device for balance Standing balance-Leahy Scale: Poor Standing balance comment: reliant on RW, poor postural awareness/positioning and generally leaning too heavily on walker necessitating constant direct assist (tactile/RW/physical assist) and a lot cuing                             Cognition Arousal: Alert Behavior During Therapy: Flat affect Overall Cognitive Status: History of cognitive impairments - at baseline Area of Impairment: Following commands, Safety/judgement, Awareness, Attention, Problem solving                       Following Commands: Follows one step commands with increased time     Problem Solving: Decreased initiation, Slow processing General Comments: Follows simple commands with visual, verbal cues and tactile cues        Exercises      General Comments        Pertinent Vitals/Pain Pain Assessment Pain Assessment: No/denies pain    Home Living                          Prior Function            PT Goals (current goals can now be found in the care plan section) Progress towards PT goals: Progressing toward goals    Frequency    Min 1X/week      PT Plan      Co-evaluation              AM-PAC PT "6 Clicks" Mobility   Outcome Measure  Help needed turning from your back to your side while in a flat bed without using bedrails?: A Little Help needed moving from lying on your back to sitting on the side of a flat bed without using bedrails?: A Little Help needed moving to and from a bed to a chair (including a wheelchair)?: A Lot Help needed standing up from a chair using your arms (e.g., wheelchair or bedside chair)?: A Lot Help needed to walk in hospital room?: A Lot Help needed climbing 3-5 steps with a railing? : Total 6 Click Score: 13    End of Session Equipment Utilized During Treatment: Back brace Activity Tolerance: Patient tolerated treatment well Patient left: with call bell/phone within reach;with chair alarm set;with family/visitor present Nurse Communication: Mobility status PT Visit Diagnosis: Other abnormalities of gait and mobility (R26.89);Muscle weakness (generalized) (M62.81);Pain Pain - part of body:  (lumbago)     Time: 6578-4696 PT Time Calculation (min) (ACUTE  ONLY): 25 min  Charges:    $Gait Training: 8-22 mins $Therapeutic Activity: 8-22 mins PT General Charges $$ ACUTE PT VISIT: 1 Visit                     Malachi Pro, DPT 08/04/2023, 5:05 PM

## 2023-08-04 NOTE — Plan of Care (Signed)

## 2023-08-04 NOTE — Plan of Care (Signed)
  Problem: Clinical Measurements: Goal: Will remain free from infection Outcome: Progressing   Problem: Nutrition: Goal: Adequate nutrition will be maintained Outcome: Progressing   Problem: Pain Managment: Goal: General experience of comfort will improve Outcome: Progressing   Problem: Skin Integrity: Goal: Risk for impaired skin integrity will decrease Outcome: Progressing   

## 2023-08-05 DIAGNOSIS — K5900 Constipation, unspecified: Secondary | ICD-10-CM | POA: Diagnosis not present

## 2023-08-05 DIAGNOSIS — M51369 Other intervertebral disc degeneration, lumbar region without mention of lumbar back pain or lower extremity pain: Secondary | ICD-10-CM | POA: Diagnosis not present

## 2023-08-05 DIAGNOSIS — M545 Low back pain, unspecified: Secondary | ICD-10-CM

## 2023-08-05 DIAGNOSIS — S32010S Wedge compression fracture of first lumbar vertebra, sequela: Secondary | ICD-10-CM | POA: Diagnosis not present

## 2023-08-05 DIAGNOSIS — M47816 Spondylosis without myelopathy or radiculopathy, lumbar region: Secondary | ICD-10-CM | POA: Diagnosis not present

## 2023-08-05 DIAGNOSIS — R5381 Other malaise: Secondary | ICD-10-CM | POA: Diagnosis not present

## 2023-08-05 DIAGNOSIS — S32010D Wedge compression fracture of first lumbar vertebra, subsequent encounter for fracture with routine healing: Secondary | ICD-10-CM | POA: Diagnosis not present

## 2023-08-05 DIAGNOSIS — H409 Unspecified glaucoma: Secondary | ICD-10-CM | POA: Insufficient documentation

## 2023-08-05 DIAGNOSIS — C8510 Unspecified B-cell lymphoma, unspecified site: Secondary | ICD-10-CM | POA: Diagnosis not present

## 2023-08-05 DIAGNOSIS — I9589 Other hypotension: Secondary | ICD-10-CM | POA: Diagnosis not present

## 2023-08-05 DIAGNOSIS — E559 Vitamin D deficiency, unspecified: Secondary | ICD-10-CM | POA: Diagnosis not present

## 2023-08-05 DIAGNOSIS — I82509 Chronic embolism and thrombosis of unspecified deep veins of unspecified lower extremity: Secondary | ICD-10-CM | POA: Diagnosis not present

## 2023-08-05 DIAGNOSIS — I951 Orthostatic hypotension: Secondary | ICD-10-CM | POA: Diagnosis not present

## 2023-08-05 DIAGNOSIS — W19XXXD Unspecified fall, subsequent encounter: Secondary | ICD-10-CM | POA: Diagnosis not present

## 2023-08-05 DIAGNOSIS — S32010A Wedge compression fracture of first lumbar vertebra, initial encounter for closed fracture: Secondary | ICD-10-CM | POA: Diagnosis not present

## 2023-08-05 DIAGNOSIS — Z7401 Bed confinement status: Secondary | ICD-10-CM | POA: Diagnosis not present

## 2023-08-05 DIAGNOSIS — M4856XA Collapsed vertebra, not elsewhere classified, lumbar region, initial encounter for fracture: Secondary | ICD-10-CM | POA: Diagnosis not present

## 2023-08-05 DIAGNOSIS — M8580 Other specified disorders of bone density and structure, unspecified site: Secondary | ICD-10-CM | POA: Diagnosis not present

## 2023-08-05 DIAGNOSIS — R6889 Other general symptoms and signs: Secondary | ICD-10-CM | POA: Diagnosis not present

## 2023-08-05 NOTE — Progress Notes (Signed)
Attempted to call report, was told receiving RN is busy, left number for call back and will attempt again later.

## 2023-08-05 NOTE — Discharge Summary (Signed)
Physician Discharge Summary   Patient: Lisa Sosa MRN: [id number] DOB: 1923-03-23  Admit date:     07/29/2023  Discharge date: 08/05/23  Discharge Physician: Delfino Lovett   PCP: Glori Luis, MD   Recommendations at discharge:    F/up with outpt providers as requested  Discharge Diagnoses: Principal Problem:   Fracture of lumbar vertebra, compression [facility]) Active Problems:   Debility   Closed compression fracture of body of L1 vertebra (HCC)   Fall   Acute low back pain without sciatica  Hospital Course:  Lisa Sosa is a 89 y.o. 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 On 10/17, patient was brought to the ED by EMS from home after an unwitnessed fall.  Per report, family heard her fall in the other room while she was trying to get out of the bed.  Since then, patient was able to stand up and ambulate but noticed right sided back pain which worsened and hence presented to the ED. Skeletal survey done in the ED. CT lumbar spine showed an acute appearing compression fracture of the L1 vertebral body with moderate height loss.  Also showed multilevel lumbar spondylosis, worst at L3-4, where there is severe spinal canal stenosis. At least moderate spinal canal stenosis at L2-3 and L4-5.  Neurosurgery recommended conservative management and outpatient follow-up   10/23: Waiting for insurance Auth and possible SNF placement 10/24: D/C to [facility]    Assessment and plan: L1 vertebral body compression fracture Secondary to mechanical fall Neurosurgery - recommends conservative mgmt for now. not a noncandidate for surgical intervention given her age and overall conditions.  Started on TLSO brace when out of bed For pain control, patient is currently on Tylenol  scheduled 1 g 3 times daily, continue as needed oxycodone. PT recommends SNF - going to [facility]   Orthostatic hypotension Patient has chronic orthostatic hypotension and it might have been the reason of her fall. 10/18, blood pressure dropped from 105/40 to 88/42 just on keeping her legs up versus down in the chair.  She was dizzy to be transferred from bed to chair. Continue midodrine 2.5 mg 3 times daily.  Orthostatics improving.   She is not/less dizzy on transfer for last few days   Vitamin D deficiency Vitamin D level significantly low at 17.  Continue Vit D supplement   Glucoma -Stable, continue current home regimen of eyedrops.       Consultants: Neurosurgery Disposition: Skilled nursing facility Diet recommendation:  Cardiac diet DISCHARGE MEDICATION: Allergies as of 08/05/2023       Reactions   Iron Nausea And Vomiting   Codeine Nausea Only        Medication List     STOP taking these medications    Se-Tan PLUS 162-115.2-1 MG Caps       TAKE these medications    acetaminophen 500 MG tablet Commonly known as: TYLENOL Take 2 tablets (1,000 mg total) by mouth 3 (three) times daily.   brimonidine 0.2 % ophthalmic solution Commonly known as: ALPHAGAN Place 1 drop into both eyes 2 (two) times daily.   calcium carbonate 1250 (500 Ca) MG tablet Commonly known as: OS-CAL - dosed in mg of elemental calcium Take 1 tablet (1,250 mg total) by mouth 2 (two) times daily with a meal.  dorzolamide-timolol 2-0.5 % ophthalmic solution Commonly known as: COSOPT 1 drop 2 (two) times daily.   latanoprost 0.005 % ophthalmic solution Commonly known as: XALATAN Place 1 drop into both eyes at bedtime.   lidocaine 4 % Place 1 patch onto the skin daily as needed (pain).   midodrine 2.5 MG tablet Commonly known as: PROAMATINE Take 1 tablet (2.5 mg total) by mouth 3 (three) times daily with meals.   oxyCODONE 5 MG immediate release tablet Commonly known  as: Oxy IR/ROXICODONE Take 1 tablet (5 mg total) by mouth every 6 (six) hours as needed for up to 5 days for moderate pain (pain score 4-6).   senna-docusate 8.6-50 MG tablet Commonly known as: Senokot-S Take 1 tablet by mouth at bedtime.   Vitamin D (Ergocalciferol) 1.25 MG (50000 UNIT) Caps capsule Commonly known as: DRISDOL Take 1 capsule (50,000 Units total) by mouth every 7 (seven) days for 7 doses.        Follow-up Information     Glori Luis, MD Follow up.   Specialty: Family Medicine Contact information: [address] [place] [address] [place] [address] [phone]                Discharge Exam: Ceasar Mons Weights   07/29/23 1206  Weight: 51.3 kg   General exam: Pleasant, elderly.  Sitting in the chair Skin: No rashes, lesions or ulcers. HEENT: Atraumatic, normocephalic, no obvious bleeding Lungs: Clear to auscultation bilaterally CVS: Regular rate and rhythm, no murmur GI/Abd soft, benign CNS: Alert, awake, oriented x 3.  Hard of hearing Psychiatry: Mood appropriate.  Cheerful Extremities: No pedal edema, no calf tenderness  Condition at discharge: fair  The results of significant diagnostics from this hospitalization (including imaging, microbiology, ancillary and laboratory) are listed below for reference.   Imaging Studies: CT Cervical Spine Wo Contrast  Result Date: 07/29/2023 CLINICAL DATA:  Neck trauma (Age >= 65y); Ataxia, thoracic trauma; Back trauma, no prior imaging (Age >= 16y). EXAM: CT CERVICAL, THORACIC, AND LUMBAR SPINE WITHOUT CONTRAST TECHNIQUE: Multidetector CT imaging of the cervical, thoracic and lumbar spine was performed without intravenous contrast. Multiplanar CT image reconstructions were also generated. RADIATION DOSE REDUCTION: This exam was performed according to the departmental dose-optimization program which includes automated exposure control, adjustment of the mA and/or kV according to patient size and/or use of  iterative reconstruction technique. COMPARISON:  None Available. FINDINGS: CT CERVICAL SPINE FINDINGS Alignment: No traumatic malalignment. Skull base and vertebrae: No acute fracture. Normal craniocervical junction. No suspicious bone lesions. Soft tissues and spinal canal: No prevertebral fluid or swelling. No visible canal hematoma. Disc levels: Mild cervical spondylosis without high-grade spinal canal stenosis. Upper chest: No acute findings. CT THORACIC SPINE FINDINGS Alignment: Dextroscoliotic curvature of the upper thoracic spine. No traumatic malalignment. Vertebrae: No acute fracture or focal pathologic process. Paraspinal and other soft tissues: No acute findings. Disc levels: No high-grade spinal canal stenosis. CT LUMBAR SPINE FINDINGS Segmentation: 5 lumbar type vertebrae. Alignment: Normal. Vertebrae: Acute appearing compression fracture of the L1 vertebral body with moderate height loss. No extension into the posterior elements or significant retropulsion. Paraspinal and other soft tissues: Unremarkable. Disc levels: Multilevel lumbar spondylosis, worst at L3-4, where there is severe spinal canal stenosis. At least moderate spinal canal stenosis at L2-3 and L4-5. IMPRESSION: 1. Acute appearing compression fracture of the L1 vertebral body with moderate height loss. No extension into the posterior elements or significant retropulsion. 2. No acute fracture or traumatic malalignment of the cervical or thoracic  spine. 3. Multilevel lumbar spondylosis, worst at L3-4, where there is severe spinal canal stenosis. At least moderate spinal canal stenosis at L2-3 and L4-5. Electronically Signed   By: Orvan Falconer M.D.   On: 07/29/2023 14:19   CT Thoracic Spine Wo Contrast  Result Date: 07/29/2023 CLINICAL DATA:  Neck trauma (Age >= 65y); Ataxia, thoracic trauma; Back trauma, no prior imaging (Age >= 16y). EXAM: CT CERVICAL, THORACIC, AND LUMBAR SPINE WITHOUT CONTRAST TECHNIQUE: Multidetector CT imaging  of the cervical, thoracic and lumbar spine was performed without intravenous contrast. Multiplanar CT image reconstructions were also generated. RADIATION DOSE REDUCTION: This exam was performed according to the departmental dose-optimization program which includes automated exposure control, adjustment of the mA and/or kV according to patient size and/or use of iterative reconstruction technique. COMPARISON:  None Available. FINDINGS: CT CERVICAL SPINE FINDINGS Alignment: No traumatic malalignment. Skull base and vertebrae: No acute fracture. Normal craniocervical junction. No suspicious bone lesions. Soft tissues and spinal canal: No prevertebral fluid or swelling. No visible canal hematoma. Disc levels: Mild cervical spondylosis without high-grade spinal canal stenosis. Upper chest: No acute findings. CT THORACIC SPINE FINDINGS Alignment: Dextroscoliotic curvature of the upper thoracic spine. No traumatic malalignment. Vertebrae: No acute fracture or focal pathologic process. Paraspinal and other soft tissues: No acute findings. Disc levels: No high-grade spinal canal stenosis. CT LUMBAR SPINE FINDINGS Segmentation: 5 lumbar type vertebrae. Alignment: Normal. Vertebrae: Acute appearing compression fracture of the L1 vertebral body with moderate height loss. No extension into the posterior elements or significant retropulsion. Paraspinal and other soft tissues: Unremarkable. Disc levels: Multilevel lumbar spondylosis, worst at L3-4, where there is severe spinal canal stenosis. At least moderate spinal canal stenosis at L2-3 and L4-5. IMPRESSION: 1. Acute appearing compression fracture of the L1 vertebral body with moderate height loss. No extension into the posterior elements or significant retropulsion. 2. No acute fracture or traumatic malalignment of the cervical or thoracic spine. 3. Multilevel lumbar spondylosis, worst at L3-4, where there is severe spinal canal stenosis. At least moderate spinal canal  stenosis at L2-3 and L4-5. Electronically Signed   By: Orvan Falconer M.D.   On: 07/29/2023 14:19   CT Lumbar Spine Wo Contrast  Result Date: 07/29/2023 CLINICAL DATA:  Neck trauma (Age >= 65y); Ataxia, thoracic trauma; Back trauma, no prior imaging (Age >= 16y). EXAM: CT CERVICAL, THORACIC, AND LUMBAR SPINE WITHOUT CONTRAST TECHNIQUE: Multidetector CT imaging of the cervical, thoracic and lumbar spine was performed without intravenous contrast. Multiplanar CT image reconstructions were also generated. RADIATION DOSE REDUCTION: This exam was performed according to the departmental dose-optimization program which includes automated exposure control, adjustment of the mA and/or kV according to patient size and/or use of iterative reconstruction technique. COMPARISON:  None Available. FINDINGS: CT CERVICAL SPINE FINDINGS Alignment: No traumatic malalignment. Skull base and vertebrae: No acute fracture. Normal craniocervical junction. No suspicious bone lesions. Soft tissues and spinal canal: No prevertebral fluid or swelling. No visible canal hematoma. Disc levels: Mild cervical spondylosis without high-grade spinal canal stenosis. Upper chest: No acute findings. CT THORACIC SPINE FINDINGS Alignment: Dextroscoliotic curvature of the upper thoracic spine. No traumatic malalignment. Vertebrae: No acute fracture or focal pathologic process. Paraspinal and other soft tissues: No acute findings. Disc levels: No high-grade spinal canal stenosis. CT LUMBAR SPINE FINDINGS Segmentation: 5 lumbar type vertebrae. Alignment: Normal. Vertebrae: Acute appearing compression fracture of the L1 vertebral body with moderate height loss. No extension into the posterior elements or significant retropulsion. Paraspinal and other soft tissues:  Unremarkable. Disc levels: Multilevel lumbar spondylosis, worst at L3-4, where there is severe spinal canal stenosis. At least moderate spinal canal stenosis at L2-3 and L4-5. IMPRESSION: 1.  Acute appearing compression fracture of the L1 vertebral body with moderate height loss. No extension into the posterior elements or significant retropulsion. 2. No acute fracture or traumatic malalignment of the cervical or thoracic spine. 3. Multilevel lumbar spondylosis, worst at L3-4, where there is severe spinal canal stenosis. At least moderate spinal canal stenosis at L2-3 and L4-5. Electronically Signed   By: Orvan Falconer M.D.   On: 07/29/2023 14:19    Microbiology: Results for orders placed or performed during the hospital encounter of 10/08/21  Resp Panel by RT-PCR (Flu A&B, Covid) Nasopharyngeal Swab     Status: Abnormal   Collection Time: 10/07/21  7:52 PM   Specimen: Nasopharyngeal Swab; Nasopharyngeal(NP) swabs in vial transport medium  Result Value Ref Range Status   SARS Coronavirus 2 by RT PCR POSITIVE (A) NEGATIVE Final    Comment: (NOTE) SARS-CoV-2 target nucleic acids are DETECTED.  The SARS-CoV-2 RNA is generally detectable in upper respiratory specimens during the acute phase of infection. Positive results are indicative of the presence of the identified virus, but do not rule out bacterial infection or co-infection with other pathogens not detected by the test. Clinical correlation with patient history and other diagnostic information is necessary to determine patient infection status. The expected result is Negative.  Fact Sheet for Patients: BloggerCourse.com  Fact Sheet for Healthcare Providers: SeriousBroker.it  This test is not yet approved or cleared by the [place] FDA and  has been authorized for detection and/or diagnosis of SARS-CoV-2 by FDA under an Emergency Use Authorization (EUA).  This EUA will remain in effect (meaning this test can be used) for the duration of  the COVID-19 declaration under Section 564(b)(1) of the A ct, 21 U.S.C. section 360bbb-3(b)(1), unless the authorization  is terminated or revoked sooner.     Influenza A by PCR NEGATIVE NEGATIVE Final   Influenza B by PCR NEGATIVE NEGATIVE Final    Comment: (NOTE) The Xpert Xpress SARS-CoV-2/FLU/RSV plus assay is intended as an aid in the diagnosis of influenza from Nasopharyngeal swab specimens and should not be used as a sole basis for treatment. Nasal washings and aspirates are unacceptable for Xpert Xpress SARS-CoV-2/FLU/RSV testing.  Fact Sheet for Patients: BloggerCourse.com  Fact Sheet for Healthcare Providers: SeriousBroker.it  This test is not yet approved or cleared by the [place] FDA and has been authorized for detection and/or diagnosis of SARS-CoV-2 by FDA under an Emergency Use Authorization (EUA). This EUA will remain in effect (meaning this test can be used) for the duration of the COVID-19 declaration under Section 564(b)(1) of the Act, 21 U.S.C. section 360bbb-3(b)(1), unless the authorization is terminated or revoked.  Performed at [facility], [address] [place], [place] [address]     Labs: CBC: Recent Labs  Lab 07/29/23 1529 08/04/23 0553  WBC 5.9 4.4  NEUTROABS 4.3  --   HGB 11.1* 11.1*  HCT 33.9* 33.5*  MCV 92.1 91.3  PLT 147* 190   Basic Metabolic Panel: Recent Labs  Lab 07/29/23 1529 08/04/23 0553  NA 139 140  K 4.2 3.9  CL 108 108  CO2 22 25  GLUCOSE 110* 93  BUN 28* 37*  CREATININE 1.01* 1.13*  CALCIUM 8.8* 9.0   Liver Function Tests: No results for input(s): "AST", "ALT", "ALKPHOS", "BILITOT", "PROT", "ALBUMIN" in the last 168  hours. CBG: Recent Labs  Lab 07/31/23 2035  GLUCAP 101*    Discharge time spent: greater than 30 minutes.  Signed: Delfino Lovett, MD [facility] 08/05/2023

## 2023-08-05 NOTE — Plan of Care (Signed)
  Problem: Education: Goal: Knowledge of General Education information will improve Description: Including pain rating scale, medication(s)/side effects and non-pharmacologic comfort measures Outcome: Progressing   Problem: Clinical Measurements: Goal: Will remain free from infection Outcome: Progressing   Problem: Activity: Goal: Risk for activity intolerance will decrease Outcome: Progressing   Problem: Health Behavior/Discharge Planning: Goal: Ability to manage health-related needs will improve Outcome: Not Progressing

## 2023-08-05 NOTE — TOC Transition Note (Signed)
Transition of Care [facility]) - CM/SW Discharge Note   Patient Details  Name: Lisa Sosa MRN: [id number] Date of Birth: 1923/08/30  Transition of Care [facility]) CM/SW Contact:  Hetty Ely, RN Phone Number: 08/05/2023, 10:24 AM   Clinical Narrative:   Patient to discharge to [facility] room 310, nurse to call report to [phone]. Daughter/POA notified. EMS arranged for noon pick up.    Final next level of care: Skilled Nursing Facility Barriers to Discharge: Barriers Resolved   Patient Goals and CMS Choice      Discharge Placement                    Name of family member notified: [name], [place] Patient and family notified of of transfer: 08/05/23  Discharge Plan and Services Additional resources added to the After Visit Summary for     Discharge Planning Services: CM Consult            DME Arranged: N/A DME Agency: NA       HH Arranged: NA HH Agency: NA Date HH Agency Contacted: 07/30/23 Time HH Agency Contacted: 1200 Representative spoke with at [facility] Agency: Coralee North  Social Determinants of Health (SDOH) Interventions SDOH Screenings   Food Insecurity: No Food Insecurity (01/28/2023)  Housing: Low Risk  (01/28/2023)  Transportation Needs: No Transportation Needs (01/28/2023)  Utilities: Not At Risk (01/28/2023)  Depression (PHQ2-9): Low Risk  (01/28/2023)  Financial Resource Strain: Low Risk  (01/28/2023)  Social Connections: Unknown (01/28/2023)  Stress: No Stress Concern Present (01/28/2023)  Tobacco Use: Low Risk  (07/29/2023)     Readmission Risk Interventions     No data to display

## 2023-08-06 DIAGNOSIS — I951 Orthostatic hypotension: Secondary | ICD-10-CM | POA: Diagnosis not present

## 2023-08-06 DIAGNOSIS — H409 Unspecified glaucoma: Secondary | ICD-10-CM | POA: Diagnosis not present

## 2023-08-06 DIAGNOSIS — K5900 Constipation, unspecified: Secondary | ICD-10-CM | POA: Diagnosis not present

## 2023-08-06 DIAGNOSIS — E559 Vitamin D deficiency, unspecified: Secondary | ICD-10-CM | POA: Diagnosis not present

## 2023-08-06 DIAGNOSIS — S32010D Wedge compression fracture of first lumbar vertebra, subsequent encounter for fracture with routine healing: Secondary | ICD-10-CM | POA: Diagnosis not present

## 2023-08-09 ENCOUNTER — Inpatient Hospital Stay: Payer: PPO | Admitting: Family Medicine

## 2023-08-09 DIAGNOSIS — I951 Orthostatic hypotension: Secondary | ICD-10-CM | POA: Diagnosis not present

## 2023-08-09 DIAGNOSIS — E559 Vitamin D deficiency, unspecified: Secondary | ICD-10-CM | POA: Diagnosis not present

## 2023-08-09 DIAGNOSIS — K5900 Constipation, unspecified: Secondary | ICD-10-CM | POA: Diagnosis not present

## 2023-08-09 DIAGNOSIS — S32010D Wedge compression fracture of first lumbar vertebra, subsequent encounter for fracture with routine healing: Secondary | ICD-10-CM | POA: Diagnosis not present

## 2023-08-09 DIAGNOSIS — H409 Unspecified glaucoma: Secondary | ICD-10-CM | POA: Diagnosis not present

## 2023-08-10 DIAGNOSIS — C8510 Unspecified B-cell lymphoma, unspecified site: Secondary | ICD-10-CM | POA: Diagnosis not present

## 2023-08-10 DIAGNOSIS — E559 Vitamin D deficiency, unspecified: Secondary | ICD-10-CM | POA: Diagnosis not present

## 2023-08-10 DIAGNOSIS — I951 Orthostatic hypotension: Secondary | ICD-10-CM | POA: Diagnosis not present

## 2023-08-10 DIAGNOSIS — H409 Unspecified glaucoma: Secondary | ICD-10-CM | POA: Diagnosis not present

## 2023-08-10 DIAGNOSIS — I82509 Chronic embolism and thrombosis of unspecified deep veins of unspecified lower extremity: Secondary | ICD-10-CM | POA: Diagnosis not present

## 2023-08-10 DIAGNOSIS — S32010D Wedge compression fracture of first lumbar vertebra, subsequent encounter for fracture with routine healing: Secondary | ICD-10-CM | POA: Diagnosis not present

## 2023-08-10 DIAGNOSIS — K5900 Constipation, unspecified: Secondary | ICD-10-CM | POA: Diagnosis not present

## 2023-08-11 DIAGNOSIS — I951 Orthostatic hypotension: Secondary | ICD-10-CM | POA: Diagnosis not present

## 2023-08-11 DIAGNOSIS — S32010D Wedge compression fracture of first lumbar vertebra, subsequent encounter for fracture with routine healing: Secondary | ICD-10-CM | POA: Diagnosis not present

## 2023-08-11 DIAGNOSIS — H409 Unspecified glaucoma: Secondary | ICD-10-CM | POA: Diagnosis not present

## 2023-08-11 DIAGNOSIS — K5900 Constipation, unspecified: Secondary | ICD-10-CM | POA: Diagnosis not present

## 2023-08-13 DIAGNOSIS — S32010D Wedge compression fracture of first lumbar vertebra, subsequent encounter for fracture with routine healing: Secondary | ICD-10-CM | POA: Diagnosis not present

## 2023-08-13 DIAGNOSIS — H409 Unspecified glaucoma: Secondary | ICD-10-CM | POA: Diagnosis not present

## 2023-08-13 DIAGNOSIS — E559 Vitamin D deficiency, unspecified: Secondary | ICD-10-CM | POA: Diagnosis not present

## 2023-08-13 DIAGNOSIS — I951 Orthostatic hypotension: Secondary | ICD-10-CM | POA: Diagnosis not present

## 2023-08-13 DIAGNOSIS — K5900 Constipation, unspecified: Secondary | ICD-10-CM | POA: Diagnosis not present

## 2023-08-16 DIAGNOSIS — I951 Orthostatic hypotension: Secondary | ICD-10-CM | POA: Diagnosis not present

## 2023-08-16 DIAGNOSIS — S32010D Wedge compression fracture of first lumbar vertebra, subsequent encounter for fracture with routine healing: Secondary | ICD-10-CM | POA: Diagnosis not present

## 2023-08-16 DIAGNOSIS — K5900 Constipation, unspecified: Secondary | ICD-10-CM | POA: Diagnosis not present

## 2023-08-16 DIAGNOSIS — E559 Vitamin D deficiency, unspecified: Secondary | ICD-10-CM | POA: Diagnosis not present

## 2023-08-16 DIAGNOSIS — H409 Unspecified glaucoma: Secondary | ICD-10-CM | POA: Diagnosis not present

## 2023-08-18 DIAGNOSIS — K5900 Constipation, unspecified: Secondary | ICD-10-CM | POA: Diagnosis not present

## 2023-08-18 DIAGNOSIS — H409 Unspecified glaucoma: Secondary | ICD-10-CM | POA: Diagnosis not present

## 2023-08-18 DIAGNOSIS — I951 Orthostatic hypotension: Secondary | ICD-10-CM | POA: Diagnosis not present

## 2023-08-18 DIAGNOSIS — S32010D Wedge compression fracture of first lumbar vertebra, subsequent encounter for fracture with routine healing: Secondary | ICD-10-CM | POA: Diagnosis not present

## 2023-08-20 ENCOUNTER — Other Ambulatory Visit: Payer: Self-pay

## 2023-08-20 DIAGNOSIS — S32010S Wedge compression fracture of first lumbar vertebra, sequela: Secondary | ICD-10-CM

## 2023-08-23 ENCOUNTER — Encounter: Payer: Self-pay | Admitting: Neurosurgery

## 2023-08-23 ENCOUNTER — Ambulatory Visit
Admission: RE | Admit: 2023-08-23 | Discharge: 2023-08-23 | Disposition: A | Discharge status: Home / Self Care | Payer: PPO | Source: Ambulatory Visit | Attending: Neurosurgery | Admitting: Neurosurgery

## 2023-08-23 ENCOUNTER — Ambulatory Visit: Payer: PPO | Admitting: Neurosurgery

## 2023-08-23 VITALS — BP 136/78 | Ht 66.0 in | Wt 113.0 lb

## 2023-08-23 DIAGNOSIS — S32010A Wedge compression fracture of first lumbar vertebra, initial encounter for closed fracture: Secondary | ICD-10-CM | POA: Insufficient documentation

## 2023-08-23 DIAGNOSIS — W19XXXD Unspecified fall, subsequent encounter: Secondary | ICD-10-CM

## 2023-08-23 DIAGNOSIS — M47816 Spondylosis without myelopathy or radiculopathy, lumbar region: Secondary | ICD-10-CM | POA: Diagnosis not present

## 2023-08-23 DIAGNOSIS — S32010D Wedge compression fracture of first lumbar vertebra, subsequent encounter for fracture with routine healing: Secondary | ICD-10-CM | POA: Diagnosis not present

## 2023-08-23 DIAGNOSIS — M8580 Other specified disorders of bone density and structure, unspecified site: Secondary | ICD-10-CM | POA: Diagnosis not present

## 2023-08-23 DIAGNOSIS — S32010S Wedge compression fracture of first lumbar vertebra, sequela: Secondary | ICD-10-CM

## 2023-08-23 DIAGNOSIS — M51369 Other intervertebral disc degeneration, lumbar region without mention of lumbar back pain or lower extremity pain: Secondary | ICD-10-CM | POA: Diagnosis not present

## 2023-08-23 DIAGNOSIS — M4856XA Collapsed vertebra, not elsewhere classified, lumbar region, initial encounter for fracture: Secondary | ICD-10-CM | POA: Diagnosis not present

## 2023-08-23 NOTE — Progress Notes (Signed)
   HISTORY OF PRESENT ILLNESS: 08/23/2023 Ms. Lisa Sosa is following up today.  I saw her in the hospital on 07/30/2023 for a lumbar compression fracture.  She had pain limited weakness in her hips at that time and had significant pain in her back.  We planned for follow-up in clinic to see whether or not she was getting some improvement with conservative care and time management.  PHYSICAL EXAMINATION:   Vitals:   08/23/23 1458  BP: 136/78   General: Patient is well developed, well nourished, calm, collected, and in no apparent distress.  NEUROLOGICAL:  General: In no acute distress.  Awake, alert, oriented to person, place, and time. Pupils equal round and reactive to light.   Strength: She is at least 4-4+ in the bilateral lower extremities.  Where she was originally having some restriction in hip flexion secondary to pain this is improved significantly.  ROS (Neurologic): Negative except as noted above  IMAGING: X-rays have been reviewed, no evidence of interval progression of her compression fracture.  No worsening kyphosis.  Overall appears stable.  ASSESSMENT/PLAN:  Lisa Sosa is continuing to improve after a lumbar compression fracture.  This is L1 body fracture and she was having some pain with hip flexion bilaterally.  However she has had a significant improvement.  Continues to ambulate with a walker or for a long distances will use a chair.  Working with physical therapy currently in a nursing facility.  Working towards discharge home.  At this point her back pain is improving significantly, she can continue to progress as tolerated.  No active restrictions.  No need for surgical intervention.  She can follow-up as needed.  At this point would not recommend kyphoplasty.  Lovenia Kim, MD/MS Department of Neurosurgery

## 2023-08-27 DIAGNOSIS — E559 Vitamin D deficiency, unspecified: Secondary | ICD-10-CM | POA: Diagnosis not present

## 2023-08-27 DIAGNOSIS — K5900 Constipation, unspecified: Secondary | ICD-10-CM | POA: Diagnosis not present

## 2023-08-27 DIAGNOSIS — I951 Orthostatic hypotension: Secondary | ICD-10-CM | POA: Diagnosis not present

## 2023-08-27 DIAGNOSIS — H409 Unspecified glaucoma: Secondary | ICD-10-CM | POA: Diagnosis not present

## 2023-08-27 DIAGNOSIS — S32010D Wedge compression fracture of first lumbar vertebra, subsequent encounter for fracture with routine healing: Secondary | ICD-10-CM | POA: Diagnosis not present

## 2023-08-30 DIAGNOSIS — S32010D Wedge compression fracture of first lumbar vertebra, subsequent encounter for fracture with routine healing: Secondary | ICD-10-CM | POA: Diagnosis not present

## 2023-08-30 DIAGNOSIS — H409 Unspecified glaucoma: Secondary | ICD-10-CM | POA: Diagnosis not present

## 2023-08-30 DIAGNOSIS — K5900 Constipation, unspecified: Secondary | ICD-10-CM | POA: Diagnosis not present

## 2023-08-30 DIAGNOSIS — I951 Orthostatic hypotension: Secondary | ICD-10-CM | POA: Diagnosis not present

## 2023-08-30 DIAGNOSIS — E559 Vitamin D deficiency, unspecified: Secondary | ICD-10-CM | POA: Diagnosis not present

## 2023-09-01 ENCOUNTER — Telehealth: Payer: Self-pay

## 2023-09-01 DIAGNOSIS — H409 Unspecified glaucoma: Secondary | ICD-10-CM | POA: Diagnosis not present

## 2023-09-01 DIAGNOSIS — M5451 Vertebrogenic low back pain: Secondary | ICD-10-CM | POA: Diagnosis not present

## 2023-09-01 DIAGNOSIS — S32010D Wedge compression fracture of first lumbar vertebra, subsequent encounter for fracture with routine healing: Secondary | ICD-10-CM | POA: Diagnosis not present

## 2023-09-01 DIAGNOSIS — Z8572 Personal history of non-Hodgkin lymphomas: Secondary | ICD-10-CM | POA: Diagnosis not present

## 2023-09-01 DIAGNOSIS — R2689 Other abnormalities of gait and mobility: Secondary | ICD-10-CM | POA: Diagnosis not present

## 2023-09-01 DIAGNOSIS — Z9221 Personal history of antineoplastic chemotherapy: Secondary | ICD-10-CM | POA: Diagnosis not present

## 2023-09-01 DIAGNOSIS — W19XXXD Unspecified fall, subsequent encounter: Secondary | ICD-10-CM | POA: Diagnosis not present

## 2023-09-01 NOTE — Telephone Encounter (Signed)
Lisa Sosa from [place] called wanting some clarity on the brace restrictions. Per Dr Katrinka Blazing, Lisa Sosa was notified that the patient does not have to wear the brace.

## 2023-09-01 NOTE — Transitions of Care (Post Inpatient/ED Visit) (Signed)
09/01/2023  Name: Lisa Sosa MRN: [id number] DOB: 03-31-1923  Today's TOC FU Call Status: Today's TOC FU Call Status:: Successful TOC FU Call Completed TOC FU Call Complete Date: 09/01/23 Patient's Name and Date of Birth confirmed.  Transition Care Management Follow-up Telephone Call Date of Discharge: 08/31/23 Discharge Facility: Other [facility]) Name of Other ([facility]) Discharge Facility: [facility] Type of Discharge: Inpatient Admission Primary Inpatient Discharge Diagnosis:: fracture lumbar vertebra How have you been since you were released from the hospital?: Better Any questions or concerns?: No  Items Reviewed: Did you receive and understand the discharge instructions provided?: Yes Medications obtained,verified, and reconciled?: Yes (Medications Reviewed) Any new allergies since your discharge?: No Dietary orders reviewed?: Yes Do you have support at home?: Yes People in Home: child(ren), adult  Medications Reviewed Today: Medications Reviewed Today     Reviewed by Karena Addison, LPN (Licensed Practical Nurse) on 09/01/23 at 1119  Med List Status: <None>   Medication Order Taking? Sig Documenting Provider Last Dose Status Informant  acetaminophen (TYLENOL) 500 MG tablet [id number] No Take 2 tablets (1,000 mg total) by mouth 3 (three) times daily. Lorin Glass, MD Taking Active   brimonidine (ALPHAGAN) 0.2 % ophthalmic solution [id number] No Place 1 drop into both eyes 2 (two) times daily. [provider] Taking Active Child           Med Note Sharia Reeve   Wed Oct 08, 2021 10:23 AM)    calcium carbonate (OS-CAL - DOSED IN MG OF ELEMENTAL CALCIUM) 1250 (500 Ca) MG tablet [id number] No Take 1 tablet (1,250 mg total) by mouth 2 (two) times daily with a meal. Dahal, Melina Schools, MD Taking Active   dorzolamide-timolol (COSOPT) 22.3-6.8 MG/ML ophthalmic solution [id number] No 1 drop 2 (two) times daily. [provider] Taking Active Child   latanoprost (XALATAN) 0.005 % ophthalmic solution [id number] No Place 1 drop into both eyes at bedtime. [provider] Taking Active Child           Med Note Sharia Reeve   Wed Oct 08, 2021 10:23 AM)    lidocaine 4 % [id number] No Place 1 patch onto the skin daily as needed (pain). Lorin Glass, MD Taking Active   midodrine (PROAMATINE) 2.5 MG tablet [id number] No Take 1 tablet (2.5 mg total) by mouth 3 (three) times daily with meals. Lorin Glass, MD Taking Active   senna-docusate (SENOKOT-S) 8.6-50 MG tablet [id number] No Take 1 tablet by mouth at bedtime. Lorin Glass, MD Taking Active   Vitamin D, Ergocalciferol, (DRISDOL) 1.25 MG (50000 UNIT) CAPS capsule [id number] No Take 1 capsule (50,000 Units total) by mouth every 7 (seven) days for 7 doses. Lorin Glass, MD Taking Active   Med List Note Judeth Horn, CPhT 07/29/23 1532): Crumpton,Naomi (POA)  [phone]            Home Care and Equipment/Supplies: Were Home Health Services Ordered?: Yes Name of Home Health Agency:: [facility] Has Agency set up a time to come to your home?: Yes First Home Health Visit Date: 09/01/23 Any new equipment or medical supplies ordered?: NA  Functional Questionnaire: Do you need assistance with bathing/showering or dressing?: Yes Do you need assistance with meal preparation?: Yes Do you need assistance with eating?: No Do you have difficulty maintaining continence: No Do you need assistance with getting out of bed/getting out of a chair/moving?: No Do you have difficulty managing or taking your medications?: Yes  Follow up appointments reviewed: PCP Follow-up appointment confirmed?:  Yes Date of PCP follow-up appointment?: 09/15/23 Follow-up Provider: [facility] Follow-up appointment confirmed?: NA Do you need transportation to your follow-up appointment?: No Do you understand care options if your condition(s) worsen?: Yes-patient verbalized  understanding    SIGNATURE Karena Addison, LPN [facility] Nurse Health Advisor Direct Dial [phone]

## 2023-09-01 NOTE — Telephone Encounter (Signed)
Reviewed

## 2023-09-14 DIAGNOSIS — S32010D Wedge compression fracture of first lumbar vertebra, subsequent encounter for fracture with routine healing: Secondary | ICD-10-CM | POA: Diagnosis not present

## 2023-09-14 DIAGNOSIS — W19XXXD Unspecified fall, subsequent encounter: Secondary | ICD-10-CM | POA: Diagnosis not present

## 2023-09-14 DIAGNOSIS — Z9221 Personal history of antineoplastic chemotherapy: Secondary | ICD-10-CM | POA: Diagnosis not present

## 2023-09-14 DIAGNOSIS — Z8572 Personal history of non-Hodgkin lymphomas: Secondary | ICD-10-CM | POA: Diagnosis not present

## 2023-09-14 DIAGNOSIS — H409 Unspecified glaucoma: Secondary | ICD-10-CM | POA: Diagnosis not present

## 2023-09-14 DIAGNOSIS — R2689 Other abnormalities of gait and mobility: Secondary | ICD-10-CM | POA: Diagnosis not present

## 2023-09-15 ENCOUNTER — Ambulatory Visit: Payer: PPO | Admitting: Family Medicine

## 2023-09-15 ENCOUNTER — Encounter: Payer: Self-pay | Admitting: Family Medicine

## 2023-09-15 VITALS — BP 114/72 | HR 64 | Temp 98.0°F | Ht 66.0 in | Wt 108.0 lb

## 2023-09-15 DIAGNOSIS — I951 Orthostatic hypotension: Secondary | ICD-10-CM | POA: Diagnosis not present

## 2023-09-15 DIAGNOSIS — S32010A Wedge compression fracture of first lumbar vertebra, initial encounter for closed fracture: Secondary | ICD-10-CM

## 2023-09-15 DIAGNOSIS — F320 Major depressive disorder, single episode, mild: Secondary | ICD-10-CM

## 2023-09-15 MED ORDER — MIDODRINE HCL 2.5 MG PO TABS
2.5000 mg | ORAL_TABLET | Freq: Two times a day (BID) | ORAL | 1 refills | Status: DC
Start: 2023-09-15 — End: 2024-01-16

## 2023-09-15 MED ORDER — MIDODRINE HCL 2.5 MG PO TABS
2.5000 mg | ORAL_TABLET | Freq: Three times a day (TID) | ORAL | 1 refills | Status: DC
Start: 2023-09-15 — End: 2023-09-15

## 2023-09-15 NOTE — Assessment & Plan Note (Signed)
Patient without pain at this time.  She will monitor for pain.  Discussed falls precautions.  She will continue PT and OT.

## 2023-09-15 NOTE — Assessment & Plan Note (Addendum)
Chronic issue.  Discussed referral to cardiology for help managing midodrine though given patient's age they preferred not to do that.  For now she will continue midodrine 2.5 mg 3 times daily.  Advised to not lay down for 3 to 4 hours after taking the midodrine.

## 2023-09-15 NOTE — Progress Notes (Signed)
Marikay Alar, MD Phone: [phone]  Lisa Sosa is a 89 y.o. female who presents today for follow-up.  Fall: Patient had a fall when she got up to go to the bathroom.  This fall was unwitnessed.  She was seen in the emergency room and found to have a L1 compression fracture.  Neurosurgery recommended conservative management and outpatient follow-up.  She went to skilled nursing facility and has since been discharged from there and is at home.  She is no longer wearing the brace for this.  She is doing physical therapy and Occupational Therapy.  Patient denies any pain at this time.  Orthostatic hypotension: Patient was noted to have consistent orthostasis in the hospital.  She has been on midodrine 2.5 mg 3 times daily since her hospitalization and her blood pressure has improved.  Depression/memory issues: Patient's daughter reports some depression and short-term and long-term memory issues.  Patient is hesitant to take medication.  Social History   Tobacco Use  Smoking Status Never  Smokeless Tobacco Never    Current Outpatient Medications on File Prior to Visit  Medication Sig Dispense Refill   acetaminophen (TYLENOL) 500 MG tablet Take 2 tablets (1,000 mg total) by mouth 3 (three) times daily.     brimonidine (ALPHAGAN) 0.2 % ophthalmic solution Place 1 drop into both eyes 2 (two) times daily.     calcium carbonate (OS-CAL - DOSED IN MG OF ELEMENTAL CALCIUM) 1250 (500 Ca) MG tablet Take 1 tablet (1,250 mg total) by mouth 2 (two) times daily with a meal.     dorzolamide-timolol (COSOPT) 22.3-6.8 MG/ML ophthalmic solution 1 drop 2 (two) times daily.     latanoprost (XALATAN) 0.005 % ophthalmic solution Place 1 drop into both eyes at bedtime.     No current facility-administered medications on file prior to visit.     ROS see history of present illness  Objective  Physical Exam Vitals:   09/15/23 1507  BP: 114/72  Pulse: 64  Temp: 98 F (36.7 C)  SpO2: 97%    BP  Readings from Last 3 Encounters:  09/15/23 114/72  08/23/23 136/78  08/05/23 (!) 150/60   Wt Readings from Last 3 Encounters:  09/15/23 108 lb (49 kg)  08/23/23 113 lb (51.3 kg)  07/29/23 113 lb 1.5 oz (51.3 kg)    Physical Exam Constitutional:      General: She is not in acute distress.    Appearance: She is not diaphoretic.  Cardiovascular:     Rate and Rhythm: Normal rate and regular rhythm.     Heart sounds: Normal heart sounds.  Pulmonary:     Effort: Pulmonary effort is normal.     Breath sounds: Normal breath sounds.  Musculoskeletal:     Comments: No midline spine tenderness, no midline spine step-off  Skin:    General: Skin is warm and dry.  Neurological:     Mental Status: She is alert.      Assessment/Plan: Please see individual problem list.  Orthostasis Assessment & Plan: Chronic issue.  Discussed referral to cardiology for help managing midodrine though given patient's age they preferred not to do that.  For now she will continue midodrine 2.5 mg 3 times daily.  Advised to not lay down for 3 to 4 hours after taking the midodrine.  Orders: -     Midodrine HCl; Take 1 tablet (2.5 mg total) by mouth 2 (two) times daily with a meal.  Dispense: 60 tablet; Refill: 1  Compression fracture of  L1 vertebra, initial encounter [facility]) Assessment & Plan: Patient without pain at this time.  She will monitor for pain.  Discussed falls precautions.  She will continue PT and OT.   Depression, major, single episode, mild (HCC) Assessment & Plan: Possibly with depression though some of her symptoms could just be related to dementia.  Discussed potentially treating with a antidepressant though her and her daughter are hesitant to do so.  They will think about this and let us know if they change their mind.     Return in about 3 months (around 12/14/2023) for Transfer of care with Dr. Clent Ridges.   Marikay Alar, MD [facility] Primary Care [facility]

## 2023-09-15 NOTE — Assessment & Plan Note (Signed)
Possibly with depression though some of her symptoms could just be related to dementia.  Discussed potentially treating with a antidepressant though her and her daughter are hesitant to do so.  They will think about this and let us know if they change their mind.

## 2023-09-15 NOTE — Patient Instructions (Signed)
Nice to see you. I have refilled your midodrine.  Please do not lay down for 3 to 4 hours after taking the midodrine.

## 2023-12-15 ENCOUNTER — Ambulatory Visit: Payer: PPO | Admitting: Family Medicine

## 2023-12-16 ENCOUNTER — Ambulatory Visit: Payer: PPO | Admitting: Family Medicine

## 2023-12-16 ENCOUNTER — Telehealth: Payer: Self-pay | Admitting: Family Medicine

## 2023-12-16 NOTE — Telephone Encounter (Signed)
 Dr Clent Ridges is out of the office today,12/16/2023. Your appointment needs to be rescheduled. Please call the office at [phone].  Thank you.

## 2024-01-03 ENCOUNTER — Telehealth: Payer: Self-pay | Admitting: Family Medicine

## 2024-01-03 NOTE — Telephone Encounter (Signed)
 Dr Clent Ridges is leaving the practice and your New Patient or Transfer of Care appointment needs to be rescheduled with another provider. Please call the office to schedule a Transfer of Care to either Dr Charlann Lange, Darleen Crocker or Kara Dies, NP.   Thank you  E2C2, please reschedule this patient's TOC visit. [facility]

## 2024-01-12 ENCOUNTER — Encounter: Payer: Self-pay | Admitting: Family Medicine

## 2024-01-12 ENCOUNTER — Ambulatory Visit (INDEPENDENT_AMBULATORY_CARE_PROVIDER_SITE_OTHER): Admitting: Family Medicine

## 2024-01-12 VITALS — BP 121/71 | HR 63 | Temp 97.6°F | Resp 20 | Ht 66.0 in | Wt 109.4 lb

## 2024-01-12 DIAGNOSIS — R413 Other amnesia: Secondary | ICD-10-CM

## 2024-01-12 DIAGNOSIS — E538 Deficiency of other specified B group vitamins: Secondary | ICD-10-CM

## 2024-01-12 DIAGNOSIS — H9193 Unspecified hearing loss, bilateral: Secondary | ICD-10-CM

## 2024-01-12 DIAGNOSIS — Z711 Person with feared health complaint in whom no diagnosis is made: Secondary | ICD-10-CM | POA: Diagnosis not present

## 2024-01-12 DIAGNOSIS — E559 Vitamin D deficiency, unspecified: Secondary | ICD-10-CM | POA: Diagnosis not present

## 2024-01-12 DIAGNOSIS — E46 Unspecified protein-calorie malnutrition: Secondary | ICD-10-CM | POA: Insufficient documentation

## 2024-01-12 DIAGNOSIS — E441 Mild protein-calorie malnutrition: Secondary | ICD-10-CM | POA: Diagnosis not present

## 2024-01-12 DIAGNOSIS — C8581 Other specified types of non-Hodgkin lymphoma, lymph nodes of head, face, and neck: Secondary | ICD-10-CM

## 2024-01-12 DIAGNOSIS — I951 Orthostatic hypotension: Secondary | ICD-10-CM

## 2024-01-12 LAB — COMPREHENSIVE METABOLIC PANEL WITH GFR
ALT: 11 U/L (ref 0–35)
AST: 16 U/L (ref 0–37)
Albumin: 4 g/dL (ref 3.5–5.2)
Alkaline Phosphatase: 94 U/L (ref 39–117)
BUN: 34 mg/dL — ABNORMAL HIGH (ref 6–23)
CO2: 28 meq/L (ref 19–32)
Calcium: 9.1 mg/dL (ref 8.4–10.5)
Chloride: 106 meq/L (ref 96–112)
Creatinine, Ser: 1.14 mg/dL (ref 0.40–1.20)
GFR: 40 mL/min — ABNORMAL LOW (ref >60.00)
Glucose, Bld: 98 mg/dL (ref 70–99)
Potassium: 4.3 meq/L (ref 3.5–5.1)
Sodium: 141 meq/L (ref 135–145)
Total Bilirubin: 0.4 mg/dL (ref 0.2–1.2)
Total Protein: 6.5 g/dL (ref 6.0–8.3)

## 2024-01-12 LAB — IBC + FERRITIN
Ferritin: 39.2 ng/mL (ref 10.0–291.0)
Iron: 77 ug/dL (ref 42–145)
Saturation Ratios: 23.9 % (ref 20.0–50.0)
TIBC: 322 ug/dL (ref 250.0–450.0)
Transferrin: 230 mg/dL (ref 212.0–360.0)

## 2024-01-12 LAB — CBC WITH DIFFERENTIAL/PLATELET
Basophils Absolute: 0 10*3/uL (ref 0.0–0.1)
Basophils Relative: 0.6 % (ref 0.0–3.0)
Eosinophils Absolute: 0.1 10*3/uL (ref 0.0–0.7)
Eosinophils Relative: 1.9 % (ref 0.0–5.0)
HCT: 38 % (ref 36.0–46.0)
Hemoglobin: 12.6 g/dL (ref 12.0–15.0)
Lymphocytes Relative: 21 % (ref 12.0–46.0)
Lymphs Abs: 1 10*3/uL (ref 0.7–4.0)
MCHC: 33.1 g/dL (ref 30.0–36.0)
MCV: 92.6 fl (ref 78.0–100.0)
Monocytes Absolute: 0.4 10*3/uL (ref 0.1–1.0)
Monocytes Relative: 8.5 % (ref 3.0–12.0)
Neutro Abs: 3.1 10*3/uL (ref 1.4–7.7)
Neutrophils Relative %: 68 % (ref 43.0–77.0)
Platelets: 181 10*3/uL (ref 150.0–400.0)
RBC: 4.1 Mil/uL (ref 3.87–5.11)
RDW: 13.7 % (ref 11.5–15.5)
WBC: 4.6 10*3/uL (ref 4.0–10.5)

## 2024-01-12 LAB — VITAMIN D 25 HYDROXY (VIT D DEFICIENCY, FRACTURES): VITD: 21.37 ng/mL — ABNORMAL LOW (ref 30.00–100.00)

## 2024-01-12 LAB — TSH: TSH: 3.11 u[IU]/mL (ref 0.35–5.50)

## 2024-01-12 LAB — VITAMIN B12: Vitamin B-12: 109 pg/mL — ABNORMAL LOW (ref 211–911)

## 2024-01-12 NOTE — Progress Notes (Signed)
 SUBJECTIVE:   Chief Complaint  Patient presents with   Medical Management of Chronic Issues    3 month follow    HPI Presents for chronic disease management  Discussed the use of AI scribe software for clinical note transcription with the patient, who gave verbal consent to proceed.  History of Present Illness Lisa Sosa is a 89 year old female who presents for follow-up. She is accompanied by her caregiver. She was referred by Dr. Jacalyn Lefevre for follow-up regarding transfer of care.  She is experiencing cognitive decline with increasing difficulty in memory and confusion over the past year. She requires multiple prompts to complete tasks such as eating and getting ready. She often forgets recent instructions, such as where she is going or what she needs to do next. Long-term memory is somewhat preserved, as she can recall her birthday, but overall memory has declined. Her daughter recently passed away, but she has not shown significant emotional response or recognition of this event.  She has significant hearing loss, with no hearing in her left ear for many years and a change in hearing in her right ear. Attempts to use hearing aids have been unsuccessful due to the loss of three sets of partials.  She has a history of orthostatic hypotension, which has led to falls. She was started on midodrine during a hospital stay for a compression fracture and continues to take it once a day. She experiences low blood pressure and has had one fall since the fracture.  She had a compression fracture of L1 last fall, which has affected her mobility. She is less mobile and sits most of the time, which has further impacted her ability to move around. She does not engage in exercises independently and requires assistance.  She has a past medical history of lymphoma diagnosed in 2016, with clear CT scans since then and no recent follow-up needed according to her oncologist.    PERTINENT PMH /  PSH: As above  OBJECTIVE:  BP 121/71   Pulse 63   Temp 97.6 F (36.4 C)   Resp 20   Ht 5\' 6"  (1.676 m)   Wt 109 lb 6 oz (49.6 kg)   SpO2 100%   BMI 17.65 kg/m    Physical Exam Vitals reviewed.  Constitutional:      General: She is not in acute distress.    Appearance: She is not ill-appearing.  HENT:     Head: Normocephalic.     Right Ear: Tympanic membrane, ear canal and external ear normal.     Left Ear: Tympanic membrane, ear canal and external ear normal.     Nose: Nose normal.     Mouth/Throat:     Mouth: Mucous membranes are moist.  Eyes:     Extraocular Movements: Extraocular movements intact.     Conjunctiva/sclera: Conjunctivae normal.     Pupils: Pupils are equal, round, and reactive to light.  Neck:     Vascular: No carotid bruit.  Cardiovascular:     Rate and Rhythm: Normal rate and regular rhythm.     Pulses: Normal pulses.     Heart sounds: Normal heart sounds.  Pulmonary:     Effort: Pulmonary effort is normal.     Breath sounds: Normal breath sounds.  Abdominal:     General: Bowel sounds are normal. There is no distension.     Palpations: Abdomen is soft.     Tenderness: There is no abdominal tenderness. There is no right  CVA tenderness, left CVA tenderness, guarding or rebound.  Musculoskeletal:        General: Normal range of motion.     Cervical back: Normal range of motion.     Right lower leg: No edema.     Left lower leg: No edema.  Lymphadenopathy:     Cervical: No cervical adenopathy.  Skin:    Capillary Refill: Capillary refill takes less than 2 seconds.  Neurological:     Mental Status: She is alert. Mental status is at baseline.     Motor: No weakness.  Psychiatric:        Behavior: Behavior normal. Behavior is cooperative.        Cognition and Memory: Memory is impaired. She exhibits impaired remote memory.           09/15/2023    3:15 PM 01/28/2023    1:43 PM 09/14/2022    2:31 PM 03/13/2022    2:36 PM 12/10/2021   12:21 PM   Depression screen PHQ 2/9  Decreased Interest 0 0 3 0 0  Down, Depressed, Hopeless 0 1 2 0 0  PHQ - 2 Score 0 1 5 0 0  Altered sleeping 0      Tired, decreased energy 0      Change in appetite 0      Feeling bad or failure about yourself  0      Trouble concentrating 0      Moving slowly or fidgety/restless 0      Suicidal thoughts 0      PHQ-9 Score 0      Difficult doing work/chores Not difficult at all          09/15/2023    3:15 PM  GAD 7 : Generalized Anxiety Score  Nervous, Anxious, on Edge 0  Control/stop worrying 0  Worry too much - different things 0  Trouble relaxing 0  Restless 0  Easily annoyed or irritable 0  Afraid - awful might happen 0  Total GAD 7 Score 0  Anxiety Difficulty Not difficult at all    ASSESSMENT/PLAN:  Concern about memory Assessment & Plan: Exhibits signs of cognitive decline over the past year, suggesting possible dementia or age-related decline. Gradual decline with no acute changes or recent falls. Medications like Namenda and Aricept may not be beneficial. - Check labs today - Consider CT head if acute changes or further decline.  Orders: -     TSH  Mild protein-calorie malnutrition (HCC) Assessment & Plan: Issues with eating and drinking affecting overall health and nutrition. Discussed using Boost or Ensure drinks to increase caloric intake. Emphasized importance of monitoring bowel movements to prevent constipation, as it can contribute to confusion. - Encourage intake of Boost or Ensure drinks to increase caloric intake. - Monitor bowel movements to prevent constipation.  Orders: -     Comprehensive metabolic panel with GFR -     CBC with Differential/Platelet -     TSH -     IBC + Ferritin  Vitamin D deficiency -     VITAMIN D 25 Hydroxy (Vit-D Deficiency, Fractures)  Vitamin B 12 deficiency -     Vitamin B12  Memory difficulty Assessment & Plan: Exhibits signs of cognitive decline over the past year, suggesting  possible dementia or age-related decline. Gradual decline with no acute changes or recent falls. Medications like Namenda and Aricept may not be beneficial. - Check labs today - Consider CT head if acute changes or further decline.  Orthostasis Assessment & Plan: Managed with midodrine initiated during a hospital stay for a compression fracture. Experienced falls related to this condition. Discussed side effects of midodrine and the need to remain upright for four hours post-dose. - Continue midodrine once daily in the morning. - Monitor for side effects and ensure she remains upright for four hours after taking midodrine.  Orders: -     Midodrine HCl; Take 1 tablet (2.5 mg total) by mouth 2 (two) times daily with a meal.  Dispense: 60 tablet; Refill: 1  Hearing difficulty of both ears Assessment & Plan: Longstanding hearing loss in the left ear and a change in hearing in the right ear. Hearing aids not pursued due to issues with partials disappearing.   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 Follow-up CT scans have been clear, and the oncologist indicated no need for further follow-up, per patients family      PDMP reviewed  Return for PCP.  Dana Allan, MD

## 2024-01-12 NOTE — Patient Instructions (Addendum)
 It was a pleasure meeting you today. Thank you for allowing me to take part in your health care.  Our goals for today as we discussed include:  We will get some labs today.  If they are abnormal or we need to do something about them, I will call you.  If they are normal, I will send you a message on MyChart (if it is active) or a letter in the mail.  If you don't hear from [place] in 2 weeks, please call the office at the number below.   Recommend Boost or Ensure to increase caloric intake   Follow up for TOC    This is a list of the screening recommended for you and due dates:  Health Maintenance  Topic Date Due   DTaP/Tdap/Td vaccine (1 - Tdap) Never done   Zoster (Shingles) Vaccine (1 of 2) Never done   Medicare Annual Wellness Visit  01/28/2024   Pneumonia Vaccine (2 of 2 - PPSV23 or PCV20) 01/28/2024*   DEXA scan (bone density measurement)  01/28/2024*   COVID-19 Vaccine (6 - Mixed Product risk 2024-25 season) 02/21/2024   Flu Shot  05/12/2024   HPV Vaccine  Aged Out  *Topic was postponed. The date shown is not the original due date.      If you have any questions or concerns, please do not hesitate to call the office at [phone].  I look forward to our next visit and until then take care and stay safe.  Regards,   Dana Allan, MD   [facility]

## 2024-01-13 ENCOUNTER — Other Ambulatory Visit: Payer: Self-pay | Admitting: Family Medicine

## 2024-01-13 DIAGNOSIS — E559 Vitamin D deficiency, unspecified: Secondary | ICD-10-CM

## 2024-01-13 DIAGNOSIS — E538 Deficiency of other specified B group vitamins: Secondary | ICD-10-CM

## 2024-01-13 MED ORDER — CYANOCOBALAMIN 1000 MCG/ML IJ SOLN
1000.0000 ug | INTRAMUSCULAR | Status: DC
Start: 2024-01-13 — End: 2024-01-17

## 2024-01-13 MED ORDER — VITAMIN D (ERGOCALCIFEROL) 1.25 MG (50000 UNIT) PO CAPS
50000.0000 [IU] | ORAL_CAPSULE | ORAL | 1 refills | Status: DC
Start: 2024-01-13 — End: 2024-08-31

## 2024-01-16 ENCOUNTER — Encounter: Payer: Self-pay | Admitting: Family Medicine

## 2024-01-16 DIAGNOSIS — E559 Vitamin D deficiency, unspecified: Secondary | ICD-10-CM | POA: Insufficient documentation

## 2024-01-16 DIAGNOSIS — E538 Deficiency of other specified B group vitamins: Secondary | ICD-10-CM | POA: Insufficient documentation

## 2024-01-16 MED ORDER — MIDODRINE HCL 2.5 MG PO TABS
2.5000 mg | ORAL_TABLET | Freq: Two times a day (BID) | ORAL | 1 refills | Status: DC
Start: 2024-01-16 — End: 2024-08-30

## 2024-01-16 NOTE — Assessment & Plan Note (Signed)
 Exhibits signs of cognitive decline over the past year, suggesting possible dementia or age-related decline. Gradual decline with no acute changes or recent falls. Medications like Namenda and Aricept may not be beneficial. - Check labs today - Consider CT head if acute changes or further decline.

## 2024-01-16 NOTE — Assessment & Plan Note (Signed)
 Managed with midodrine initiated during a hospital stay for a compression fracture. Experienced falls related to this condition. Discussed side effects of midodrine and the need to remain upright for four hours post-dose. - Continue midodrine once daily in the morning. - Monitor for side effects and ensure she remains upright for four hours after taking midodrine.

## 2024-01-16 NOTE — Assessment & Plan Note (Signed)
 Diagnosed in 2016. Follow-up CT scans have been clear, and the oncologist indicated no need for further follow-up, per patients family

## 2024-01-16 NOTE — Assessment & Plan Note (Signed)
 Longstanding hearing loss in the left ear and a change in hearing in the right ear. Hearing aids not pursued due to issues with partials disappearing.

## 2024-01-16 NOTE — Assessment & Plan Note (Signed)
 Issues with eating and drinking affecting overall health and nutrition. Discussed using Boost or Ensure drinks to increase caloric intake. Emphasized importance of monitoring bowel movements to prevent constipation, as it can contribute to confusion. - Encourage intake of Boost or Ensure drinks to increase caloric intake. - Monitor bowel movements to prevent constipation.

## 2024-01-17 ENCOUNTER — Other Ambulatory Visit: Payer: Self-pay | Admitting: Family Medicine

## 2024-01-17 DIAGNOSIS — E538 Deficiency of other specified B group vitamins: Secondary | ICD-10-CM

## 2024-01-17 MED ORDER — CYANOCOBALAMIN 1000 MCG/ML IJ SOLN: INTRAMUSCULAR | 1 refills | Status: DC

## 2024-01-18 ENCOUNTER — Other Ambulatory Visit: Payer: Self-pay

## 2024-01-18 ENCOUNTER — Telehealth: Payer: Self-pay

## 2024-01-18 DIAGNOSIS — E538 Deficiency of other specified B group vitamins: Secondary | ICD-10-CM

## 2024-01-18 MED ORDER — CYANOCOBALAMIN 1000 MCG/ML IJ SOLN
INTRAMUSCULAR | 1 refills | Status: DC
Start: 2024-01-18 — End: 2024-08-31

## 2024-01-18 NOTE — Telephone Encounter (Signed)
 Left message to return call to our office.  Sent Rx to [facility] in [place].

## 2024-01-18 NOTE — Telephone Encounter (Signed)
 Resent medication  was sent to wrong pharmacy.

## 2024-01-18 NOTE — Telephone Encounter (Signed)
 Copied from CRM [id number]. Topic: Clinical - Prescription Issue >> Jan 18, 2024  9:19 AM Gurney Maxin H wrote: Reason for CRM: Patients daughter calling to state patients medication cyanocobalamin (VITAMIN B12) 1000 MCG/ML injection was sent to the wrong pharmacy, medication sent to [facility] and should have been sent to [facility] on file. Please reach out to daughter, thanks.  [name]  [phone]

## 2024-04-05 ENCOUNTER — Encounter: Payer: PPO | Admitting: Family Medicine

## 2024-07-26 ENCOUNTER — Encounter

## 2024-07-26 DIAGNOSIS — R4189 Other symptoms and signs involving cognitive functions and awareness: Secondary | ICD-10-CM

## 2024-07-26 DIAGNOSIS — E441 Mild protein-calorie malnutrition: Secondary | ICD-10-CM

## 2024-07-26 DIAGNOSIS — I951 Orthostatic hypotension: Secondary | ICD-10-CM

## 2024-08-31 ENCOUNTER — Ambulatory Visit (INDEPENDENT_AMBULATORY_CARE_PROVIDER_SITE_OTHER)

## 2024-08-31 VITALS — BP 100/50 | HR 56 | Temp 98.2°F | Ht 66.0 in | Wt 105.6 lb

## 2024-08-31 DIAGNOSIS — R4189 Other symptoms and signs involving cognitive functions and awareness: Secondary | ICD-10-CM | POA: Diagnosis not present

## 2024-08-31 DIAGNOSIS — I951 Orthostatic hypotension: Secondary | ICD-10-CM | POA: Diagnosis not present

## 2024-08-31 DIAGNOSIS — E538 Deficiency of other specified B group vitamins: Secondary | ICD-10-CM

## 2024-08-31 DIAGNOSIS — M79662 Pain in left lower leg: Secondary | ICD-10-CM | POA: Diagnosis not present

## 2024-08-31 DIAGNOSIS — R5381 Other malaise: Secondary | ICD-10-CM | POA: Diagnosis not present

## 2024-08-31 DIAGNOSIS — E559 Vitamin D deficiency, unspecified: Secondary | ICD-10-CM

## 2024-08-31 DIAGNOSIS — R7303 Prediabetes: Secondary | ICD-10-CM

## 2024-08-31 DIAGNOSIS — E441 Mild protein-calorie malnutrition: Secondary | ICD-10-CM

## 2024-08-31 MED ORDER — MIDODRINE HCL 2.5 MG PO TABS: 2.5000 mg | ORAL_TABLET | Freq: Every day | ORAL | 1 refills | Status: AC

## 2024-08-31 NOTE — Assessment & Plan Note (Deleted)
 Lisa Sosa

## 2024-08-31 NOTE — Progress Notes (Signed)
 Established Patient Office Visit TOC from Dr. Hope    Subjective  Patient ID: Lisa Sosa, female    DOB: 03/23/1923  Age: 89 y.o. MRN: [id number]  Chief Complaint  Patient presents with   Establish Care   Leg Pain   Hypotension    Discussed the use of AI scribe software for clinical note transcription with the patient, who gave verbal consent to proceed.  History of Present Illness  Lisa Sosa is a 89 year old female who presents with her daughter/ PA Clancy for transfer of care from previous PCP.   She is experiencing challenges with her nutritional intake, with stable weight since the last visit but concerns about her appetite. She has difficulty drinking fluids, including water, and is encouraged to consume smaller, more frequent meals. She is currently taking an over-the-counter vitamin D  supplement.  There is a history of low vitamin B12 levels, which were checked earlier this year, raising concerns about its impact on her memory. She has not been receiving B12 injections due to her resistance, and there is consideration of using sublingual B12 tablets instead. Her memory has shown some changes, as she requires repeated prompts to complete tasks such as eating.  She has been experiencing foot pain, for which she was given Naprosyn this morning. She has been observed rubbing her foot and making sounds of discomfort. There is no noted swelling or recent trauma to the foot. Aspercreme was applied last night, and she was given Naprosyn again today.  She takes midodrine , initially prescribed twice a day, but currently receives it once a day due to her tendency to lie down frequently. Her sacrum has shown redness but no open wounds. Patient sustained L1 compression fracture in 2024 and was started on Midodrine  2.5 mg three times a day.   Her social interactions are limited, with occasional visits from her daughter and her daughter's boyfriend. She does not receive regular visits  from other family members despite their proximity. She relies on [facility] for all her daily care needs, including bathing and dressing.    ROS As per HPI    Objective:     BP (!) 100/50 (BP Location: Right Arm, Patient Position: Sitting, Cuff Size: Normal)   Pulse (!) 56   Temp 98.2 F (36.8 C) (Oral)   Ht 5' 6 (1.676 m)   Wt 105 lb 9.6 oz (47.9 kg)   SpO2 95%   BMI 17.04 kg/m      09/15/2023    3:15 PM 01/28/2023    1:43 PM 09/14/2022    2:31 PM  Depression screen PHQ 2/9  Decreased Interest 0 0 3  Down, Depressed, Hopeless 0 1 2  PHQ - 2 Score 0 1 5  Altered sleeping 0    Tired, decreased energy 0    Change in appetite 0    Feeling bad or failure about yourself  0    Trouble concentrating 0    Moving slowly or fidgety/restless 0    Suicidal thoughts 0    PHQ-9 Score 0     Difficult doing work/chores Not difficult at all       Data saved with a previous flowsheet row definition      09/15/2023    3:15 PM  GAD 7 : Generalized Anxiety Score  Nervous, Anxious, on Edge 0  Control/stop worrying 0  Worry too much - different things 0  Trouble relaxing 0  Restless 0  Easily annoyed or irritable 0  Afraid - awful might happen 0  Total GAD 7 Score 0  Anxiety Difficulty Not difficult at all      09/15/2023    3:15 PM 01/28/2023    1:43 PM 09/14/2022    2:31 PM  Depression screen PHQ 2/9  Decreased Interest 0 0 3  Down, Depressed, Hopeless 0 1 2  PHQ - 2 Score 0 1 5  Altered sleeping 0    Tired, decreased energy 0    Change in appetite 0    Feeling bad or failure about yourself  0    Trouble concentrating 0    Moving slowly or fidgety/restless 0    Suicidal thoughts 0    PHQ-9 Score 0     Difficult doing work/chores Not difficult at all       Data saved with a previous flowsheet row definition      09/15/2023    3:15 PM  GAD 7 : Generalized Anxiety Score  Nervous, Anxious, on Edge 0  Control/stop worrying 0  Worry too much - different things 0  Trouble  relaxing 0  Restless 0  Easily annoyed or irritable 0  Afraid - awful might happen 0  Total GAD 7 Score 0  Anxiety Difficulty Not difficult at all   SDOH Screenings   Food Insecurity: No Food Insecurity (01/28/2023)  Housing: Low Risk  (01/28/2023)  Transportation Needs: No Transportation Needs (01/28/2023)  Utilities: Not At Risk (01/28/2023)  Depression (PHQ2-9): Low Risk  (09/15/2023)  Financial Resource Strain: Low Risk  (01/28/2023)  Social Connections: Unknown (01/28/2023)  Stress: No Stress Concern Present (01/28/2023)  Tobacco Use: Low Risk  (08/31/2024)     Physical Exam Constitutional:      General: She is not in acute distress. HENT:     Head: Normocephalic and atraumatic.     Mouth/Throat:     Pharynx: Oropharynx is clear.     Comments: Poor dentition and partial edentulism noted. Cardiovascular:     Rate and Rhythm: Normal rate.  Pulmonary:     Effort: Pulmonary effort is normal.     Breath sounds: Normal breath sounds.  Abdominal:     Palpations: Abdomen is soft.     Tenderness: There is no abdominal tenderness. There is no guarding.  Musculoskeletal:     Cervical back: Neck supple.     Right lower leg: No edema.     Left lower leg: No edema.  Neurological:     Gait: Gait abnormal (uses a rollator for ambulation, slow gait, stooped posture with walking).  Psychiatric:        Behavior: Behavior is not agitated. Behavior is cooperative.     Comments: Patient is alert and cooperative. She is hard of hearing, communicates minimally, even with her family members, relies on gestures, and facial expression.         No results found for any visits on 08/31/24.  The ASCVD Risk score (Arnett DK, et al., 2019) failed to calculate for the following reasons:   The 2019 ASCVD risk score is only valid for ages 31 to 82     Assessment & Plan:   Assessment & Plan Physical deconditioning Limited mobility and decreased oral intake contribute to deconditioning. Family  considering home health assistance.  - Encouraged small, frequent meals. - Discussed potential home health referral pending insurance approval. - Encouraged social activities. - Recommend updating ACP paperwork which daughter has at home and plans on bringing it to our office to be uploaded to  her chart.  - POA prefers DNR and comfort care which was updated in patient's chart today.   Orders:   Do not attempt resuscitation (DNR)  Pain in left lower leg D/D arthritis, DVT, microfracture. No bruising, erythema on exam. Managed with naproxen, concern for gastrointestinal side effects. - Continue naproxen with food. - Apply diclofenac 1% gel up to four times daily. - Consider ultrasound if symptoms worsen. - Daughter prefers symptomatic management at this time instead of invasive measure.     Orthostasis Stable on Midodrine  2.5 mg once a day. Continue.  Orders:   midodrine  (PROAMATINE ) 2.5 MG tablet; Take 1 tablet (2.5 mg total) by mouth daily.  Concern about memory - Discussed age appropriate memory changes, benefit of neurology evaluation, invasive measures like updating her blood work today vs symptomatic management of chronic conditions. POA prefers symptomatic management and will reach out to us  if patient's cognition changes  from baseline. - Encouraged social interaction and routine activities. - Monitor for behavioral changes, consider neurology referral if needed.    Vitamin B 12 deficiency - Initiated OTC sublingual vitamin B12 1000 mcg supplementation. Offered repeat B12, daughter/POA prefers holding off on labs at this time which is reasonable.     Vitamin D  deficiency Take OTC vitamin D  2000 units daily.        Return in about 6 months (around 02/28/2025) for chronic follow up .   Luke Shade, MD

## 2024-08-31 NOTE — Assessment & Plan Note (Signed)
 D/D arthritis, DVT, microfracture. No bruising, erythema on exam. Managed with naproxen, concern for gastrointestinal side effects. - Continue naproxen with food. - Apply diclofenac 1% gel up to four times daily. - Consider ultrasound if symptoms worsen. - Daughter prefers symptomatic management at this time instead of invasive measure.

## 2024-08-31 NOTE — Assessment & Plan Note (Addendum)
-   Discussed age appropriate memory changes, benefit of neurology evaluation, invasive measures like updating her blood work today vs symptomatic management of chronic conditions. POA prefers symptomatic management and will reach out to us  if patient's cognition changes  from baseline. - Encouraged social interaction and routine activities. - Monitor for behavioral changes, consider neurology referral if needed.

## 2024-08-31 NOTE — Assessment & Plan Note (Deleted)
 SABRA

## 2024-08-31 NOTE — Assessment & Plan Note (Addendum)
 Limited mobility and decreased oral intake contribute to deconditioning. Family considering home health assistance.  - Encouraged small, frequent meals. - Discussed potential home health referral pending insurance approval. - Encouraged social activities. - Recommend updating ACP paperwork which daughter has at home and plans on bringing it to our office to be uploaded to her chart.  - POA prefers DNR and comfort care which was updated in patient's chart today.   Orders:   Do not attempt resuscitation (DNR)

## 2024-08-31 NOTE — Patient Instructions (Addendum)
 For help with memory:  External aids and routines: Use calendars, pill organizers, and consistent routines to support daily function. If interested in medication or pharmacological intervention recommend referral to neurology.    To improve appetite:  Small, frequent meals: Offer 5-6 small meals/snacks daily Nutrient-dense foods: Prioritize high-calorie, high-protein options (e.g. eggs, cheese, nut butters, avocados, fortified cereals).  Favorite foods: Serve preferred foods and flavors to boost enjoyment and intake.   If she develops left lower leg swelling, complains of chest pain, has shortness of breath, this would be concerning for PE and she will need evaluation in the ED.   Try Voltaren gel or diclofenac gel 1% up to 4 times a day on left lower leg to see if it helps with pain.   Please continue vitamin D  2000 units daily.  Try sublingual B12 1000 mcg over the counter.   If you feel need for home health please schedule an appointment sooner otherwise let's plan on follow up in 6 months.

## 2024-08-31 NOTE — Assessment & Plan Note (Signed)
-   Initiated OTC sublingual vitamin B12 1000 mcg supplementation. Offered repeat B12, daughter/POA prefers holding off on labs at this time which is reasonable.

## 2024-08-31 NOTE — Assessment & Plan Note (Signed)
 Take OTC vitamin D 2000 units daily

## 2024-08-31 NOTE — Assessment & Plan Note (Addendum)
 Stable on Midodrine  2.5 mg once a day. Continue.  Orders:   midodrine  (PROAMATINE ) 2.5 MG tablet; Take 1 tablet (2.5 mg total) by mouth daily.

## 2024-09-05 ENCOUNTER — Ambulatory Visit: Payer: Self-pay

## 2024-09-05 NOTE — Telephone Encounter (Signed)
 p

## 2024-09-05 NOTE — Telephone Encounter (Signed)
 Copied from CRM [id number]. Topic: Clinical - Red Word Triage >> Sep 05, 2024  8:39 AM Thersia BROCKS wrote: [facility] that prompted transfer to Nurse Triage: Patient daughter Clancy called stated the patients foot and ankle hurting , has been crying in pain needs to know what she needs to do not sure if needs a xray

## 2024-09-05 NOTE — Telephone Encounter (Signed)
 FYI Only or Action Required?: FYI only for provider: appointment scheduled on 09/06/2024.  Patient was last seen in primary care on 08/31/2024 by Abbey Bruckner, MD.  Called Nurse Triage reporting Pain.  Symptoms began x 2 weeks and worsening.  Interventions attempted: OTC medications: tylenol .  Symptoms are: gradually worsening.  Triage Disposition: See Physician Within 24 Hours  Patient/caregiver understands and will follow disposition?: Yes  Red Word that prompted transfer to Nurse Triage: Patient daughter Clancy called stated the patients foot and ankle hurting , has been crying in pain needs to know what she needs to do not sure if needs a xray    Copied from CRM #[id number]. Topic: Clinical - Red Word Triage >> Sep 05, 2024  8:39 AM Thersia BROCKS wrote: [facility] that prompted transfer to Nurse Triage: Patient daughter Clancy called stated the patients foot and ankle hurting , has been crying in pain needs to know what she needs to do not sure if needs a xray Reason for Disposition  [1] Painful rash AND [2] multiple small blisters grouped together (i.e., dermatomal distribution or band or stripe)  Answer Assessment - Initial Assessment Questions 1. ONSET: When did the pain start?      2 weeks and ongoing 2. LOCATION: Where is the pain located?      Left Foot and ankle 3. PAIN: How bad is the pain?    (Scale 1-10; or mild, moderate, severe)     Patient crying do to pain 4. WORK OR EXERCISE: Has there been any recent work or exercise that involved this part of the body?      na 5. CAUSE: What do you think is causing the leg pain?     unsure 6. OTHER SYMPTOMS: Do you have any other symptoms? (e.g., chest pain, back pain, breathing difficulty, swelling, rash, fever, numbness, weakness) Drags that foot 7. PREGNANCY: Is there any chance you are pregnant? When was your last menstrual period?     na  Protocols used: Leg Pain-A-AH

## 2024-09-06 ENCOUNTER — Encounter: Payer: Self-pay | Admitting: Internal Medicine

## 2024-09-06 ENCOUNTER — Ambulatory Visit (INDEPENDENT_AMBULATORY_CARE_PROVIDER_SITE_OTHER)

## 2024-09-06 ENCOUNTER — Ambulatory Visit (INDEPENDENT_AMBULATORY_CARE_PROVIDER_SITE_OTHER): Admitting: Internal Medicine

## 2024-09-06 VITALS — BP 100/60 | HR 63 | Ht 66.0 in | Wt 108.0 lb

## 2024-09-06 DIAGNOSIS — M25572 Pain in left ankle and joints of left foot: Secondary | ICD-10-CM

## 2024-09-06 DIAGNOSIS — M79675 Pain in left toe(s): Secondary | ICD-10-CM | POA: Diagnosis not present

## 2024-09-06 DIAGNOSIS — R5383 Other fatigue: Secondary | ICD-10-CM

## 2024-09-06 DIAGNOSIS — E559 Vitamin D deficiency, unspecified: Secondary | ICD-10-CM

## 2024-09-06 DIAGNOSIS — L03032 Cellulitis of left toe: Secondary | ICD-10-CM

## 2024-09-06 DIAGNOSIS — G8929 Other chronic pain: Secondary | ICD-10-CM

## 2024-09-06 MED ORDER — CEPHALEXIN 500 MG PO CAPS: 500.0000 mg | ORAL_CAPSULE | Freq: Four times a day (QID) | ORAL | 0 refills | Status: AC

## 2024-09-06 NOTE — Progress Notes (Signed)
 Subjective:  Patient ID: Lisa Sosa, female    DOB: January 23, 1923  Age: 89 y.o. MRN: [id number]  CC: The primary encounter diagnosis was Acute left ankle pain. Diagnoses of Pain in left toe(s), Other fatigue, Vitamin D  deficiency, Chronic pain of left ankle, and Paronychia of great toe, left were also pertinent to this visit.   HPI Lisa Sosa presents for  Chief Complaint  Patient presents with   Foot Pain   Ankle Pain   Lisa Sosa is a 70 yr od woman with dementia who is brought  in by her daughter  with worsening left ankle pain:  seen nov 20 for multiple issues,  ankle pain mentioned but not severe.  No workup done.  Since then patient has been unable to bear weight,  waking up crying.  Daughter has given her Tylneol #2 and aspercreme applied with no significant improvement. Patient unable to provide history and denies pain except with palpation  Hs been getting naprosyn 220 mg twice daily and tylenol     Outpatient Medications Prior to Visit  Medication Sig Dispense Refill   brimonidine  (ALPHAGAN ) 0.2 % ophthalmic solution Place 1 drop into both eyes 2 (two) times daily.     cholecalciferol (VITAMIN D3) 25 MCG (1000 UNIT) tablet Take 1,000 Units by mouth daily.     dorzolamide -timolol  (COSOPT ) 22.3-6.8 MG/ML ophthalmic solution 1 drop 2 (two) times daily.     latanoprost  (XALATAN ) 0.005 % ophthalmic solution Place 1 drop into both eyes at bedtime.     midodrine  (PROAMATINE ) 2.5 MG tablet Take 1 tablet (2.5 mg total) by mouth daily. 60 tablet 1   No facility-administered medications prior to visit.    Review of Systems;  Patient denies headache, fevers, malaise, unintentional weight loss, skin rash, eye pain, sinus congestion and sinus pain, sore throat, dysphagia,  hemoptysis , cough, dyspnea, wheezing, chest pain, palpitations, orthopnea, edema, abdominal pain, nausea, melena, diarrhea, constipation, flank pain, dysuria, hematuria, urinary  Frequency, nocturia, numbness, tingling,  seizures,  Focal weakness, Loss of consciousness,  Tremor, insomnia, depression, anxiety, and suicidal ideation.      Objective:  BP 100/60 (BP Location: Left Arm, Patient Position: Sitting, Cuff Size: Small)   Pulse 63   Ht 5' 6 (1.676 m)   Wt 108 lb (49 kg)   SpO2 99%   BMI 17.43 kg/m   BP Readings from Last 3 Encounters:  09/06/24 100/60  08/31/24 (!) 100/50  01/12/24 121/71    Wt Readings from Last 3 Encounters:  09/06/24 108 lb (49 kg)  08/31/24 105 lb 9.6 oz (47.9 kg)  01/12/24 109 lb 6 oz (49.6 kg)    Physical Exam Vitals reviewed.  Constitutional:      General: She is not in acute distress.    Appearance: Normal appearance. She is normal weight. She is not ill-appearing, toxic-appearing or diaphoretic.  HENT:     Head: Normocephalic.  Eyes:     General: No scleral icterus.       Right eye: No discharge.        Left eye: No discharge.     Conjunctiva/sclera: Conjunctivae normal.  Cardiovascular:     Rate and Rhythm: Normal rate and regular rhythm.     Heart sounds: Normal heart sounds.  Pulmonary:     Effort: Pulmonary effort is normal. No respiratory distress.     Breath sounds: Normal breath sounds.  Musculoskeletal:        General: Tenderness present. No swelling or deformity. Normal range  of motion.     Right lower leg: No bony tenderness. No edema.     Left lower leg: Tenderness and bony tenderness present. No edema.       Legs:  Skin:    General: Skin is warm and dry.  Neurological:     General: No focal deficit present.     Mental Status: She is alert and oriented to person, place, and time. Mental status is at baseline.  Psychiatric:        Mood and Affect: Mood normal.        Behavior: Behavior normal.        Thought Content: Thought content normal.        Judgment: Judgment normal.     Lab Results  Component Value Date   HGBA1C 5.7 05/13/2016   HGBA1C 5.8 02/10/2016   HGBA1C 6.1 11/13/2015    Lab Results  Component Value Date    CREATININE 1.25 (H) 09/06/2024   CREATININE 1.14 01/12/2024   CREATININE 1.13 (H) 08/04/2023    Lab Results  Component Value Date   WBC 5.7 09/06/2024   HGB 11.3 09/06/2024   HCT 35.7 09/06/2024   PLT 195 09/06/2024   GLUCOSE 102 (H) 09/06/2024   ALT 13 09/06/2024   AST 20 09/06/2024   NA 145 (H) 09/06/2024   K 4.6 09/06/2024   CL 109 (H) 09/06/2024   CREATININE 1.25 (H) 09/06/2024   BUN 38 (H) 09/06/2024   CO2 24 09/06/2024   TSH 5.480 (H) 09/06/2024   HGBA1C 5.7 05/13/2016    DG Lumbar Spine 2-3 Views Result Date: 09/09/2023 CLINICAL DATA:  L1 compression fracture. EXAM: LUMBAR SPINE - 2 VIEW COMPARISON:  CT 07/29/2023. FINDINGS: Osseous structures are osteopenic. Alignment normal. Degenerative disc disease especially severe at L3-4. Anterior wedge compression deformity with 50% loss of height at the L1 level. IMPRESSION: Osteopenia. Degenerative changes. L1 compression deformity. Electronically Signed   By: Fonda Field M.D.   On: 09/09/2023 10:58    Assessment & Plan:  .Acute left ankle pain -     DG Ankle 2 Views Left; Future  Pain in left toe(s) -     DG Foot Complete Left; Future -     Uric acid -     CBC with Differential/Platelet -     Sedimentation rate -     Comprehensive metabolic panel with GFR  Other fatigue -     TSH  Vitamin D  deficiency -     VITAMIN D  25 Hydroxy (Vit-D Deficiency, Fractures)  Chronic pain of left ankle Assessment & Plan: Exam normal excpet for redness of toes 1 and 2 /  plain films of ankle orderd and treatment of paronychia    Paronychia of great toe, left Assessment & Plan: Suggested by redness of toe and nail dystrophy .  Treating with keflex     Other orders -     Cephalexin ; Take 1 capsule (500 mg total) by mouth 4 (four) times daily.  Dispense: 28 capsule; Refill: 0    Follow-up: No follow-ups on file.   Verneita LITTIE Kettering, MD

## 2024-09-06 NOTE — Patient Instructions (Signed)
   I am treating Lisa Sosa for paronychia (cuticle infection) while we get x rays to rule out fracture   If the labs and x rays are normal, this is likely ARTHRITIS AND WE CAN REFER TO PODIATRY IF NECESSARY

## 2024-09-07 LAB — COMPREHENSIVE METABOLIC PANEL WITH GFR
ALT: 13 IU/L (ref 0–32)
AST: 20 IU/L (ref 0–40)
Albumin: 4 g/dL (ref 3.6–4.6)
Alkaline Phosphatase: 66 IU/L (ref 48–129)
BUN/Creatinine Ratio: 30 — ABNORMAL HIGH (ref 12–28)
BUN: 38 mg/dL — ABNORMAL HIGH (ref 10–36)
Bilirubin Total: <0.2 mg/dL (ref 0.0–1.2)
CO2: 24 mmol/L (ref 20–29)
Calcium: 9.7 mg/dL (ref 8.7–10.3)
Chloride: 109 mmol/L — ABNORMAL HIGH (ref 96–106)
Creatinine, Ser: 1.25 mg/dL — ABNORMAL HIGH (ref 0.57–1.00)
Globulin, Total: 2.2 g/dL (ref 1.5–4.5)
Glucose: 102 mg/dL — ABNORMAL HIGH (ref 70–99)
Potassium: 4.6 mmol/L (ref 3.5–5.2)
Sodium: 145 mmol/L — ABNORMAL HIGH (ref 134–144)
Total Protein: 6.2 g/dL (ref 6.0–8.5)
eGFR: 39 mL/min/1.73 — ABNORMAL LOW (ref >59)

## 2024-09-07 LAB — CBC WITH DIFFERENTIAL/PLATELET
Basophils Absolute: 0 x10E3/uL (ref 0.0–0.2)
Basos: 1 %
EOS (ABSOLUTE): 0.1 x10E3/uL (ref 0.0–0.4)
Eos: 3 %
Hematocrit: 35.7 % (ref 34.0–46.6)
Hemoglobin: 11.3 g/dL (ref 11.1–15.9)
Immature Grans (Abs): 0 x10E3/uL (ref 0.0–0.1)
Immature Granulocytes: 0 %
Lymphocytes Absolute: 1.3 x10E3/uL (ref 0.7–3.1)
Lymphs: 22 %
MCH: 30.8 pg (ref 26.6–33.0)
MCHC: 31.7 g/dL (ref 31.5–35.7)
MCV: 97 fL (ref 79–97)
Monocytes Absolute: 0.5 x10E3/uL (ref 0.1–0.9)
Monocytes: 9 %
Neutrophils Absolute: 3.7 x10E3/uL (ref 1.4–7.0)
Neutrophils: 65 %
Platelets: 195 x10E3/uL (ref 150–450)
RBC: 3.67 x10E6/uL — ABNORMAL LOW (ref 3.77–5.28)
RDW: 12.9 % (ref 11.7–15.4)
WBC: 5.7 x10E3/uL (ref 3.4–10.8)

## 2024-09-07 LAB — TSH: TSH: 5.48 u[IU]/mL — ABNORMAL HIGH (ref 0.450–4.500)

## 2024-09-07 LAB — URIC ACID: Uric Acid: 6 mg/dL (ref 3.1–7.9)

## 2024-09-07 LAB — VITAMIN D 25 HYDROXY (VIT D DEFICIENCY, FRACTURES): Vit D, 25-Hydroxy: 47.6 ng/mL (ref 30.0–100.0)

## 2024-09-07 LAB — SEDIMENTATION RATE: Sed Rate: 6 mm/h (ref 0–40)

## 2024-09-08 DIAGNOSIS — G8929 Other chronic pain: Secondary | ICD-10-CM | POA: Insufficient documentation

## 2024-09-08 DIAGNOSIS — L03032 Cellulitis of left toe: Secondary | ICD-10-CM | POA: Insufficient documentation

## 2024-09-08 NOTE — Assessment & Plan Note (Signed)
 Suggested by redness of toe and nail dystrophy .  Treating with keflex 

## 2024-09-08 NOTE — Assessment & Plan Note (Signed)
 Exam normal excpet for redness of toes 1 and 2 /  plain films of ankle orderd and treatment of paronychia

## 2024-09-09 ENCOUNTER — Ambulatory Visit: Payer: Self-pay | Admitting: Internal Medicine

## 2024-09-11 ENCOUNTER — Ambulatory Visit: Payer: Self-pay

## 2024-09-11 NOTE — Telephone Encounter (Signed)
 FYI Only or Action Required?: FYI only for provider: daughter declined appt at this time d/t transport for mother. .  Patient was last seen in primary care on 09/06/2024 by Marylynn Verneita CROME, MD.  Called Nurse Triage reporting Ankle Pain.  Symptoms began several weeks ago.  Interventions attempted: Rest, hydration, or home remedies.  Symptoms are: gradually worsening.  Triage Disposition: See PCP When Office is Open (Within 3 Days)  Patient/caregiver understands and will follow disposition?: No, wishes to speak with PCP  Copied from CRM #[id number]. Topic: Clinical - Red Word Triage >> Sep 11, 2024  3:38 PM Dedra B wrote: [facility] that prompted transfer to Nurse Triage: Pt daughter, Clancy, said pt is experiencing ankle pain. Aleve is not helping. Warm transfer to NT Reason for Disposition  [1] MODERATE pain (e.g., interferes with normal activities, limping) AND [2] present > 3 days  Answer Assessment - Initial Assessment Questions 1. ONSET: When did the pain start?      Overall this has been ongoing for about 2 weeks 2. LOCATION: Where is the pain located?      L side 3. PAIN: How bad is the pain?  (Scale 1-10; or mild, moderate, severe)     Crying,  4. WORK OR EXERCISE: Has there been any recent work or exercise that involved this part of the body?      denies 5. CAUSE: What do you think is causing the ankle pain?     Unsure, was evaluated for this 6. OTHER SYMPTOMS: Do you have any other symptoms? (e.g., calf pain, rash, fever, swelling)     Denies swelling, denies redness,   Aleve is not helping. Pt taking abx as prescribed. POA asking about xray read. POA wondering what else can be done, pt is to hard to transport, declined appt at this time.  Protocols used: Ankle Pain-A-AH

## 2024-09-11 NOTE — Telephone Encounter (Signed)
 Copied from CRM #[id number]. Topic: Clinical - Lab/Test Results >> Sep 11, 2024  3:36 PM Dedra B wrote: Reason for CRM: Pt daughter, Clancy, calling regarding pt's x-ray results for ankle.

## 2024-09-11 NOTE — Telephone Encounter (Signed)
 Please let the patient's POA know:   I reviewed the x-ray result of left foot which showed mild swelling around your big toe and second toe without fracture or signs of serious bone infection at this time. We are still waiting on left ankle x-ray results. I would recommend resting, elevating the left foot as much as possible. Ice packs can help with swelling. Ice pack for about 20 minutes a day for 2-3 times a day.  Finish the antibiotic that was prescribed to her by Dr. Marylynn. She can also take Tylenol  500 mg every 8 hourly as needed for pain. We can refer her to palliative care for pain management if she continues to have pain.   Luke Shade, MD

## 2024-09-12 ENCOUNTER — Other Ambulatory Visit: Payer: Self-pay | Admitting: Internal Medicine

## 2024-09-12 ENCOUNTER — Ambulatory Visit: Payer: Self-pay

## 2024-09-12 MED ORDER — PREDNISONE 10 MG PO TABS: ORAL_TABLET | ORAL | 0 refills | Status: AC

## 2024-09-12 MED ORDER — TRAMADOL HCL 50 MG PO TABS: 50.0000 mg | ORAL_TABLET | Freq: Four times a day (QID) | ORAL | 0 refills | Status: AC | PRN

## 2024-09-12 NOTE — Telephone Encounter (Signed)
 FYI Only or Action Required?: Action required by provider: medication refill request and clinical question for provider.  Patient was last seen in primary care on 09/06/2024 by Marylynn Verneita CROME, MD.  Called Nurse Triage reporting Ankle Pain.  Symptoms began several days ago.  Interventions attempted: OTC medications: aleve and tylenol .  Symptoms are: unchanged.  Triage Disposition: See PCP When Office is Open (Within 3 Days)  Patient/caregiver understands and will follow disposition?: No, wishes to speak with PCP  Copied from CRM #[id number]. Topic: Clinical - Red Word Triage >> Sep 12, 2024  1:45 PM Rea ORN wrote: Red Word that prompted transfer to Nurse Triage: Ankle pain Reason for Disposition  [1] MODERATE pain (e.g., interferes with normal activities, limping) AND [2] present > 3 days  Answer Assessment - Initial Assessment Questions Patient's daughter is requesting pain medication.  Reports Aleve and Tylenol  not working, can get something else for pain doesn't allow ice packs, does not have any swelling to foot or ankle.  Advised call back or ED if symptoms worsen. Pt's daughter verbalized understanding.  This RN read Dr. Graylon recommendations: Please let the patient's POA know:    I reviewed the x-ray result of left foot which showed mild swelling around your big toe and second toe without fracture or signs of serious bone infection at this time. We are still waiting on left ankle x-ray results. I would recommend resting, elevating the left foot as much as possible. Ice packs can help with swelling. Ice pack for about 20 minutes a day for 2-3 times a day.  Finish the antibiotic that was prescribed to her by Dr. Marylynn. She can also take Tylenol  500 mg every 8 hourly as needed for pain. We can refer her to palliative care for pain management if she continues to have pain.       1. ONSET: When did the pain start?      Weeks ago 2. LOCATION: Where is the pain located?       Left ankle, swelling, denies redness 3. PAIN: How bad is the pain?  (Scale 1-10; or mild, moderate, severe) severe 6. OTHER SYMPTOMS: Do you have any other symptoms? (e.g., calf pain, rash, fever, swelling)     denies  Protocols used: Ankle Pain-A-AH

## 2024-09-12 NOTE — Telephone Encounter (Signed)
 Ankle xray has been resulted pt's daughter is requesting results.

## 2024-09-12 NOTE — Telephone Encounter (Signed)
 Yes. Everything is documented in the result note.

## 2024-09-12 NOTE — Telephone Encounter (Signed)
 Spoke with pt's daughter and informed her of the ankle xray results and the 2 medications that were sent in.

## 2024-09-12 NOTE — Telephone Encounter (Addendum)
 Spoke with pt's daughter and she stated that pt has been crying in pain. Daughter stated that the tylenol  and aleve are not helping and would like to know if something else could be called in for the pain.   I have also called the reading room and they are expediting the ankle films. She was not sure why they were not read at the same time.

## 2025-02-28 ENCOUNTER — Ambulatory Visit
